# Patient Record
Sex: Female | Born: 1995 | Hispanic: Yes | Marital: Single | Smoking: Never smoker
Health system: Southern US, Community
[De-identification: ages and names within clinical notes are randomized; demographics above are authoritative.]

## PROBLEM LIST (undated history)

## (undated) DIAGNOSIS — E559 Vitamin D deficiency, unspecified: Secondary | ICD-10-CM

## (undated) DIAGNOSIS — R519 Headache, unspecified: Secondary | ICD-10-CM

## (undated) DIAGNOSIS — R7303 Prediabetes: Secondary | ICD-10-CM

## (undated) DIAGNOSIS — I10 Essential (primary) hypertension: Secondary | ICD-10-CM

## (undated) DIAGNOSIS — E7800 Pure hypercholesterolemia, unspecified: Secondary | ICD-10-CM

## (undated) DIAGNOSIS — Z789 Other specified health status: Secondary | ICD-10-CM

## (undated) DIAGNOSIS — K760 Fatty (change of) liver, not elsewhere classified: Secondary | ICD-10-CM

## (undated) DIAGNOSIS — D649 Anemia, unspecified: Secondary | ICD-10-CM

## (undated) HISTORY — PX: NO PAST SURGERIES: SHX2092

## (undated) HISTORY — DX: Anemia, unspecified: D64.9

## (undated) HISTORY — DX: Fatty (change of) liver, not elsewhere classified: K76.0

## (undated) HISTORY — DX: Prediabetes: R73.03

## (undated) HISTORY — DX: Pure hypercholesterolemia, unspecified: E78.00

## (undated) HISTORY — DX: Vitamin D deficiency, unspecified: E55.9

## (undated) HISTORY — DX: Other specified health status: Z78.9

## (undated) HISTORY — PX: TYMPANOSTOMY TUBE PLACEMENT: SHX32

---

## 2018-03-22 ENCOUNTER — Telehealth: Payer: Self-pay | Admitting: General Practice

## 2018-03-22 DIAGNOSIS — O3680X0 Pregnancy with inconclusive fetal viability, not applicable or unspecified: Secondary | ICD-10-CM

## 2018-03-22 NOTE — Telephone Encounter (Signed)
Claris Gower from [facility] called and left message on nurse voicemail line stating the patient came in today for a UPT, which was positive. LMP 01/14/18. She was unable to visualize anything on ultrasound. The patient has not had bleeding/pain. She is calling to schedule an ultrasound appt for the patient.   Called patient, no answer- left message to call us back.   Patient returned call & ultrasound was scheduled for 2/5 @ 2pm. Patient informed & had no questions. Patient denies pain or bleeding.

## 2018-03-22 NOTE — Telephone Encounter (Signed)
Opened in error

## 2018-03-24 ENCOUNTER — Ambulatory Visit (INDEPENDENT_AMBULATORY_CARE_PROVIDER_SITE_OTHER): Payer: PRIVATE HEALTH INSURANCE | Admitting: *Deleted

## 2018-03-24 ENCOUNTER — Ambulatory Visit (HOSPITAL_COMMUNITY)
Admission: RE | Admit: 2018-03-24 | Discharge: 2018-03-24 | Disposition: A | Discharge status: Home / Self Care | Payer: 59 | Source: Ambulatory Visit | Attending: Medical | Admitting: Medical

## 2018-03-24 DIAGNOSIS — O3680X0 Pregnancy with inconclusive fetal viability, not applicable or unspecified: Secondary | ICD-10-CM

## 2018-03-24 NOTE — Progress Notes (Signed)
Here for [place] results. States when she went to [facility] she forgot she also had had a period around 12/ 29/19 9 she had told them a November period). She denies pain or bleeding. Reviewed results with Dr. Jolayne Panther and notified patient US shows what appears to be very early pregnancy but we can not confirm it is a live pregnancy because we cannot see baby yet. Informed her we recommend non stat bhcg today to get a baseline and an [place] in 2 weeks. She will come to office afterwards for results. Ectopic precautions given.  Linda,RN

## 2018-03-24 NOTE — Progress Notes (Signed)
I have reviewed the chart and agree with nursing staff's documentation of this patient's encounter.  Catalina Antigua, MD 03/24/2018 4:29 PM

## 2018-03-25 ENCOUNTER — Telehealth: Payer: Self-pay | Admitting: *Deleted

## 2018-03-25 LAB — BETA HCG QUANT (REF LAB): hCG Quant: 822 m[IU]/mL

## 2018-03-25 NOTE — Telephone Encounter (Signed)
Baby left a message she is calling about blood test results.

## 2018-03-25 NOTE — Telephone Encounter (Signed)
I called Krystal Hudson back and informed her that her bhcg was 20 yesterday which may indicate very early pregnancy. We recommend she have [place] as scheduled in 2 weeks to see if we can see a live baby at that time. She voices understanding.

## 2018-04-07 ENCOUNTER — Ambulatory Visit (HOSPITAL_COMMUNITY): Admission: RE | Admit: 2018-04-07 | Payer: PRIVATE HEALTH INSURANCE | Source: Ambulatory Visit

## 2018-04-07 ENCOUNTER — Ambulatory Visit: Payer: PRIVATE HEALTH INSURANCE

## 2018-04-07 ENCOUNTER — Other Ambulatory Visit (HOSPITAL_COMMUNITY): Payer: Self-pay | Admitting: Obstetrics & Gynecology

## 2018-04-07 DIAGNOSIS — Z3200 Encounter for pregnancy test, result unknown: Secondary | ICD-10-CM | POA: Diagnosis not present

## 2018-04-07 DIAGNOSIS — Z3009 Encounter for other general counseling and advice on contraception: Secondary | ICD-10-CM | POA: Diagnosis not present

## 2018-04-29 ENCOUNTER — Encounter: Payer: Self-pay | Admitting: General Practice

## 2018-05-03 ENCOUNTER — Telehealth: Payer: Self-pay | Admitting: *Deleted

## 2018-05-03 ENCOUNTER — Other Ambulatory Visit (HOSPITAL_COMMUNITY)
Admission: RE | Admit: 2018-05-03 | Discharge: 2018-05-03 | Disposition: A | Discharge status: Home / Self Care | Payer: Medicaid Other | Source: Ambulatory Visit | Attending: Obstetrics and Gynecology | Admitting: Obstetrics and Gynecology

## 2018-05-03 ENCOUNTER — Other Ambulatory Visit: Payer: Self-pay

## 2018-05-03 ENCOUNTER — Ambulatory Visit (INDEPENDENT_AMBULATORY_CARE_PROVIDER_SITE_OTHER): Payer: PRIVATE HEALTH INSURANCE | Admitting: *Deleted

## 2018-05-03 DIAGNOSIS — Z3400 Encounter for supervision of normal first pregnancy, unspecified trimester: Secondary | ICD-10-CM | POA: Insufficient documentation

## 2018-05-03 NOTE — Progress Notes (Signed)
   PRENATAL INTAKE SUMMARY  [name] presents today New OB Nurse Interview.  OB History    Gravida  1   Para      Term      Preterm      AB      Living        SAB      TAB      Ectopic      Multiple      Live Births             I have reviewed the patient's medical, obstetrical, social, and family histories, medications, and available lab results.  SUBJECTIVE She has no unusual complaints.  OBJECTIVE Initial nurse interview for history and lab work (New OB).  EDD: 11/21/2018 by LMP GA:[redacted]w[redacted]d G1P0 FHT: 173  GENERAL APPEARANCE: alert, well appearing, in no apparent distress, oriented to person, place and time.   ASSESSMENT Normal pregnancy  PLAN Prenatal care-[facility]/HIV  OB Urine Culture GC/CT (urine) HgbEval/SMA/CF ([facility]) at next visit Panorama at next visit  A1C Glucose  Continue PNV Ultrasound <14 weeks to confirm dating  Clovis Pu, RN

## 2018-05-03 NOTE — Telephone Encounter (Signed)
Left voice message for patient informing her as part of the COVID-19 precautions, our office are only allowing patients to bring 1 person to their visit.  Clovis Pu, RN

## 2018-05-03 NOTE — Patient Instructions (Addendum)
First Trimester of Pregnancy  The first trimester of pregnancy is from week 1 until the end of week 13 (months 1 through 3). During this time, your baby will begin to develop inside you. At 6-8 weeks, the eyes and face are formed, and the heartbeat can be seen on ultrasound. At the end of 12 weeks, all the baby's organs are formed. Prenatal care is all the medical care you receive before the birth of your baby. Make sure you get good prenatal care and follow all of your doctor's instructions. Follow these instructions at home: Medicines  Take over-the-counter and prescription medicines only as told by your doctor. Some medicines are safe and some medicines are not safe during pregnancy.  Take a prenatal vitamin that contains at least 600 micrograms (mcg) of folic acid.  If you have trouble pooping (constipation), take medicine that will make your stool soft (stool softener) if your doctor approves. Eating and drinking   Eat regular, healthy meals.  Your doctor will tell you the amount of weight gain that is right for you.  Avoid raw meat and uncooked cheese.  If you feel sick to your stomach (nauseous) or throw up (vomit): ? Eat 4 or 5 small meals a day instead of 3 large meals. ? Try eating a few soda crackers. ? Drink liquids between meals instead of during meals.  To prevent constipation: ? Eat foods that are high in fiber, like fresh fruits and vegetables, whole grains, and beans. ? Drink enough fluids to keep your pee (urine) clear or pale yellow. Activity  Exercise only as told by your doctor. Stop exercising if you have cramps or pain in your lower belly (abdomen) or low back.  Do not exercise if it is too hot, too humid, or if you are in a place of great height (high altitude).  Try to avoid standing for long periods of time. Move your legs often if you must stand in one place for a long time.  Avoid heavy lifting.  Wear low-heeled shoes. Sit and stand up straight.   You can have sex unless your doctor tells you not to. Relieving pain and discomfort  Wear a good support bra if your breasts are sore.  Take warm water baths (sitz baths) to soothe pain or discomfort caused by hemorrhoids. Use hemorrhoid cream if your doctor says it is okay.  Rest with your legs raised if you have leg cramps or low back pain.  If you have puffy, bulging veins (varicose veins) in your legs: ? Wear support hose or compression stockings as told by your doctor. ? Raise (elevate) your feet for 15 minutes, 3-4 times a day. ? Limit salt in your food. Prenatal care  Schedule your prenatal visits by the twelfth week of pregnancy.  Write down your questions. Take them to your prenatal visits.  Keep all your prenatal visits as told by your doctor. This is important. Safety  Wear your seat belt at all times when driving.  Make a list of emergency phone numbers. The list should include numbers for family, friends, the hospital, and police and fire departments. General instructions  Ask your doctor for a referral to a local prenatal class. Begin classes no later than at the start of month 6 of your pregnancy.  Ask for help if you need counseling or if you need help with nutrition. Your doctor can give you advice or tell you where to go for help.  Do not use hot  tubs, steam rooms, or saunas.  Do not douche or use tampons or scented sanitary pads.  Do not cross your legs for long periods of time.  Avoid all herbs and alcohol. Avoid drugs that are not approved by your doctor.  Do not use any tobacco products, including cigarettes, chewing tobacco, and electronic cigarettes. If you need help quitting, ask your doctor. You may get counseling or other support to help you quit.  Avoid cat litter boxes and soil used by cats. These carry germs that can cause birth defects in the baby and can cause a loss of your baby (miscarriage) or stillbirth.  Visit your dentist. At home,  brush your teeth with a soft toothbrush. Be gentle when you floss. Contact a doctor if:  You are dizzy.  You have mild cramps or pressure in your lower belly.  You have a nagging pain in your belly area.  You continue to feel sick to your stomach, you throw up, or you have watery poop (diarrhea).  You have a bad smelling fluid coming from your vagina.  You have pain when you pee (urinate).  You have increased puffiness (swelling) in your face, hands, legs, or ankles. Get help right away if:  You have a fever.  You are leaking fluid from your vagina.  You have spotting or bleeding from your vagina.  You have very bad belly cramping or pain.  You gain or lose weight rapidly.  You throw up blood. It may look like coffee grounds.  You are around people who have [place] measles, fifth disease, or chickenpox.  You have a very bad headache.  You have shortness of breath.  You have any kind of trauma, such as from a fall or a car accident. Summary  The first trimester of pregnancy is from week 1 until the end of week 13 (months 1 through 3).  To take care of yourself and your unborn baby, you will need to eat healthy meals, take medicines only if your doctor tells you to do so, and do activities that are safe for you and your baby.  Keep all follow-up visits as told by your doctor. This is important as your doctor will have to ensure that your baby is healthy and growing well. This information is not intended to replace advice given to you by your health care provider. Make sure you discuss any questions you have with your health care provider. Document Released: 07/23/2007 Document Revised: 02/12/2016 Document Reviewed: 02/12/2016 Elsevier Interactive Patient Education  2019 [employer].  Warning Signs During Pregnancy A pregnancy lasts about 40 weeks, starting from the first day of your last period until the baby is born. Pregnancy is divided into three phases called  trimesters.  The first trimester refers to week 1 through week 13 of pregnancy.  The second trimester is the start of week 14 through the end of week 27.  The third trimester is the start of week 28 until you deliver your baby. During each trimester of pregnancy, certain signs and symptoms may indicate a problem. Talk with your health care provider about your current health and any medical conditions you have. Make sure you know the symptoms that you should watch for and report. How does this affect me?  Warning signs in the first trimester While some changes during the first trimester may be uncomfortable, most do not represent a serious problem. Let your health care provider know if you have any of the following warning signs in  the first trimester:  You cannot eat or drink without vomiting, and this lasts for longer than a day.  You have vaginal bleeding or spotting along with menstrual-like cramping.  You have diarrhea for longer than a day.  You have a fever or other signs of infection, such as: ? Pain or burning when you urinate. ? Foul smelling or thick or yellowish vaginal discharge. Warning signs in the second trimester As your baby grows and changes during the second trimester, there are additional signs and symptoms that may indicate a problem. These include:  Signs and symptoms of infection, including a fever.  Signs or symptoms of a miscarriage or preterm labor, such as regular contractions, menstrual-like cramping, or lower abdominal pain.  Bloody or watery vaginal discharge or obvious vaginal bleeding.  Feeling like your heart is pounding.  Having trouble breathing.  Nausea, vomiting, or diarrhea that lasts for longer than a day.  Craving non-food items, such as clay, chalk, or dirt. This may be a sign of a very treatable medical condition called pica. Later in your second trimester, watch for signs and symptoms of a serious medical condition called  preeclampsia.These include:  Changes in your vision.  A severe headache that does not go away.  Nausea and vomiting. It is also important to notice if your baby stops moving or moves less than usual during this time. Warning signs in the third trimester As you approach the third trimester, your baby is growing and your body is preparing for the birth of your baby. In your third trimester, be sure to let your health care provider know if:  You have signs and symptoms of infection, including a fever.  You have vaginal bleeding.  You notice that your baby is moving less than usual or is not moving.  You have nausea, vomiting, or diarrhea that lasts for longer than a day.  You have a severe headache that does not go away.  You have vision changes, including seeing spots or having blurry or double vision.  You have increased swelling in your hands or face. How does this affect my baby? Throughout your pregnancy, always report any of the warning signs of a problem to your health care provider. This can help prevent complications that may affect your baby, including:  Increased risk for premature birth.  Infection that may be transmitted to your baby.  Increased risk for stillbirth. Contact a health care provider if:  You have any of the warning signs of a problem for the current trimester of your pregnancy.  Any of the following apply to you during any trimester of pregnancy: ? You have strong emotions, such as sadness or anxiety, that interfere with work or personal relationships. ? You feel unsafe in your home and need help finding a safe place to live. ? You are using tobacco products, alcohol, or drugs and you need help to stop. Get help right away if: You have signs or symptoms of labor before 37 weeks of pregnancy. These include:  Contractions that are 5 minutes or less apart, or that increase in frequency, intensity, or length.  Sudden, sharp abdominal pain or low back  pain.  Uncontrolled gush or trickle of fluid from your vagina. Summary  A pregnancy lasts about 40 weeks, starting from the first day of your last period until the baby is born. Pregnancy is divided into three phases called trimesters. Each trimester has warning signs to watch for.  Always report any warning signs to your  health care provider in order to prevent complications that may affect both you and your baby.  Talk with your health care provider about your current health and any medical conditions you have. Make sure you know the symptoms that you should watch for and report. This information is not intended to replace advice given to you by your health care provider. Make sure you discuss any questions you have with your health care provider. Document Released: 11/20/2016 Document Revised: 11/20/2016 Document Reviewed: 11/20/2016 Elsevier Interactive Patient Education  2019 [employer]  

## 2018-05-04 LAB — OBSTETRIC PANEL, INCLUDING HIV
Antibody Screen: NEGATIVE
Basophils Absolute: 0 10*3/uL (ref 0.0–0.2)
Basos: 1 %
EOS (ABSOLUTE): 0.1 10*3/uL (ref 0.0–0.4)
Eos: 1 %
HIV Screen 4th Generation wRfx: NONREACTIVE
Hematocrit: 33.6 % — ABNORMAL LOW (ref 34.0–46.6)
Hemoglobin: 11.3 g/dL (ref 11.1–15.9)
Hepatitis B Surface Ag: NEGATIVE
Immature Grans (Abs): 0 10*3/uL (ref 0.0–0.1)
Immature Granulocytes: 1 %
Lymphocytes Absolute: 2 10*3/uL (ref 0.7–3.1)
Lymphs: 35 %
MCH: 28.1 pg (ref 26.6–33.0)
MCHC: 33.6 g/dL (ref 31.5–35.7)
MCV: 84 fL (ref 79–97)
Monocytes Absolute: 0.5 10*3/uL (ref 0.1–0.9)
Monocytes: 8 %
Neutrophils Absolute: 3 10*3/uL (ref 1.4–7.0)
Neutrophils: 54 %
PLATELETS: 288 10*3/uL (ref 150–450)
RBC: 4.02 x10E6/uL (ref 3.77–5.28)
RDW: 13.5 % (ref 11.7–15.4)
RPR: NONREACTIVE
Rh Factor: POSITIVE
Rubella Antibodies, IGG: 2.22 index (ref >0.99)
WBC: 5.5 10*3/uL (ref 3.4–10.8)

## 2018-05-04 LAB — HEMOGLOBIN A1C
Est. average glucose Bld gHb Est-mCnc: 111 mg/dL
Hgb A1c MFr Bld: 5.5 % (ref 4.8–5.6)

## 2018-05-04 LAB — URINE CYTOLOGY ANCILLARY ONLY
Chlamydia: NEGATIVE
NEISSERIA GONORRHEA: NEGATIVE

## 2018-05-04 LAB — GLUCOSE, RANDOM: Glucose: 75 mg/dL (ref 65–99)

## 2018-05-05 LAB — CULTURE, OB URINE

## 2018-05-05 LAB — URINE CULTURE, OB REFLEX

## 2018-05-10 ENCOUNTER — Other Ambulatory Visit (HOSPITAL_COMMUNITY): Payer: Self-pay | Admitting: Obstetrics and Gynecology

## 2018-05-10 ENCOUNTER — Other Ambulatory Visit: Payer: Self-pay

## 2018-05-10 ENCOUNTER — Ambulatory Visit (HOSPITAL_COMMUNITY)
Admission: RE | Admit: 2018-05-10 | Discharge: 2018-05-10 | Disposition: A | Discharge status: Home / Self Care | Payer: 59 | Source: Ambulatory Visit | Attending: Obstetrics and Gynecology | Admitting: Obstetrics and Gynecology

## 2018-05-10 DIAGNOSIS — Z3401 Encounter for supervision of normal first pregnancy, first trimester: Secondary | ICD-10-CM

## 2018-05-10 DIAGNOSIS — Z3400 Encounter for supervision of normal first pregnancy, unspecified trimester: Secondary | ICD-10-CM | POA: Diagnosis not present

## 2018-05-10 DIAGNOSIS — Z3A12 12 weeks gestation of pregnancy: Secondary | ICD-10-CM

## 2018-05-17 ENCOUNTER — Ambulatory Visit (INDEPENDENT_AMBULATORY_CARE_PROVIDER_SITE_OTHER): Payer: PRIVATE HEALTH INSURANCE | Admitting: Primary Care

## 2018-05-19 ENCOUNTER — Encounter: Payer: Self-pay | Admitting: Obstetrics and Gynecology

## 2018-05-19 ENCOUNTER — Other Ambulatory Visit (HOSPITAL_COMMUNITY)
Admission: RE | Admit: 2018-05-19 | Discharge: 2018-05-19 | Disposition: A | Discharge status: Home / Self Care | Payer: Medicaid Other | Source: Ambulatory Visit | Attending: Obstetrics and Gynecology | Admitting: Obstetrics and Gynecology

## 2018-05-19 ENCOUNTER — Ambulatory Visit (INDEPENDENT_AMBULATORY_CARE_PROVIDER_SITE_OTHER): Payer: 59 | Admitting: Obstetrics and Gynecology

## 2018-05-19 ENCOUNTER — Other Ambulatory Visit: Payer: Self-pay

## 2018-05-19 VITALS — BP 115/72 | HR 85 | Temp 98.0°F | Wt 253.0 lb

## 2018-05-19 DIAGNOSIS — Z3400 Encounter for supervision of normal first pregnancy, unspecified trimester: Secondary | ICD-10-CM

## 2018-05-19 DIAGNOSIS — Z3481 Encounter for supervision of other normal pregnancy, first trimester: Secondary | ICD-10-CM | POA: Diagnosis not present

## 2018-05-19 DIAGNOSIS — Z124 Encounter for screening for malignant neoplasm of cervix: Secondary | ICD-10-CM

## 2018-05-19 DIAGNOSIS — Z3A13 13 weeks gestation of pregnancy: Secondary | ICD-10-CM

## 2018-05-19 DIAGNOSIS — Z13228 Encounter for screening for other metabolic disorders: Secondary | ICD-10-CM | POA: Diagnosis not present

## 2018-05-19 DIAGNOSIS — Z3401 Encounter for supervision of normal first pregnancy, first trimester: Secondary | ICD-10-CM

## 2018-05-19 NOTE — Patient Instructions (Signed)
Healthy Weight Gain During Pregnancy, Adult A certain amount of weight gain during pregnancy is normal and healthy. How much weight you should gain depends on your overall health and a measurement called BMI (body mass index). BMI is an estimate of your body fat based on your height and weight. You can use an online calculator to figure out your BMI, or you can ask your health care provider to calculate it for you at your next visit. Your recommended pregnancy weight gain is based on your pre-pregnancy BMI. General guidelines for a healthy total weight gain during pregnancy are listed below. If your BMI at or before the start of your pregnancy is:  Less than 18.5 (underweight), you should gain 28-40 lb (13-18 kg).  18.5-24.9 (normal weight), you should gain 25-35 lb (11-16 kg).  25-29.9 (overweight), you should gain 15-25 lb (7-11 kg).  30 or higher (obese), you should gain 11-20 lb (5-9 kg). These ranges vary depending on your individual health. If you are carrying more than one baby (multiples), it may be safe to gain more weight than these recommendations. If you gain less weight than recommended, that may be safe as long as your baby is growing and developing normally. How can unhealthy weight gain affect me and my baby? Gaining too much weight during pregnancy can lead to pregnancy complications, such as:  A temporary form of diabetes that develops during pregnancy (gestational diabetes).  High blood pressure during pregnancy and protein in your urine (preeclampsia).  High blood pressure during pregnancy without protein in your urine (gestational hypertension).  Your baby having a high weight at birth, which may: ? Raise your risk of having a more difficult delivery or a surgical delivery (cesarean delivery, or C-section). ? Raise your child's risk of developing obesity during childhood. Not gaining enough weight can be life-threatening for your baby, and it may raise your baby's chances  of:  Being born early (preterm).  Growing more slowly than normal during pregnancy (growth restriction).  Having a low weight at birth. What actions can I take to gain a healthy amount of weight during pregnancy? General instructions  Keep track of your weight gain during pregnancy.  Take over-the-counter and prescription medicines only as told by your health care provider. Take all prenatal supplements as directed.  Keep all health care visits during pregnancy (prenatal visits). These visits are a good time to discuss your weight gain. Your health care provider will weigh you at each visit to make sure you are gaining a healthy amount of weight. Nutrition   Eat a balanced, nutrient-rich diet. Eat plenty of: ? Fruits and vegetables, such as berries and broccoli. ? Whole grains, such as millet, barley, whole-wheat breads and cereals, and oatmeal. ? Low-fat dairy products or non-dairy products such as almond milk or rice milk. ? Protein foods, such as lean meat, chicken, eggs, and legumes (such as peas, beans, soybeans, and lentils).  Avoid foods that are fried or have a lot of fat, salt (sodium), or sugar.  Drink enough fluid to keep your urine pale yellow.  Choose healthy snack and drink options when you are at work or on the go: ? Drink water. Avoid soda, sports drinks, and juices that have added sugar. ? Avoid drinks with caffeine, such as coffee and energy drinks. ? Eat snacks that are high in protein, such as nuts, protein bars, and low-fat yogurt. ? Carry convenient snacks in your purse that do not need refrigeration, such as a pack of   trail mix, an apple, or a granola bar.  If you need help improving your diet, work with a health care provider or a diet and nutrition specialist (dietitian). Activity   Exercise regularly, as told by your health care provider. ? If you were active before becoming pregnant, you may be able to continue your regular fitness activities. ? If  you were not active before pregnancy, you may gradually build up to exercising for 30 or more minutes on most days of the week. This may include walking, swimming, or yoga.  Ask your health care provider what activities are safe for you. Talk with your health care provider about whether you may need to be excused from certain school or work activities. Where to find more information Learn more about managing your weight gain during pregnancy from:  American Pregnancy Association: www.americanpregnancy.org  U.[place]. Department of Agriculture pregnancy weight gain calculator: https://ball-collins.biz/ Summary  Too much weight gain during pregnancy can lead to complications for you and your baby.  Find out your pre-pregnancy BMI to determine how much weight gain is healthy for you.  Eat nutritious foods and stay active.  Keep all of your prenatal visits as told by your health care provider. This information is not intended to replace advice given to you by your health care provider. Make sure you discuss any questions you have with your health care provider. Document Released: 10/24/2016 Document Revised: 10/24/2016 Document Reviewed: 10/24/2016 Elsevier Interactive Patient Education  2019 [employer]. Second Trimester of Pregnancy  The second trimester is from week 14 through week 27 (month 4 through 6). This is often the time in pregnancy that you feel your best. Often times, morning sickness has lessened or quit. You may have more energy, and you may get hungry more often. Your unborn baby is growing rapidly. At the end of the sixth month, he or she is about 9 inches long and weighs about 1 pounds. You will likely feel the baby move between 18 and 20 weeks of pregnancy. Follow these instructions at home: Medicines  Take over-the-counter and prescription medicines only as told by your doctor. Some medicines are safe and some medicines are not safe during pregnancy.  Take a prenatal vitamin  that contains at least 600 micrograms (mcg) of folic acid.  If you have trouble pooping (constipation), take medicine that will make your stool soft (stool softener) if your doctor approves. Eating and drinking   Eat regular, healthy meals.  Avoid raw meat and uncooked cheese.  If you get low calcium from the food you eat, talk to your doctor about taking a daily calcium supplement.  Avoid foods that are high in fat and sugars, such as fried and sweet foods.  If you feel sick to your stomach (nauseous) or throw up (vomit): ? Eat 4 or 5 small meals a day instead of 3 large meals. ? Try eating a few soda crackers. ? Drink liquids between meals instead of during meals.  To prevent constipation: ? Eat foods that are high in fiber, like fresh fruits and vegetables, whole grains, and beans. ? Drink enough fluids to keep your pee (urine) clear or pale yellow. Activity  Exercise only as told by your doctor. Stop exercising if you start to have cramps.  Do not exercise if it is too hot, too humid, or if you are in a place of great height (high altitude).  Avoid heavy lifting.  Wear low-heeled shoes. Sit and stand up straight.  You can continue  to have sex unless your doctor tells you not to. Relieving pain and discomfort  Wear a good support bra if your breasts are tender.  Take warm water baths (sitz baths) to soothe pain or discomfort caused by hemorrhoids. Use hemorrhoid cream if your doctor approves.  Rest with your legs raised if you have leg cramps or low back pain.  If you develop puffy, bulging veins (varicose veins) in your legs: ? Wear support hose or compression stockings as told by your doctor. ? Raise (elevate) your feet for 15 minutes, 3-4 times a day. ? Limit salt in your food. Prenatal care  Write down your questions. Take them to your prenatal visits.  Keep all your prenatal visits as told by your doctor. This is important. Safety  Wear your seat belt when  driving.  Make a list of emergency phone numbers, including numbers for family, friends, the hospital, and police and fire departments. General instructions  Ask your doctor about the right foods to eat or for help finding a counselor, if you need these services.  Ask your doctor about local prenatal classes. Begin classes before month 6 of your pregnancy.  Do not use hot tubs, steam rooms, or saunas.  Do not douche or use tampons or scented sanitary pads.  Do not cross your legs for long periods of time.  Visit your dentist if you have not done so. Use a soft toothbrush to brush your teeth. Floss gently.  Avoid all smoking, herbs, and alcohol. Avoid drugs that are not approved by your doctor.  Do not use any products that contain nicotine or tobacco, such as cigarettes and e-cigarettes. If you need help quitting, ask your doctor.  Avoid cat litter boxes and soil used by cats. These carry germs that can cause birth defects in the baby and can cause a loss of your baby (miscarriage) or stillbirth. Contact a doctor if:  You have mild cramps or pressure in your lower belly.  You have pain when you pee (urinate).  You have bad smelling fluid coming from your vagina.  You continue to feel sick to your stomach (nauseous), throw up (vomit), or have watery poop (diarrhea).  You have a nagging pain in your belly area.  You feel dizzy. Get help right away if:  You have a fever.  You are leaking fluid from your vagina.  You have spotting or bleeding from your vagina.  You have severe belly cramping or pain.  You lose or gain weight rapidly.  You have trouble catching your breath and have chest pain.  You notice sudden or extreme puffiness (swelling) of your face, hands, ankles, feet, or legs.  You have not felt the baby move in over an hour.  You have severe headaches that do not go away when you take medicine.  You have trouble seeing. Summary  The second trimester is  from week 14 through week 27 (months 4 through 6). This is often the time in pregnancy that you feel your best.  To take care of yourself and your unborn baby, you will need to eat healthy meals, take medicines only if your doctor tells you to do so, and do activities that are safe for you and your baby.  Call your doctor if you get sick or if you notice anything unusual about your pregnancy. Also, call your doctor if you need help with the right food to eat, or if you want to know what activities are safe for you. This  information is not intended to replace advice given to you by your health care provider. Make sure you discuss any questions you have with your health care provider. Document Released: 04/30/2009 Document Revised: 03/11/2016 Document Reviewed: 03/11/2016 Elsevier Interactive Patient Education  2019 [employer]. Eating Plan for Pregnant Women While you are pregnant, your body requires additional nutrition to help support your growing baby. You also have a higher need for some vitamins and minerals, such as folic acid, calcium, iron, and vitamin D. Eating a healthy, well-balanced diet is very important for your health and your baby's health. Your need for extra calories varies for the three 61-month segments of your pregnancy (trimesters). For most women, it is recommended to consume:  150 extra calories a day during the first trimester.  300 extra calories a day during the second trimester.  300 extra calories a day during the third trimester. What are tips for following this plan?   Do not try to lose weight or go on a diet during pregnancy.  Limit your overall intake of foods that have "empty calories." These are foods that have little nutritional value, such as sweets, desserts, candies, and sugar-sweetened beverages.  Eat a variety of foods (especially fruits and vegetables) to get a full range of vitamins and minerals.  Take a prenatal vitamin to help meet your  additional vitamin and mineral needs during pregnancy, specifically for folic acid, iron, calcium, and vitamin D.  Remember to stay active. Ask your health care provider what types of exercise and activities are safe for you.  Practice good food safety and cleanliness. Wash your hands before you eat and after you prepare raw meat. Wash all fruits and vegetables well before peeling or eating. Taking these actions can help to prevent food-borne illnesses that can be very dangerous to your baby, such as listeriosis. Ask your health care provider for more information about listeriosis. What does 150 extra calories look like? Healthy options that provide 150 extra calories each day could be any of the following:  6-8 oz (170-230 g) of plain low-fat yogurt with  cup of berries.  1 apple with 2 teaspoons (11 g) of peanut butter.  Cut-up vegetables with  cup (60 g) of hummus.  8 oz (230 mL) or 1 cup of low-fat chocolate milk.  1 stick of string cheese with 1 medium orange.  1 peanut butter and jelly sandwich that is made with one slice of whole-wheat bread and 1 tsp (5 g) of peanut butter. For 300 extra calories, you could eat two of those healthy options each day. What is a healthy amount of weight to gain? The right amount of weight gain for you is based on your BMI before you became pregnant. If your BMI:  Was less than 18 (underweight), you should gain 28-40 lb (13-18 kg).  Was 18-24.9 (normal), you should gain 25-35 lb (11-16 kg).  Was 25-29.9 (overweight), you should gain 15-25 lb (7-11 kg).  Was 30 or greater (obese), you should gain 11-20 lb (5-9 kg). What if I am having twins or multiples? Generally, if you are carrying twins or multiples:  You may need to eat 300-600 extra calories a day.  The recommended range for total weight gain is 25-54 lb (11-25 kg), depending on your BMI before pregnancy.  Talk with your health care provider to find out about nutritional needs, weight  gain, and exercise that is right for you. What foods can I eat?  Grains All grains. Choose whole  grains, such as whole-wheat bread, oatmeal, or brown rice. Vegetables All vegetables. Eat a variety of colors and types of vegetables. Remember to wash your vegetables well before peeling or eating. Fruits All fruits. Eat a variety of colors and types of fruit. Remember to wash your fruits well before peeling or eating. Meats and other protein foods Lean meats, including chicken, [place], fish, and lean cuts of beef, veal, or pork. If you eat fish or seafood, choose options that are higher in omega-3 fatty acids and lower in mercury, such as salmon, herring, mussels, trout, sardines, pollock, shrimp, crab, and lobster. Tofu. Tempeh. Beans. Eggs. Peanut butter and other nut butters. Make sure that all meats, poultry, and eggs are cooked to food-safe temperatures or "well-done." Two or more servings of fish are recommended each week in order to get the most benefits from omega-3 fatty acids that are found in seafood. Choose fish that are lower in mercury. You can find more information online:  PumpkinSearch.com.eewww.fda.gov Dairy Pasteurized milk and milk alternatives (such as almond milk). Pasteurized yogurt and pasteurized cheese. Cottage cheese. Sour cream. Beverages Water. Juices that contain 100% fruit juice or vegetable juice. Caffeine-free teas and decaffeinated coffee. Drinks that contain caffeine are okay to drink, but it is better to avoid caffeine. Keep your total caffeine intake to less than 200 mg each day (which is 12 oz or 355 mL of coffee, tea, or soda) or the limit as told by your health care provider. Fats and oils Fats and oils are okay to include in moderation. Sweets and desserts Sweets and desserts are okay to include in moderation. Seasoning and other foods All pasteurized condiments. The items listed above may not be a complete list of recommended foods and beverages. Contact your dietitian for  more options. What foods are not recommended? Vegetables Raw (unpasteurized) vegetable juices. Fruits Unpasteurized fruit juices. Meats and other protein foods Lunch meats, bologna, hot dogs, or other deli meats. (If you must eat those meats, reheat them until they are steaming hot.) Refrigerated pat, meat spreads from a meat counter, smoked seafood that is found in the refrigerated section of a store. Raw or undercooked meats, poultry, and eggs. Raw fish, such as sushi or sashimi. Fish that have high mercury content, such as tilefish, shark, swordfish, and king mackerel. To learn more about mercury in fish, talk with your health care provider or look for online resources, such as:  PumpkinSearch.com.eewww.fda.gov Dairy Raw (unpasteurized) milk and any foods that have raw milk in them. Soft cheeses, such as feta, queso blanco, queso fresco, Brie, Camembert cheeses, blue-veined cheeses, and Panela cheese (unless it is made with pasteurized milk, which must be stated on the label). Beverages Alcohol. Sugar-sweetened beverages, such as sodas, teas, or energy drinks. Seasoning and other foods Homemade fermented foods and drinks, such as pickles, sauerkraut, or kombucha drinks. (Store-bought pasteurized versions of these are okay.) Salads that are made in a store or deli, such as ham salad, chicken salad, egg salad, tuna salad, and seafood salad. The items listed above may not be a complete list of foods and beverages to avoid. Contact your dietitian for more information. Where to find more information To calculate the number of calories you need based on your height, weight, and activity level, you can use an online calculator such as:  PackageNews.iswww.choosemyplate.gov/MyPlatePlan To calculate how much weight you should gain during pregnancy, you can use an online pregnancy weight gain calculator such as:  http://jones-berg.com/www.choosemyplate.gov/pregnancy-weight-gain-calculator Summary  While you are pregnant, your  body requires additional  nutrition to help support your growing baby.  Eat a variety of foods, especially fruits and vegetables to get a full range of vitamins and minerals.  Practice good food safety and cleanliness. Wash your hands before you eat and after you prepare raw meat. Wash all fruits and vegetables well before peeling or eating. Taking these actions can help to prevent food-borne illnesses, such as listeriosis, that can be very dangerous to your baby.  Do not eat raw meat or fish. Do not eat fish that have high mercury content, such as tilefish, shark, swordfish, and king mackerel. Do not eat unpasteurized (raw) dairy.  Take a prenatal vitamin to help meet your additional vitamin and mineral needs during pregnancy, specifically for folic acid, iron, calcium, and vitamin D. This information is not intended to replace advice given to you by your health care provider. Make sure you discuss any questions you have with your health care provider. Document Released: 11/18/2013 Document Revised: 10/31/2016 Document Reviewed: 10/31/2016 Elsevier Interactive Patient Education  2019 [employer].

## 2018-05-19 NOTE — Progress Notes (Signed)
  Subjective:    Krystal Hudson is being seen today for her first obstetrical visit.  This is a planned pregnancy. She is at [redacted]w[redacted]d gestation. Her obstetrical history is significant for obesity. Relationship with FOB: significant other, not living together. Patient does intend to breast feed. Pregnancy history fully reviewed.  Patient reports no complaints.  Review of Systems:   Review of Systems  Constitutional: Negative.   HENT: Negative.   Eyes: Negative.   Respiratory: Negative.   Cardiovascular: Negative.   Gastrointestinal: Negative.   Endocrine: Negative.   Genitourinary: Negative.   Musculoskeletal: Negative.   Skin: Negative.   Allergic/Immunologic: Negative.   Neurological: Negative.   Hematological: Negative.   Psychiatric/Behavioral: Negative.     Objective:     BP 115/72   Pulse 85   Temp 98 F (36.7 C)   Wt 253 lb (114.8 kg)   LMP 02/14/2018 (Exact Date)   BMI 43.43 kg/m    Physical Exam  Nursing note and vitals reviewed. Constitutional: She is oriented to person, place, and time. She appears well-developed and well-nourished.  HENT:  Head: Normocephalic and atraumatic.  Right Ear: External ear normal.  Left Ear: External ear normal.  Nose: Nose normal.  Mouth/Throat: Oropharynx is clear and moist.  Eyes: Pupils are equal, round, and reactive to light. Conjunctivae and EOM are normal.  Neck: Normal range of motion. Neck supple.  Cardiovascular: Normal rate, regular rhythm, normal heart sounds and intact distal pulses.  Respiratory: Effort normal and breath sounds normal.  GI: Soft. Bowel sounds are normal.  Genitourinary:    Vulva normal.     Vaginal discharge present.     Genitourinary Comments: Uterus: difficult to palpate d/t maternal habitus, SE: cervix is smooth, pink, no lesions, moderate amt of thick, frothy, white vaginal d/c -- Pap, GC/CT done, closed/long/firm, no CMT or friability, no adnexal tenderness    Musculoskeletal: Normal range of  motion.  Neurological: She is alert and oriented to person, place, and time. She has normal reflexes.  Skin: Skin is warm and dry.  Psychiatric: She has a normal mood and affect. Her behavior is normal. Judgment and thought content normal.    Maternal Exam:  Abdomen: Patient reports no abdominal tenderness. Introitus: Normal vulva. Vagina is positive for vaginal discharge.  Ferning test: not done.  Nitrazine test: not done. Amniotic fluid character: not assessed.  Pelvis: adequate for delivery.   Cervix: Cervix evaluated by sterile speculum exam and digital exam.     Fetal Exam Fetal Monitor Review: Mode: hand-held doppler probe.   Baseline rate: 161 bpm.       Assessment:    Pregnancy: G1P0 Patient Active Problem List   Diagnosis Date Noted  . Supervision of normal first pregnancy, antepartum 05/03/2018    Plan:    Initial lab results reviewed. Panorama drawn. Prenatal vitamins. Problem list reviewed and updated. AFP3 discussed: undecided. Role of ultrasound in pregnancy discussed; fetal survey: ordered. Amniocentesis discussed: not indicated. The nature of [facility] with multiple MDs and other Advanced Practice Providers was explained to patient; also emphasized that residents, students are part of our team.  Follow up in 6 weeks. 50% of 30 min visit spent on counseling and coordination of care.  Discussed optimized OB schedule / Webex / telehealth visits -- pt has access to smartphone and/or computer.  Raelyn Mora, MSN, CNM 05/19/2018

## 2018-05-20 LAB — CYTOLOGY - PAP
Adequacy: ABSENT
Diagnosis: NEGATIVE

## 2018-05-24 ENCOUNTER — Telehealth: Payer: Self-pay | Admitting: *Deleted

## 2018-05-24 ENCOUNTER — Encounter: Payer: Self-pay | Admitting: General Practice

## 2018-05-24 DIAGNOSIS — B373 Candidiasis of vulva and vagina: Secondary | ICD-10-CM

## 2018-05-24 DIAGNOSIS — B3731 Acute candidiasis of vulva and vagina: Secondary | ICD-10-CM

## 2018-05-24 DIAGNOSIS — O23599 Infection of other part of genital tract in pregnancy, unspecified trimester: Principal | ICD-10-CM

## 2018-05-24 DIAGNOSIS — B9689 Other specified bacterial agents as the cause of diseases classified elsewhere: Secondary | ICD-10-CM

## 2018-05-24 MED ORDER — TERCONAZOLE 0.4 % VA CREA: 1.0000 | TOPICAL_CREAM | Freq: Every day | VAGINAL | 0 refills | Status: DC

## 2018-05-24 MED ORDER — METRONIDAZOLE 500 MG PO TABS: 500.0000 mg | ORAL_TABLET | Freq: Two times a day (BID) | ORAL | 0 refills | Status: DC

## 2018-05-24 NOTE — Telephone Encounter (Signed)
Patient verified DOB. Patient of PAP result and need for treatment for bacterial vaginosis and yeast infection.  Clovis Pu, RN

## 2018-05-24 NOTE — Telephone Encounter (Signed)
-----   Message from [place], [place] sent at 05/20/2018  4:49 PM EDT ----- Treat for BV first then yeast following BV tx

## 2018-05-31 ENCOUNTER — Encounter: Payer: Self-pay | Admitting: General Practice

## 2018-06-29 ENCOUNTER — Other Ambulatory Visit: Payer: Self-pay | Admitting: Obstetrics and Gynecology

## 2018-06-29 ENCOUNTER — Ambulatory Visit (HOSPITAL_COMMUNITY)
Admission: RE | Admit: 2018-06-29 | Discharge: 2018-06-29 | Disposition: A | Discharge status: Home / Self Care | Payer: 59 | Source: Ambulatory Visit | Attending: Obstetrics and Gynecology | Admitting: Obstetrics and Gynecology

## 2018-06-29 ENCOUNTER — Other Ambulatory Visit: Payer: Self-pay

## 2018-06-29 ENCOUNTER — Other Ambulatory Visit (HOSPITAL_COMMUNITY): Payer: Self-pay | Admitting: *Deleted

## 2018-06-29 DIAGNOSIS — Z3A19 19 weeks gestation of pregnancy: Secondary | ICD-10-CM

## 2018-06-29 DIAGNOSIS — O99212 Obesity complicating pregnancy, second trimester: Secondary | ICD-10-CM

## 2018-06-29 DIAGNOSIS — Z3400 Encounter for supervision of normal first pregnancy, unspecified trimester: Secondary | ICD-10-CM | POA: Diagnosis present

## 2018-06-29 DIAGNOSIS — Z3689 Encounter for other specified antenatal screening: Secondary | ICD-10-CM

## 2018-06-29 DIAGNOSIS — Z363 Encounter for antenatal screening for malformations: Secondary | ICD-10-CM | POA: Diagnosis not present

## 2018-07-01 ENCOUNTER — Encounter: Payer: Self-pay | Admitting: Obstetrics and Gynecology

## 2018-07-01 ENCOUNTER — Other Ambulatory Visit: Payer: Self-pay

## 2018-07-01 ENCOUNTER — Ambulatory Visit (INDEPENDENT_AMBULATORY_CARE_PROVIDER_SITE_OTHER): Payer: 59 | Admitting: Obstetrics and Gynecology

## 2018-07-01 DIAGNOSIS — Z3402 Encounter for supervision of normal first pregnancy, second trimester: Secondary | ICD-10-CM

## 2018-07-01 DIAGNOSIS — Z3400 Encounter for supervision of normal first pregnancy, unspecified trimester: Secondary | ICD-10-CM

## 2018-07-01 DIAGNOSIS — Z3A19 19 weeks gestation of pregnancy: Secondary | ICD-10-CM

## 2018-07-01 NOTE — Progress Notes (Signed)
   WEBEX VIRTUAL OBSTETRICS VISIT ENCOUNTER NOTE  I connected with Krystal Hudson on 07/01/18 at  2:10 PM EDT by Webex at home and verified that I am speaking with the correct person using two identifiers.   I discussed the limitations, risks, security and privacy concerns of performing an evaluation and management service by Webex and the availability of in person appointments. I also discussed with the patient that there may be a patient responsible charge related to this service. The patient expressed understanding and agreed to proceed.  Subjective:  Krystal Hudson is a 23 y.o. G1P0 at [redacted]w[redacted]d being followed for ongoing prenatal care.  She is currently monitored for the following issues for this low-risk pregnancy and has Supervision of normal first pregnancy, antepartum on their problem list.  Patient reports "always feeling cold". Reports fetal movement. Denies any contractions, bleeding or leaking of fluid.   The following portions of the patient's history were reviewed and updated as appropriate: allergies, current medications, past family history, past medical history, past social history, past surgical history and problem list.   Objective:   General:  Alert, oriented and cooperative.   Mental Status: Normal mood and affect perceived. Normal judgment and thought content.  Rest of physical exam deferred due to type of encounter BP 98/66   Pulse 86   Wt 265 lb (120.2 kg)   LMP 02/14/2018 (Exact Date)   BMI 45.49 kg/m  VS taken by patient at home  Assessment and Plan:  Pregnancy: G1P0 at [redacted]w[redacted]d 1. Supervision of normal first pregnancy, antepartum - Reviewed U/S -- WNL with exception of some suboptimal views d/t fetal position  scheduled for F/U U/S in 4-5 wks - Reassurance given that HgB was WNL - Will provide iron rich diet instructions via MyChart  Term labor symptoms and general obstetric precautions including but not limited to vaginal bleeding, contractions, leaking of  fluid and fetal movement were reviewed in detail with the patient.  I discussed the assessment and treatment plan with the patient. The patient was provided an opportunity to ask questions and all were answered. The patient agreed with the plan and demonstrated an understanding of the instructions. The patient was advised to call back or seek an in-person office evaluation/go to MAU at [facility] for any urgent or concerning symptoms. Please refer to After Visit Summary for other counseling recommendations.   I provided 5 minutes of non-face-to-face time during this encounter. There was 5 minutes of chart review time spent prior to this encounter. Total time spent = 10 minutes.   Return in about 23 weeks (around 08/05/2018) for Return OB - webex/telehealth.  Future Appointments  Date Time Provider Department Center  07/30/2018 11:00 AM [facility]-MFC NURSE [facility]-MFC M[facility]-US  07/30/2018 11:00 AM [facility]-MFC [place] 3 WH-MFCUS MFC-US    Raelyn Mora, CNM [facility] [employer], [facility]

## 2018-07-01 NOTE — Patient Instructions (Signed)

## 2018-07-30 ENCOUNTER — Ambulatory Visit (HOSPITAL_COMMUNITY)
Admission: RE | Admit: 2018-07-30 | Discharge: 2018-07-30 | Disposition: A | Discharge status: Home / Self Care | Payer: Medicaid Other | Source: Ambulatory Visit | Attending: Obstetrics and Gynecology | Admitting: Obstetrics and Gynecology

## 2018-07-30 ENCOUNTER — Other Ambulatory Visit: Payer: Self-pay

## 2018-07-30 ENCOUNTER — Ambulatory Visit (HOSPITAL_COMMUNITY): Payer: Medicaid Other | Admitting: *Deleted

## 2018-07-30 ENCOUNTER — Encounter (HOSPITAL_COMMUNITY): Payer: Self-pay

## 2018-07-30 VITALS — BP 108/48 | HR 96 | Temp 98.6°F

## 2018-07-30 DIAGNOSIS — O99212 Obesity complicating pregnancy, second trimester: Secondary | ICD-10-CM

## 2018-07-30 DIAGNOSIS — Z3689 Encounter for other specified antenatal screening: Secondary | ICD-10-CM | POA: Diagnosis not present

## 2018-07-30 DIAGNOSIS — Z3A23 23 weeks gestation of pregnancy: Secondary | ICD-10-CM | POA: Diagnosis not present

## 2018-07-30 DIAGNOSIS — Z362 Encounter for other antenatal screening follow-up: Secondary | ICD-10-CM

## 2018-08-02 ENCOUNTER — Other Ambulatory Visit (HOSPITAL_COMMUNITY): Payer: Self-pay | Admitting: *Deleted

## 2018-08-02 DIAGNOSIS — O99212 Obesity complicating pregnancy, second trimester: Secondary | ICD-10-CM

## 2018-08-04 ENCOUNTER — Telehealth: Payer: 59 | Admitting: Obstetrics and Gynecology

## 2018-08-25 ENCOUNTER — Ambulatory Visit (INDEPENDENT_AMBULATORY_CARE_PROVIDER_SITE_OTHER): Payer: Medicaid Other | Admitting: Obstetrics and Gynecology

## 2018-08-25 ENCOUNTER — Encounter: Payer: Self-pay | Admitting: General Practice

## 2018-08-25 ENCOUNTER — Encounter: Payer: Self-pay | Admitting: Obstetrics and Gynecology

## 2018-08-25 ENCOUNTER — Other Ambulatory Visit: Payer: Self-pay

## 2018-08-25 VITALS — BP 116/78 | HR 92 | Temp 98.2°F | Wt 299.6 lb

## 2018-08-25 DIAGNOSIS — Z3A27 27 weeks gestation of pregnancy: Secondary | ICD-10-CM

## 2018-08-25 DIAGNOSIS — G43009 Migraine without aura, not intractable, without status migrainosus: Secondary | ICD-10-CM

## 2018-08-25 DIAGNOSIS — Z23 Encounter for immunization: Secondary | ICD-10-CM | POA: Diagnosis not present

## 2018-08-25 DIAGNOSIS — O26843 Uterine size-date discrepancy, third trimester: Secondary | ICD-10-CM | POA: Diagnosis not present

## 2018-08-25 DIAGNOSIS — O26892 Other specified pregnancy related conditions, second trimester: Secondary | ICD-10-CM

## 2018-08-25 DIAGNOSIS — Z3400 Encounter for supervision of normal first pregnancy, unspecified trimester: Secondary | ICD-10-CM

## 2018-08-25 MED ORDER — CYCLOBENZAPRINE HCL 10 MG PO TABS: 10.0000 mg | ORAL_TABLET | Freq: Two times a day (BID) | ORAL | 0 refills | Status: DC | PRN

## 2018-08-25 NOTE — Progress Notes (Signed)
   PRENATAL VISIT NOTE  Subjective:  Krystal Hudson is a 23 y.o. G1P0 at [redacted]w[redacted]d being seen today for ongoing prenatal care.  She is currently monitored for the following issues for this low-risk pregnancy and has Supervision of normal first pregnancy, antepartum on their problem list.  Patient reports lower abdominal pain with movement and "really bad migraine headaches." She reports a h/o migraines before pregnancy.  Contractions: Not present. Vag. Bleeding: None.  Movement: Absent. Denies leaking of fluid.   The following portions of the patient's history were reviewed and updated as appropriate: allergies, current medications, past family history, past medical history, past social history, past surgical history and problem list.   Objective:   Vitals:   08/25/18 0849  BP: 116/78  Pulse: 92  Temp: 98.2 F (36.8 C)  Weight: 299 lb 9.6 oz (135.9 kg)    Fetal Status: Fetal Heart Rate (bpm): 137 Fundal Height: 31 cm Movement: Absent     General:  Alert, oriented and cooperative. Patient is in no acute distress.  Skin: Skin is warm and dry. No rash noted.   Cardiovascular: Normal heart rate noted  Respiratory: Normal respiratory effort, no problems with respiration noted  Abdomen: Soft, gravid, appropriate for gestational age.  Pain/Pressure: Absent     Pelvic: Cervical exam deferred        Extremities: Normal range of motion.  Edema: None  Mental Status: Normal mood and affect. Normal behavior. Normal judgment and thought content.   Assessment and Plan:  Pregnancy: G1P0 at [redacted]w[redacted]d 1. Supervision of normal first pregnancy, antepartum - Glucose Tolerance, 2 Hours w/1 Hour - HIV Antibody (routine testing w rflx) - RPR - Tdap vaccine greater than or equal to 7yo IM - CBC  2. Uterine size date discrepancy pregnancy, third trimester  - Scheduled for growth U/S on 7/10  3. Migraine without aura and without status migrainosus, not intractable  - Rx for Flexeril 10 mg BID prn H/A -  Advised that can Rx Fioricet, if Flexeril does not relieve migraine h/a    Preterm labor symptoms and general obstetric precautions including but not limited to vaginal bleeding, contractions, leaking of fluid and fetal movement were reviewed in detail with the patient. Please refer to After Visit Summary for other counseling recommendations.   Return in about 5 weeks (around 09/29/2018) for Return OB - My Chart video.  Future Appointments  Date Time Provider [place]  08/27/2018 11:00 AM [place] [facility] [place] [facility]  08/27/2018 11:00 AM [place] 3 WH-MFCUS MFC-US  09/30/2018  2:20 PM Laury Deep, CNM [facility] None  10/28/2018  1:10 PM Laury Deep, CNM [facility] None  11/11/2018 10:10 AM Laury Deep, CNM [facility] None    Laury Deep, CNM

## 2018-08-26 ENCOUNTER — Encounter: Payer: Self-pay | Admitting: General Practice

## 2018-08-26 LAB — CBC
Hematocrit: 31.1 % — ABNORMAL LOW (ref 34.0–46.6)
Hemoglobin: 10.1 g/dL — ABNORMAL LOW (ref 11.1–15.9)
MCH: 26.8 pg (ref 26.6–33.0)
MCHC: 32.5 g/dL (ref 31.5–35.7)
MCV: 83 fL (ref 79–97)
Platelets: 291 10*3/uL (ref 150–450)
RBC: 3.77 x10E6/uL (ref 3.77–5.28)
RDW: 13.1 % (ref 11.7–15.4)
WBC: 7.6 10*3/uL (ref 3.4–10.8)

## 2018-08-26 LAB — HIV ANTIBODY (ROUTINE TESTING W REFLEX): HIV Screen 4th Generation wRfx: NONREACTIVE

## 2018-08-26 LAB — RPR: RPR Ser Ql: NONREACTIVE

## 2018-08-26 LAB — GLUCOSE TOLERANCE, 2 HOURS W/ 1HR
Glucose, 1 hour: 128 mg/dL (ref 65–179)
Glucose, 2 hour: 88 mg/dL (ref 65–152)
Glucose, Fasting: 74 mg/dL (ref 65–91)

## 2018-08-27 ENCOUNTER — Ambulatory Visit (HOSPITAL_COMMUNITY)
Admission: RE | Admit: 2018-08-27 | Discharge: 2018-08-27 | Disposition: A | Discharge status: Home / Self Care | Payer: Medicaid Other | Source: Ambulatory Visit | Attending: Obstetrics and Gynecology | Admitting: Obstetrics and Gynecology

## 2018-08-27 ENCOUNTER — Encounter (HOSPITAL_COMMUNITY): Payer: Self-pay

## 2018-08-27 ENCOUNTER — Other Ambulatory Visit: Payer: Self-pay

## 2018-08-27 ENCOUNTER — Ambulatory Visit (HOSPITAL_COMMUNITY): Payer: Medicaid Other | Admitting: *Deleted

## 2018-08-27 ENCOUNTER — Other Ambulatory Visit (HOSPITAL_COMMUNITY): Payer: Self-pay | Admitting: *Deleted

## 2018-08-27 VITALS — BP 116/65 | HR 94 | Temp 98.4°F

## 2018-08-27 DIAGNOSIS — O99212 Obesity complicating pregnancy, second trimester: Secondary | ICD-10-CM

## 2018-08-27 DIAGNOSIS — Z362 Encounter for other antenatal screening follow-up: Secondary | ICD-10-CM

## 2018-08-27 DIAGNOSIS — Z3A27 27 weeks gestation of pregnancy: Secondary | ICD-10-CM | POA: Diagnosis not present

## 2018-08-27 DIAGNOSIS — O0990 Supervision of high risk pregnancy, unspecified, unspecified trimester: Secondary | ICD-10-CM | POA: Diagnosis present

## 2018-09-24 ENCOUNTER — Other Ambulatory Visit (HOSPITAL_COMMUNITY): Payer: Self-pay | Admitting: *Deleted

## 2018-09-24 ENCOUNTER — Ambulatory Visit (HOSPITAL_COMMUNITY)
Admission: RE | Admit: 2018-09-24 | Discharge: 2018-09-24 | Disposition: A | Discharge status: Home / Self Care | Payer: Medicaid Other | Source: Ambulatory Visit | Attending: Obstetrics and Gynecology | Admitting: Obstetrics and Gynecology

## 2018-09-24 ENCOUNTER — Other Ambulatory Visit: Payer: Self-pay

## 2018-09-24 DIAGNOSIS — Z3A31 31 weeks gestation of pregnancy: Secondary | ICD-10-CM | POA: Diagnosis not present

## 2018-09-24 DIAGNOSIS — Z148 Genetic carrier of other disease: Secondary | ICD-10-CM | POA: Diagnosis not present

## 2018-09-24 DIAGNOSIS — O99210 Obesity complicating pregnancy, unspecified trimester: Secondary | ICD-10-CM

## 2018-09-24 DIAGNOSIS — Z362 Encounter for other antenatal screening follow-up: Secondary | ICD-10-CM | POA: Diagnosis present

## 2018-09-24 DIAGNOSIS — O99212 Obesity complicating pregnancy, second trimester: Secondary | ICD-10-CM

## 2018-09-25 ENCOUNTER — Other Ambulatory Visit: Payer: Self-pay

## 2018-09-25 ENCOUNTER — Encounter (HOSPITAL_COMMUNITY): Payer: Self-pay | Admitting: *Deleted

## 2018-09-25 ENCOUNTER — Inpatient Hospital Stay (HOSPITAL_COMMUNITY)
Admission: AD | Admit: 2018-09-25 | Discharge: 2018-09-25 | Disposition: A | Discharge status: Home / Self Care | Payer: Medicaid Other | Attending: Obstetrics & Gynecology | Admitting: Obstetrics & Gynecology

## 2018-09-25 DIAGNOSIS — Z3A31 31 weeks gestation of pregnancy: Secondary | ICD-10-CM | POA: Insufficient documentation

## 2018-09-25 DIAGNOSIS — R109 Unspecified abdominal pain: Secondary | ICD-10-CM

## 2018-09-25 DIAGNOSIS — O26893 Other specified pregnancy related conditions, third trimester: Secondary | ICD-10-CM

## 2018-09-25 DIAGNOSIS — Z3400 Encounter for supervision of normal first pregnancy, unspecified trimester: Secondary | ICD-10-CM

## 2018-09-25 DIAGNOSIS — B9689 Other specified bacterial agents as the cause of diseases classified elsewhere: Secondary | ICD-10-CM | POA: Diagnosis not present

## 2018-09-25 DIAGNOSIS — O26853 Spotting complicating pregnancy, third trimester: Secondary | ICD-10-CM | POA: Insufficient documentation

## 2018-09-25 DIAGNOSIS — O23593 Infection of other part of genital tract in pregnancy, third trimester: Secondary | ICD-10-CM | POA: Diagnosis not present

## 2018-09-25 DIAGNOSIS — M549 Dorsalgia, unspecified: Secondary | ICD-10-CM | POA: Diagnosis present

## 2018-09-25 LAB — URINALYSIS, ROUTINE W REFLEX MICROSCOPIC
Bilirubin Urine: NEGATIVE
Glucose, UA: NEGATIVE mg/dL
Hgb urine dipstick: NEGATIVE
Ketones, ur: NEGATIVE mg/dL
Leukocytes,Ua: NEGATIVE
Nitrite: NEGATIVE
Protein, ur: NEGATIVE mg/dL
Specific Gravity, Urine: 1.014 (ref 1.005–1.030)
pH: 6 (ref 5.0–8.0)

## 2018-09-25 LAB — WET PREP, GENITAL
Sperm: NONE SEEN
Trich, Wet Prep: NONE SEEN
Yeast Wet Prep HPF POC: NONE SEEN

## 2018-09-25 MED ORDER — METRONIDAZOLE 500 MG PO TABS: 500.0000 mg | ORAL_TABLET | Freq: Two times a day (BID) | ORAL | 0 refills | Status: AC

## 2018-09-25 NOTE — Progress Notes (Signed)
EFM d/ced for d/c home

## 2018-09-25 NOTE — MAU Provider Note (Signed)
Chief Complaint:  Back Pain and Vaginal Bleeding   First Provider Initiated Contact with Patient 09/25/18 0219      HPI: Krystal Hudson is a [age] y.o. G1P0 at 365w6d who presents to maternity admissions reporting back pain, abdominal tightening, and vaginal spotting x 2 weeks. She reports the symptoms have been intermittent, occurring 3-4 days/week but not daily.  The pain is in her back, with tightening but no pain in her abdomen. She reports pain and bleeding after her ultrasound today, but these are not worse that what she has been having x 2 week. She feels normal fetal movement. There are no other symptoms. She has not tried any treatments.   HPI  Past Medical History: Past Medical History:  Diagnosis Date  . Medical history non-contributory     Past obstetric history: OB History  Gravida Para Term Preterm AB Living  1            SAB TAB Ectopic Multiple Live Births               # Outcome Date GA Lbr Len/2nd Weight Sex Delivery Anes PTL Lv  1 Current             Past Surgical History: Past Surgical History:  Procedure Laterality Date  . NO PAST SURGERIES      Family History: History reviewed. No pertinent family history.  Social History: Social History   Tobacco Use  . Smoking status: Never Smoker  . Smokeless tobacco: Never Used  Substance Use Topics  . Alcohol use: Never    Frequency: Never  . Drug use: Never    Allergies: No Known Allergies  Meds:  No medications prior to admission.    ROS:  Review of Systems  Constitutional: Negative for chills, fatigue and fever.  Eyes: Negative for visual disturbance.  Respiratory: Negative for shortness of breath.   Cardiovascular: Negative for chest pain.  Gastrointestinal: Negative for abdominal pain, nausea and vomiting.  Genitourinary: Positive for vaginal bleeding. Negative for difficulty urinating, dysuria, flank pain, pelvic pain, vaginal discharge and vaginal pain.  Musculoskeletal: Positive for back  pain.  Neurological: Negative for dizziness and headaches.  Psychiatric/Behavioral: Negative.      I have reviewed patient's Past Medical Hx, Surgical Hx, Family Hx, Social Hx, medications and allergies.   Physical Exam   Patient Vitals for the past 24 hrs:  BP Temp Pulse Resp Height Weight  09/25/18 0503 105/64 - (!) 103 16 - -  09/25/18 0048 116/64 - (!) 110 - - -  09/25/18 0047 - 98.6 F (37 C) - 18 5\' 4"  (1.626 m) (!) 137.9 kg   Constitutional: Well-developed, well-nourished female in no acute distress.  Cardiovascular: normal rate Respiratory: normal effort GI: Abd soft, non-tender, gravid appropriate for gestational age.  MS: Extremities nontender, no edema, normal ROM Neurologic: Alert and oriented x 4.  GU: Neg CVAT.  PELVIC EXAM: Cervix pink, visually closed, without lesion, moderate amount thin white discharge, vaginal walls and external genitalia normal   Dilation: Closed Effacement (%): Thick Cervical Position: Posterior Exam by:: Arbie CookeyLisa Leftwhich Kirby CNM   FHT:  Baseline 135 , moderate variability, accelerations present, no decelerations Contractions: None on toco or to palpation   Labs: Results for orders placed or performed during the hospital encounter of 09/25/18 (from the past 24 hour(s))  Urinalysis, Routine w reflex microscopic     Status: Abnormal   Collection Time: 09/25/18  1:19 AM  Result Value Ref Range  Color, Urine YELLOW YELLOW   APPearance HAZY (A) CLEAR   Specific Gravity, Urine 1.014 1.005 - 1.030   pH 6.0 5.0 - 8.0   Glucose, UA NEGATIVE NEGATIVE mg/dL   Hgb urine dipstick NEGATIVE NEGATIVE   Bilirubin Urine NEGATIVE NEGATIVE   Ketones, ur NEGATIVE NEGATIVE mg/dL   Protein, ur NEGATIVE NEGATIVE mg/dL   Nitrite NEGATIVE NEGATIVE   Leukocytes,Ua NEGATIVE NEGATIVE  Wet prep, genital     Status: Abnormal   Collection Time: 09/25/18  2:26 AM   Specimen: Genital  Result Value Ref Range   Yeast Wet Prep HPF POC NONE SEEN NONE SEEN    Trich, Wet Prep NONE SEEN NONE SEEN   Clue Cells Wet Prep HPF POC PRESENT (A) NONE SEEN   WBC, Wet Prep HPF POC MODERATE (A) NONE SEEN   Sperm NONE SEEN    O/Positive/-- (03/16 1458)  Imaging:    MAU Course/MDM: Orders Placed This Encounter  Procedures  . Wet prep, genital  . Urinalysis, Routine w reflex microscopic  . Discharge patient    Meds ordered this encounter  Medications  . metroNIDAZOLE (FLAGYL) 500 MG tablet    Sig: Take 1 tablet (500 mg total) by mouth 2 (two) times daily for 7 days.    Dispense:  14 tablet    Refill:  0    Order Specific Question:   Supervising Provider    Answer:   Clovia Cuff C [[id number]]     NST reviewed and reactive No bleeding noted on today's exam Cervix closed/thick/high, so no evidence of preterm labor Wet prep with clue cells, will treat for BV with pt symptoms Flagyl 500 mg BID x 7 days F/U in office Return to MAU with signs of PTL  Pt discharge with strict return precautions.    Assessment: 1. Abdominal pain during pregnancy in third trimester   2. Supervision of normal first pregnancy, antepartum   3. Spotting affecting pregnancy in third trimester   4. Bacterial vaginosis     Plan: Discharge home Labor precautions and fetal kick counts Follow-up Information    [facility] Follow up.   Specialty: Obstetrics and Gynecology Why: As scheduled, return to MAU as needed for emergencies. Contact information: [place] [address] [phone]         Allergies as of 09/25/2018   No Known Allergies     Medication List    TAKE these medications   cyclobenzaprine 10 MG tablet Commonly known as: FLEXERIL Take 1 tablet (10 mg total) by mouth 2 (two) times daily as needed for muscle spasms (Migraine Headaches).   metroNIDAZOLE 500 MG tablet Commonly known as: FLAGYL Take 1 tablet (500 mg total) by mouth 2 (two) times daily for 7 days.   multivitamin-prenatal  27-0.8 MG Tabs tablet Take 1 tablet by mouth daily at 12 noon.       [name] Certified Nurse-Midwife 09/25/2018 5:50 AM

## 2018-09-25 NOTE — Progress Notes (Signed)
Written and verbal d/c instructions given and understanding voiced. 

## 2018-09-25 NOTE — MAU Note (Signed)
Have been spotting for few wks. Had u/s today and 3-4 hrs later started having more bright red spotting and some back pain. Stomach gets tight. Denies LOF. Good FM.

## 2018-09-28 LAB — GC/CHLAMYDIA PROBE AMP (~~LOC~~) NOT AT ARMC
Chlamydia: NEGATIVE
Neisseria Gonorrhea: NEGATIVE

## 2018-09-30 ENCOUNTER — Telehealth: Payer: 59 | Admitting: Obstetrics and Gynecology

## 2018-10-05 ENCOUNTER — Other Ambulatory Visit: Payer: Self-pay

## 2018-10-05 ENCOUNTER — Encounter (HOSPITAL_COMMUNITY): Payer: Self-pay | Admitting: *Deleted

## 2018-10-05 ENCOUNTER — Inpatient Hospital Stay (HOSPITAL_COMMUNITY)
Admission: AD | Admit: 2018-10-05 | Discharge: 2018-10-05 | Disposition: A | Discharge status: Home / Self Care | Payer: Medicaid Other | Attending: Obstetrics and Gynecology | Admitting: Obstetrics and Gynecology

## 2018-10-05 DIAGNOSIS — N898 Other specified noninflammatory disorders of vagina: Secondary | ICD-10-CM | POA: Diagnosis not present

## 2018-10-05 DIAGNOSIS — M549 Dorsalgia, unspecified: Secondary | ICD-10-CM | POA: Diagnosis not present

## 2018-10-05 DIAGNOSIS — O26893 Other specified pregnancy related conditions, third trimester: Secondary | ICD-10-CM | POA: Insufficient documentation

## 2018-10-05 DIAGNOSIS — Z3A33 33 weeks gestation of pregnancy: Secondary | ICD-10-CM | POA: Diagnosis not present

## 2018-10-05 DIAGNOSIS — O368130 Decreased fetal movements, third trimester, not applicable or unspecified: Secondary | ICD-10-CM | POA: Diagnosis not present

## 2018-10-05 DIAGNOSIS — O368131 Decreased fetal movements, third trimester, fetus 1: Secondary | ICD-10-CM

## 2018-10-05 LAB — URINALYSIS, ROUTINE W REFLEX MICROSCOPIC
Bilirubin Urine: NEGATIVE
Glucose, UA: NEGATIVE mg/dL
Hgb urine dipstick: NEGATIVE
Ketones, ur: NEGATIVE mg/dL
Leukocytes,Ua: NEGATIVE
Nitrite: NEGATIVE
Protein, ur: NEGATIVE mg/dL
Specific Gravity, Urine: 1.025 (ref 1.005–1.030)
pH: 6 (ref 5.0–8.0)

## 2018-10-05 LAB — WET PREP, GENITAL
Clue Cells Wet Prep HPF POC: NONE SEEN
Sperm: NONE SEEN
Trich, Wet Prep: NONE SEEN
Yeast Wet Prep HPF POC: NONE SEEN

## 2018-10-05 NOTE — MAU Note (Signed)
.   Krystal Hudson is a 23 y.o. at [redacted]w[redacted]d here in MAU reporting: a decrease in fetal movement with a thin green vaginal discharge since yesterday. Lower back pain 5/10  Onset of complaint: yesterday Pain score: 5 Vitals:   10/05/18 1357  BP: (!) 146/67  Pulse: 97  Resp: 16  Temp: 98.3 F (36.8 C)  SpO2: 100%     FHT:145 Lab orders placed from triage:

## 2018-10-05 NOTE — MAU Provider Note (Signed)
Chief Complaint:  Vaginal Discharge and Decreased Fetal Movement   First Provider Initiated Contact with Patient 10/05/18 1403      HPI: Krystal NeriDenise Pettis is a 23 y.o. G1P0 at 9060w2d by LMP who presents to maternity admissions reporting vaginal discharge and decreased fetal movement. Patient reports she was here on 8/8 for vaginal discharge and pelvic pain and diagnosed with BV and given Flagyl. Both symptoms resolved. Yesterday she noted some green discharge and then some white/yellow discharge today. Also reports decreased FM estimating that baby is moving about 5x in 2 hours. She continues to feel fetal movement, but less than normal. Reports continued back pain she has had throughout pregnancy but denies any other pain. She denies LOF, vaginal bleeding, vaginal itching/burning, urinary symptoms, h/a, dizziness, vision changes, n/v, diarrhea, constipation or fever/chills.    Past Medical History: Past Medical History:  Diagnosis Date  . Medical history non-contributory     Past obstetric history: OB History  Gravida Para Term Preterm AB Living  1            SAB TAB Ectopic Multiple Live Births               # Outcome Date GA Lbr Len/2nd Weight Sex Delivery Anes PTL Lv  1 Current             Past Surgical History: Past Surgical History:  Procedure Laterality Date  . NO PAST SURGERIES      Family History: No family history on file.  Social History: Social History   Tobacco Use  . Smoking status: Never Smoker  . Smokeless tobacco: Never Used  Substance Use Topics  . Alcohol use: Never    Frequency: Never  . Drug use: Never    Allergies: No Known Allergies  Meds:  Medications Prior to Admission  Medication Sig Dispense Refill Last Dose  . Prenatal Vit-Fe Fumarate-FA (MULTIVITAMIN-PRENATAL) 27-0.8 MG TABS tablet Take 1 tablet by mouth daily at 12 noon.   10/05/2018 at Unknown time  . cyclobenzaprine (FLEXERIL) 10 MG tablet Take 1 tablet (10 mg total) by mouth 2 (two)  times daily as needed for muscle spasms (Migraine Headaches). (Patient not taking: Reported on 08/27/2018) 60 tablet 0     ROS:  Review of Systems All other systems negative unless noted above in HPI.   I have reviewed patient's Past Medical Hx, Surgical Hx, Family Hx, Social Hx, medications and allergies.   Physical Exam   Patient Vitals for the past 24 hrs:  BP Temp Temp src Pulse Resp SpO2  10/05/18 1515 (!) 117/59 98.1 F (36.7 C) Oral (!) 106 19 99 %  10/05/18 1423 134/72 - - 98 20 98 %  10/05/18 1357 (!) 146/67 98.3 F (36.8 C) - 97 16 100 %   Constitutional: Well-developed, well-nourished female in no acute distress.  Cardiovascular: normal rate Respiratory: normal effort GI: Abd soft, non-tender, gravid appropriate for gestational age.  MS: Extremities nontender, no edema, normal ROM Neurologic: Alert and oriented x 4.  GU: Neg CVAT. External vaginal normal without discharge noted.    FHT:  Baseline 140 , moderate variability, accelerations present, no decelerations Contractions: None   Labs: Results for orders placed or performed during the hospital encounter of 10/05/18 (from the past 24 hour(s))  Wet prep, genital     Status: Abnormal   Collection Time: 10/05/18  2:30 PM   Specimen: Genital  Result Value Ref Range   Yeast Wet Prep HPF POC NONE SEEN NONE  SEEN   Trich, Wet Prep NONE SEEN NONE SEEN   Clue Cells Wet Prep HPF POC NONE SEEN NONE SEEN   WBC, Wet Prep HPF POC FEW (A) NONE SEEN   Sperm NONE SEEN    O/Positive/-- (03/16 1458)  Imaging:  [place] Mfm Ob Follow Up  Result Date: 09/25/2018 ----------------------------------------------------------------------  OBSTETRICS REPORT                        (Signed Final 09/25/2018 07:43 am) ---------------------------------------------------------------------- Patient Info  ID #:       [id number]                          D.O.B.:  1995/06/07 (23 yrs)  Name:       Krystal Hudson                  Visit Date: 09/24/2018  10:57 am ---------------------------------------------------------------------- Performed By  Performed By:     Eden Lathe BS      Ref. Address:      [address]                    [facility]T                                                              [address]                                                              [place], [place]                                                              [address]  Attending:        Lin Landsman      [facility]  Referred By:      Raelyn Mora CNM ---------------------------------------------------------------------- Orders   #  Description                          Code         Ordered By   1  [place] MFM OB FOLLOW UP                  01027.25     Bettey Costa  BOOKER  ----------------------------------------------------------------------   #  Order #                    Accession #                 Episode #   1  [id number]                  [id number]                  [id number]  ---------------------------------------------------------------------- Indications   Encounter for other antenatal screening        Z36.2   follow-up (no testing in chart)   Obesity complicating pregnancy, second         O99.212   trimester (BMI 43)   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 Cord Insertion:       Visualized  Amniotic Fluid  AFI FV:      Within normal  limits  AFI Sum(cm)     %Tile       Largest Pocket(cm)  21.9            85          7.29  RUQ(cm)       RLQ(cm)       LUQ(cm)        LLQ(cm)  7.29          4.64          4.62           5.35 ---------------------------------------------------------------------- Biometry  BPD:      83.8  mm     G. Age:  33w 5d         91  %    CI:        72.88   %    70 - 86                                                          FL/HC:       19.4  %    19.1 - 21.3  HC:      312.1  mm     G. Age:  35w 0d         91  %    HC/AC:       1.04  0.96 - 1.17  AC:      299.8  mm     G. Age:  34w 0d         95  %    FL/BPD:      72.4  %    71 - 87  FL:       60.7  mm     G. Age:  31w 4d         32  %    FL/AC:       20.2  %    20 - 24  HUM:      55.6  mm     G. Age:  32w 3d         63  %  Est. FW:    2175   gm   4 lb 13 oz      88  % ---------------------------------------------------------------------- OB History  Gravidity:    1 ---------------------------------------------------------------------- Gestational Age  LMP:           31w 5d        Date:  02/14/18                 EDD:   11/21/18  U/S Today:     33w 4d                                        EDD:   11/08/18  Best:          31w 5d     Det. By:  LMP  (02/14/18)          EDD:   11/21/18 ---------------------------------------------------------------------- Anatomy  Cranium:               Appears normal         Aortic Arch:            Appears normal  Cavum:                 Appears normal         Ductal Arch:            Not well visualized  Ventricles:            Appears normal         Diaphragm:              Previously seen  Choroid Plexus:        Appears normal         Stomach:                Appears normal, left                                                                        sided  Cerebellum:            Appears normal         Abdomen:                Previously seen  Posterior Fossa:       Previously seen        Abdominal Wall:  Previously seen  Nuchal Fold:            Previously seen        Cord Vessels:           Appears normal (3                                                                        vessel cord)  Face:                  Appears normal         Kidneys:                Appear normal                         (orbits and profile)  Lips:                  Appears normal         Bladder:                Appears normal  Thoracic:              Appears normal         Spine:                  Previously seen  Heart:                 Previously seen        Upper Extremities:      Previously seen  RVOT:                  Previously seen        Lower Extremities:      Previously seen  LVOT:                  Appears normal  Other:  Feet visualized. Technically difficult due to maternal habitus and fetal          position. ---------------------------------------------------------------------- Cervix Uterus Adnexa  Cervix  Not visualized (advanced GA >24wks)  Uterus  No abnormality visualized.  Left Ovary  Not visualized.  Right Ovary  Not visualized.  Cul De Sac  No free fluid seen.  Adnexa  No abnormality visualized. ---------------------------------------------------------------------- Impression  Normal interval growth.  Elevated BMI ---------------------------------------------------------------------- Recommendations  Follow up growth in 4 weeks ----------------------------------------------------------------------               Lin Landsmanorenthian Booker, MD Electronically Signed Final Report   09/25/2018 07:43 am ----------------------------------------------------------------------   MAU Course/MDM: Orders Placed This Encounter  Procedures  . Wet prep, genital  . Urinalysis, Routine w reflex microscopic  . Discharge patient Discharge disposition: 01-Home or Self Care; Discharge patient date: 10/05/2018    No orders of the defined types were placed in this encounter.   BP Readings from Last 4 Encounters:  10/05/18 (!) 117/59  09/25/18 105/64  08/27/18 116/65   08/25/18 116/78   MDM: NST reviewed: Cat I Wet mount and G/C collected. Wet mount WNL. Patient given apple juice and instructed to count fetal movements. She then counted 12 movements in less than one hour. Patient was given instructions on kick  counts at home. One elevated SBP on presentation without other symptoms and repeat normal as well as from previous visits in clinic/MAU. Advised to monitor at home.  Pt discharge with preterm labor precautions. Patient verbalized understanding of discharge and follow-up instructions and was ambulating without assistance upon discharge.   Assessment: 1. Vaginal discharge in pregnancy in third trimester   2. Decreased fetal movements in third trimester, fetus 1 of multiple gestation     Plan: Discharge home Labor precautions and fetal kick counts Follow-up Information    [facility] Follow up.   Specialty: Obstetrics and Gynecology Contact information: [address] [place] [address] [phone]         Allergies as of 10/05/2018   No Known Allergies     Medication List    TAKE these medications   cyclobenzaprine 10 MG tablet Commonly known as: FLEXERIL Take 1 tablet (10 mg total) by mouth 2 (two) times daily as needed for muscle spasms (Migraine Headaches).   multivitamin-prenatal 27-0.8 MG Tabs tablet Take 1 tablet by mouth daily at 12 noon.      Jerilynn Birkenhead, MD [facility] Family Medicine Fellow, [facility], [facility] 10/05/2018 3:18 PM

## 2018-10-05 NOTE — Discharge Instructions (Signed)
Please measure blood pressure once daily and inform your prenatal provider if top number is > 140 or bottom number > 90.

## 2018-10-06 ENCOUNTER — Encounter: Payer: Medicaid Other | Admitting: Certified Nurse Midwife

## 2018-10-06 LAB — GC/CHLAMYDIA PROBE AMP (~~LOC~~) NOT AT ARMC
Chlamydia: NEGATIVE
Neisseria Gonorrhea: NEGATIVE

## 2018-10-21 ENCOUNTER — Ambulatory Visit (HOSPITAL_COMMUNITY)
Admission: RE | Admit: 2018-10-21 | Discharge: 2018-10-21 | Disposition: A | Discharge status: Home / Self Care | Payer: Medicaid Other | Source: Ambulatory Visit | Attending: Obstetrics and Gynecology | Admitting: Obstetrics and Gynecology

## 2018-10-21 ENCOUNTER — Other Ambulatory Visit: Payer: Self-pay

## 2018-10-21 DIAGNOSIS — O99213 Obesity complicating pregnancy, third trimester: Secondary | ICD-10-CM | POA: Diagnosis not present

## 2018-10-21 DIAGNOSIS — Z148 Genetic carrier of other disease: Secondary | ICD-10-CM | POA: Diagnosis not present

## 2018-10-21 DIAGNOSIS — Z3A35 35 weeks gestation of pregnancy: Secondary | ICD-10-CM | POA: Diagnosis not present

## 2018-10-21 DIAGNOSIS — Z362 Encounter for other antenatal screening follow-up: Secondary | ICD-10-CM | POA: Diagnosis not present

## 2018-10-21 DIAGNOSIS — O99210 Obesity complicating pregnancy, unspecified trimester: Secondary | ICD-10-CM | POA: Diagnosis not present

## 2018-10-28 ENCOUNTER — Ambulatory Visit (INDEPENDENT_AMBULATORY_CARE_PROVIDER_SITE_OTHER): Payer: Medicaid Other | Admitting: Obstetrics and Gynecology

## 2018-10-28 ENCOUNTER — Inpatient Hospital Stay (HOSPITAL_COMMUNITY)
Admission: AD | Admit: 2018-10-28 | Discharge: 2018-10-28 | Disposition: A | Discharge status: Home / Self Care | Payer: Medicaid Other | Attending: Obstetrics and Gynecology | Admitting: Obstetrics and Gynecology

## 2018-10-28 ENCOUNTER — Other Ambulatory Visit (HOSPITAL_COMMUNITY)
Admission: RE | Admit: 2018-10-28 | Discharge: 2018-10-28 | Disposition: A | Discharge status: Home / Self Care | Payer: Medicaid Other | Source: Ambulatory Visit | Attending: Obstetrics and Gynecology | Admitting: Obstetrics and Gynecology

## 2018-10-28 ENCOUNTER — Encounter (HOSPITAL_COMMUNITY): Payer: Self-pay

## 2018-10-28 ENCOUNTER — Encounter: Payer: Self-pay | Admitting: Obstetrics and Gynecology

## 2018-10-28 ENCOUNTER — Other Ambulatory Visit: Payer: Self-pay

## 2018-10-28 VITALS — BP 133/85 | HR 98 | Temp 97.7°F | Wt 307.6 lb

## 2018-10-28 DIAGNOSIS — H538 Other visual disturbances: Secondary | ICD-10-CM | POA: Diagnosis not present

## 2018-10-28 DIAGNOSIS — O163 Unspecified maternal hypertension, third trimester: Secondary | ICD-10-CM

## 2018-10-28 DIAGNOSIS — O26893 Other specified pregnancy related conditions, third trimester: Secondary | ICD-10-CM

## 2018-10-28 DIAGNOSIS — Z3689 Encounter for other specified antenatal screening: Secondary | ICD-10-CM

## 2018-10-28 DIAGNOSIS — Z3A36 36 weeks gestation of pregnancy: Secondary | ICD-10-CM | POA: Insufficient documentation

## 2018-10-28 DIAGNOSIS — O99019 Anemia complicating pregnancy, unspecified trimester: Secondary | ICD-10-CM

## 2018-10-28 DIAGNOSIS — R51 Headache: Secondary | ICD-10-CM | POA: Insufficient documentation

## 2018-10-28 DIAGNOSIS — Z3400 Encounter for supervision of normal first pregnancy, unspecified trimester: Secondary | ICD-10-CM | POA: Insufficient documentation

## 2018-10-28 DIAGNOSIS — O99013 Anemia complicating pregnancy, third trimester: Secondary | ICD-10-CM | POA: Diagnosis not present

## 2018-10-28 DIAGNOSIS — D649 Anemia, unspecified: Secondary | ICD-10-CM | POA: Diagnosis not present

## 2018-10-28 DIAGNOSIS — O139 Gestational [pregnancy-induced] hypertension without significant proteinuria, unspecified trimester: Secondary | ICD-10-CM | POA: Insufficient documentation

## 2018-10-28 DIAGNOSIS — H539 Unspecified visual disturbance: Secondary | ICD-10-CM

## 2018-10-28 LAB — PROTEIN / CREATININE RATIO, URINE
Creatinine, Urine: 112.36 mg/dL
Protein Creatinine Ratio: 0.07 mg/mg{Cre} (ref 0.00–0.15)
Total Protein, Urine: 8 mg/dL

## 2018-10-28 LAB — COMPREHENSIVE METABOLIC PANEL
ALT: 13 U/L (ref 0–44)
AST: 18 U/L (ref 15–41)
Albumin: 2.4 g/dL — ABNORMAL LOW (ref 3.5–5.0)
Alkaline Phosphatase: 110 U/L (ref 38–126)
Anion gap: 10 (ref 5–15)
BUN: <5 mg/dL — ABNORMAL LOW (ref 6–20)
CO2: 19 mmol/L — ABNORMAL LOW (ref 22–32)
Calcium: 8.4 mg/dL — ABNORMAL LOW (ref 8.9–10.3)
Chloride: 107 mmol/L (ref 98–111)
Creatinine, Ser: 0.46 mg/dL (ref 0.44–1.00)
GFR calc Af Amer: >60 mL/min (ref >60)
GFR calc non Af Amer: >60 mL/min (ref >60)
Glucose, Bld: 92 mg/dL (ref 70–99)
Potassium: 3.6 mmol/L (ref 3.5–5.1)
Sodium: 136 mmol/L (ref 135–145)
Total Bilirubin: 0.3 mg/dL (ref 0.3–1.2)
Total Protein: 5.8 g/dL — ABNORMAL LOW (ref 6.5–8.1)

## 2018-10-28 LAB — CBC
HCT: 29.9 % — ABNORMAL LOW (ref 36.0–46.0)
Hemoglobin: 9.4 g/dL — ABNORMAL LOW (ref 12.0–15.0)
MCH: 24.2 pg — ABNORMAL LOW (ref 26.0–34.0)
MCHC: 31.4 g/dL (ref 30.0–36.0)
MCV: 77.1 fL — ABNORMAL LOW (ref 80.0–100.0)
Platelets: 269 10*3/uL (ref 150–400)
RBC: 3.88 MIL/uL (ref 3.87–5.11)
RDW: 15.4 % (ref 11.5–15.5)
WBC: 6.8 10*3/uL (ref 4.0–10.5)
nRBC: 0 % (ref 0.0–0.2)

## 2018-10-28 LAB — URINALYSIS, ROUTINE W REFLEX MICROSCOPIC
Bilirubin Urine: NEGATIVE
Glucose, UA: NEGATIVE mg/dL
Hgb urine dipstick: NEGATIVE
Ketones, ur: NEGATIVE mg/dL
Leukocytes,Ua: NEGATIVE
Nitrite: NEGATIVE
Protein, ur: NEGATIVE mg/dL
Specific Gravity, Urine: 1.013 (ref 1.005–1.030)
pH: 6 (ref 5.0–8.0)

## 2018-10-28 MED ORDER — ACETAMINOPHEN 500 MG PO TABS
1000.0000 mg | ORAL_TABLET | Freq: Once | ORAL | Status: AC
  Administered 2018-10-28: 16:00:00 1000 mg via ORAL
  Filled 2018-10-28: qty 2

## 2018-10-28 MED ORDER — FERROUS SULFATE 325 (65 FE) MG PO TABS: 325.0000 mg | ORAL_TABLET | Freq: Every day | ORAL | 1 refills | Status: DC

## 2018-10-28 NOTE — MAU Provider Note (Addendum)
History     CSN: [id number]  Arrival date and time: 10/28/18 1432   First Provider Initiated Contact with Patient 10/28/18 1553      Chief Complaint  Patient presents with  . Hypertension   Krystal Hudson is a 23 yr old G1P0000 at 325w4d who was sent from her regularly scheduled prenatal appointment to the MAU for a BP of 133/85, headache, blurry vision/seeing spots, and upper abdominal pain possibly concerning for preeclampsia. Over the past week, pt has had two seperate episodes of seeing black spots, once when driving and once when sitting at home that lasted only a short time, as well as two instances of blurry vision that occurred at random. She does not wear glasses or contacts and had a normal eye exam last year.  Pt is also reporting a HA today that she describes as mild and rates as 4/10. Prior to her pregnancy, Krystal Hudson would get tension headaches and take Tylenol or Ibuprofen with relief. Starting at about 6 months of pregnancy, Krystal Hudson had a increase in frequency and worsening of her headaches, including photophobia and nausea. These headaches are a 10/10, and they go away after she lays down and rests. She has not tried any medication for these headaches.  In MAU, pt denies abdominal pain but reports her upper abdomen "randomly gets stiff" and feels like it is tightening for the past three days. However, this is not painful. Denies contractions or decrease in fetal movement. Denies vaginal bleeding and abnormal vaginal discharge.   Denies dizziness, chest pain, SOB, N/V, abdominal pain, face swelling, worsening hand swelling, urinary urgency, dysuria, changes in bowel movements, and rapid weight gain.  Allergies? NKDA Current medications? PNVs Current PNC & next appt? [facility], 11/03/2018   OB History    Gravida  1   Para      Term      Preterm      AB      Living        SAB      TAB      Ectopic      Multiple      Live Births              Past  Medical History:  Diagnosis Date  . Medical history non-contributory     Past Surgical History:  Procedure Laterality Date  . NO PAST SURGERIES      History reviewed. No pertinent family history.  Social History   Tobacco Use  . Smoking status: Never Smoker  . Smokeless tobacco: Never Used  Substance Use Topics  . Alcohol use: Never    Frequency: Never  . Drug use: Never    Allergies: No Known Allergies  Medications Prior to Admission  Medication Sig Dispense Refill Last Dose  . Prenatal Vit-Fe Fumarate-FA (MULTIVITAMIN-PRENATAL) 27-0.8 MG TABS tablet Take 1 tablet by mouth daily at 12 noon.   10/28/2018 at Unknown time  . cyclobenzaprine (FLEXERIL) 10 MG tablet Take 1 tablet (10 mg total) by mouth 2 (two) times daily as needed for muscle spasms (Migraine Headaches). (Patient not taking: Reported on 08/27/2018) 60 tablet 0    Review of Systems  Constitutional: Negative for chills, diaphoresis, fatigue, fever and unexpected weight change.  Eyes: Positive for visual disturbance (two episodes of seeing black spots and a few epidsodes of blurry vision in the past week). Negative for photophobia (with migraines).  Respiratory: Negative for shortness of breath.   Cardiovascular: Negative for chest pain.  Gastrointestinal: Negative  for abdominal pain, constipation, diarrhea, nausea and vomiting.  Genitourinary: Negative for decreased urine volume, difficulty urinating, dysuria, pelvic pain, urgency, vaginal bleeding, vaginal discharge and vaginal pain.  Neurological: Positive for headaches. Negative for dizziness, weakness and light-headedness.   Physical Exam   Blood pressure 123/75, pulse 99, temperature 98.8 F (37.1 C), resp. rate 18, height 5\' 4"  (1.626 m), last menstrual period 02/14/2018, SpO2 98 %.  Patient Vitals for the past 24 hrs:  BP Temp Pulse Resp SpO2 Height  10/28/18 1730 123/75 - 99 - 98 % -  10/28/18 1715 115/68 - 96 - 97 % -  10/28/18 1700 123/76 - (!) 103 -  98 % -  10/28/18 1645 122/66 - 99 - 97 % -  10/28/18 1630 125/63 - 93 - 98 % -  10/28/18 1615 112/68 - 91 - 97 % -  10/28/18 1601 119/68 - 94 - - -  10/28/18 1546 119/68 - 94 - - -  10/28/18 1531 112/64 - 88 - - -  10/28/18 1526 (!) 124/52 - 92 - - -  10/28/18 1508 118/64 98.8 F (37.1 C) 97 18 - 5\' 4"  (1.626 m)   Physical Exam  Constitutional: She is oriented to person, place, and time. She appears well-developed and well-nourished. No distress.  HENT:  Head: Normocephalic and atraumatic.  Cardiovascular: Normal rate, regular rhythm and normal heart sounds.  GI: Soft. Bowel sounds are normal. She exhibits no distension and no mass. There is no abdominal tenderness. There is no rebound and no guarding.  Neurological: She is alert and oriented to person, place, and time.  Skin: Skin is warm and dry. She is not diaphoretic.  Psychiatric: She has a normal mood and affect. Her behavior is normal. Judgment and thought content normal.   Results for orders placed or performed during the hospital encounter of 10/28/18 (from the past 24 hour(s))  Protein / creatinine ratio, urine     Status: None   Collection Time: 10/28/18  2:38 PM  Result Value Ref Range   Creatinine, Urine 112.36 mg/dL   Total Protein, Urine 8 mg/dL   Protein Creatinine Ratio 0.07 0.00 - 0.15 mg/mg[Cre]  CBC     Status: Abnormal   Collection Time: 10/28/18  3:04 PM  Result Value Ref Range   WBC 6.8 4.0 - 10.5 K/uL   RBC 3.88 3.87 - 5.11 MIL/uL   Hemoglobin 9.4 (L) 12.0 - 15.0 g/dL   HCT 29.9 (L) 36.0 - 46.0 %   MCV 77.1 (L) 80.0 - 100.0 fL   MCH 24.2 (L) 26.0 - 34.0 pg   MCHC 31.4 30.0 - 36.0 g/dL   RDW 15.4 11.5 - 15.5 %   Platelets 269 150 - 400 K/uL   nRBC 0.0 0.0 - 0.2 %  Comprehensive metabolic panel     Status: Abnormal   Collection Time: 10/28/18  3:04 PM  Result Value Ref Range   Sodium 136 135 - 145 mmol/L   Potassium 3.6 3.5 - 5.1 mmol/L   Chloride 107 98 - 111 mmol/L   CO2 19 (L) 22 - 32 mmol/L    Glucose, Bld 92 70 - 99 mg/dL   BUN <5 (L) 6 - 20 mg/dL   Creatinine, Ser 0.46 0.44 - 1.00 mg/dL   Calcium 8.4 (L) 8.9 - 10.3 mg/dL   Total Protein 5.8 (L) 6.5 - 8.1 g/dL   Albumin 2.4 (L) 3.5 - 5.0 g/dL   AST 18 15 - 41 U/L   ALT 13  0 - 44 U/L   Alkaline Phosphatase 110 38 - 126 U/L   Total Bilirubin 0.3 0.3 - 1.2 mg/dL   GFR calc non Af Amer >60 >60 mL/min   GFR calc Af Amer >60 >60 mL/min   Anion gap 10 5 - 15  Urinalysis, Routine w reflex microscopic     Status: None   Collection Time: 10/28/18  4:35 PM  Result Value Ref Range   Color, Urine YELLOW YELLOW   APPearance CLEAR CLEAR   Specific Gravity, Urine 1.013 1.005 - 1.030   pH 6.0 5.0 - 8.0   Glucose, UA NEGATIVE NEGATIVE mg/dL   Hgb urine dipstick NEGATIVE NEGATIVE   Bilirubin Urine NEGATIVE NEGATIVE   Ketones, ur NEGATIVE NEGATIVE mg/dL   Protein, ur NEGATIVE NEGATIVE mg/dL   Nitrite NEGATIVE NEGATIVE   Leukocytes,Ua NEGATIVE NEGATIVE   [place] [facility] Ob Follow Up  Result Date: 10/22/2018 ----------------------------------------------------------------------  OBSTETRICS REPORT                       (Signed Final 10/22/2018 01:42 pm) ---------------------------------------------------------------------- Patient Info  ID #:       [id number]                          D.O.B.:  10-17-1995 (23 yrs)  Name:       Krystal Hudson                  Visit Date: 10/21/2018 01:16 pm ---------------------------------------------------------------------- Performed By  Performed By:     Emeline Darling BS,      Ref. Address:      [address]                                                              [place], [place]                                                              [address]  Attending:        Lin Landsman      [facility]  Referred By:      Dia Sitter  DAWSON CNM ---------------------------------------------------------------------- Orders   #  Description                          Code         Ordered By   1  [place] [facility] OB FOLLOW UP                  [id number]     Lin Landsman  ----------------------------------------------------------------------   #  Order #                    Accession #                 Episode #   1  [id number]                  [id number]                  [id number]  ---------------------------------------------------------------------- Indications   [redacted] weeks gestation of pregnancy                Z3A.35   Obesity complicating pregnancy, second         O99.212   trimester (BMI 52)   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 Cord Insertion:       Previously Visualized  Amniotic Fluid  AFI FV:      Within normal limits  AFI Sum(cm)     %Tile       Largest Pocket(cm)  15.52           57          5.47  RUQ(cm)       RLQ(cm)       LUQ(cm)        LLQ(cm)  5.47          2.53          2.35           5.17 ---------------------------------------------------------------------- Biometry  BPD:      93.5  mm     G. Age:  38w 0d         98  %    CI:          81.1  %  70 - 86                                                          FL/HC:       21.2  %    20.1 - 22.1  HC:      327.8  mm     G. Age:  37w 2d         59  %    HC/AC:       0.94       0.93 - 1.11  AC:      349.7  mm     G. Age:  38w 6d        > 99  %    FL/BPD:      74.3  %    71 - 87  FL:       69.5  mm     G. Age:  35w 5d         46  %    FL/AC:       19.9  %    20 - 24  Est. FW:    3332   gm     7 lb 6 oz     96  % ---------------------------------------------------------------------- OB History  Gravidity:    1 ---------------------------------------------------------------------- Gestational Age  LMP:           35w 4d        Date:  02/14/18                 EDD:   11/21/18  U/S Today:     37w 3d                                        EDD:   11/08/18  Best:          35w 4d     Det. By:  LMP  (02/14/18)          EDD:   11/21/18 ---------------------------------------------------------------------- Anatomy  Cranium:               Appears normal         Aortic Arch:            Previously seen  Cavum:                 Appears normal         Ductal Arch:            Not well visualized  Ventricles:            Appears normal         Diaphragm:              Previously seen  Choroid Plexus:        Previously seen        Stomach:                Appears normal, left  sided  Cerebellum:            Previously seen        Abdomen:                Previously seen  Posterior Fossa:       Previously seen        Abdominal Wall:         Previously seen  Nuchal Fold:           Previously seen        Cord Vessels:           Previously seen  Face:                  Orbits and profile     Kidneys:                Appear normal                         previously seen  Lips:                  Previously seen        Bladder:                Appears normal  Thoracic:              Appears normal         Spine:                  Previously seen  Heart:                 Previously seen        Upper Extremities:      Previously seen  RVOT:                  Previously seen        Lower Extremities:      Previously seen  LVOT:                  Previously seen  Other:  Feet previously visualized. Technically difficult due  to maternal          habitus and fetal position. ---------------------------------------------------------------------- Cervix Uterus Adnexa  Cervix  Not visualized (advanced GA >24wks) ---------------------------------------------------------------------- Impression  Normal interval growth.  BMI >50  AC 99th  Good fetal movement and amniotic fluid ---------------------------------------------------------------------- Recommendations  Follow up growth as clinically indicated ----------------------------------------------------------------------               Lin Landsmanorenthian Booker, MD Electronically Signed Final Report   10/22/2018 01:42 pm ----------------------------------------------------------------------  MAU Course  Procedures  MDM -preeclampsia work-up -normal pressures in MAU with current HA rated 4/10, visual disturbance x2 in past week -UA: WNL -CBC: H/H 9.4/29.9, platelets 269, pt not on iron, will start today -CMP: no abnormalities requiring treatment, AST/ALT 18/13 -PCr: 0.07 -1000mg  Tylenol given for HA, pt now rates as 1/10 and states it is "going away" -EFM: reactive       -baseline: 130       -variability: moderate       -accels: present, 15x15       -decels: absent       -TOCO: irritability, few, irregular ctx that pt is not feeling -pt discharged to home in stable condition  Orders Placed This Encounter  Procedures  . CBC    Standing Status:   Standing    Number of Occurrences:  1  . Comprehensive metabolic panel    Standing Status:   Standing    Number of Occurrences:   1  . Protein / creatinine ratio, urine    Standing Status:   Standing    Number of Occurrences:   1  . Urinalysis, Routine w reflex microscopic    Standing Status:   Standing    Number of Occurrences:   1  . Discharge patient    Order Specific Question:   Discharge disposition    Answer:   01-Home or Self Care [1]    Order Specific Question:   Discharge patient date    Answer:   10/28/2018    Meds ordered this encounter  Medications  . acetaminophen (TYLENOL) tablet 1,000 mg  . ferrous sulfate 325 (65 FE) MG tablet    Sig: Take 1 tablet (325 mg total) by mouth daily.    Dispense:  30 tablet    Refill:  1    Order Specific Question:   Supervising Provider    Answer:   Conan Bowens [[id number]]   Assessment and Plan   1. Pregnancy headache in third trimester   2. Anemia affecting pregnancy in third trimester   3. Visual disturbance   4. [redacted] weeks gestation of pregnancy   5. NST (non-stress test) reactive    Allergies as of 10/28/2018   No Known Allergies     Medication List    TAKE these medications   cyclobenzaprine 10 MG tablet Commonly known as: FLEXERIL Take 1 tablet (10 mg total) by mouth 2 (two) times daily as needed for muscle spasms (Migraine Headaches).   ferrous sulfate 325 (65 FE) MG tablet Take 1 tablet (325 mg total) by mouth daily.   multivitamin-prenatal 27-0.8 MG Tabs tablet Take 1 tablet by mouth daily at 12 noon.      -Start taking iron supplements -Continue taking prenatal vitamins -keep regular OB appt, as scheduled -strict preeclampsia/VB/pain/labor/LOF/return MAU precautions given -pt discharged to home in stable condition  Joni Reining E Nugent 10/28/2018, 5:46 PM   I confirm that I have verified the information documented in the medical student's note and that I have also personally reperformed the history, physical exam and all medical decision making activities of this service and have verified that all service and findings are accurately documented in this student's note.   Marylen Ponto, NP 10/28/2018 5:46 PM

## 2018-10-28 NOTE — Patient Instructions (Addendum)
REPORT TO MATERNITY ASSESSMENT UNIT AT [facility] BE EVALUATED FOR PRE-ECLAMPSIA OR GESTATIONAL HYPERTENSION   Hypertension During Pregnancy High blood pressure (hypertension) is when the force of blood pumping through the arteries is too strong. Arteries are blood vessels that carry blood from the heart throughout the body. Hypertension during pregnancy can be mild or severe. Severe hypertension during pregnancy (preeclampsia) is a medical emergency that requires prompt evaluation and treatment. Different types of hypertension can happen during pregnancy. These include:  Chronic hypertension. This happens when you had high blood pressure before you became pregnant, and it continues during the pregnancy. Hypertension that develops before you are [redacted] weeks pregnant and continues during the pregnancy is also called chronic hypertension. If you have chronic hypertension, it will not go away after you have your baby. You will need follow-up visits with your health care provider after you have your baby. Your doctor may want you to keep taking medicine for your blood pressure.  Gestational hypertension. This is hypertension that develops after the 20th week of pregnancy. Gestational hypertension usually goes away after you have your baby, but your health care provider will need to monitor your blood pressure to make sure that it is getting better.  Preeclampsia. This is severe hypertension during pregnancy. This can cause serious complications for you and your baby and can also cause complications for you after the delivery of your baby.  Postpartum preeclampsia. You may develop severe hypertension after giving birth. This usually occurs within 48 hours after childbirth but may occur up to 6 weeks after giving birth. This is rare. How does this affect me? Women who have hypertension during pregnancy have a greater chance of developing hypertension later in life or during  future pregnancies. In some cases, hypertension during pregnancy can cause serious complications, such as:  Stroke.  Heart attack.  Injury to other organs, such as kidneys, lungs, or liver.  Preeclampsia.  Convulsions or seizures.  Placental abruption. How does this affect my baby? Hypertension during pregnancy can affect your baby. Your baby may:  Be born early (prematurely).  Not weigh as much as he or she should at birth (low birth weight).  Not tolerate labor well, leading to an unplanned cesarean delivery. What are the risks? There are certain factors that make it more likely for you to develop hypertension during pregnancy. These include:  Having hypertension during a previous pregnancy.  Being overweight.  Being age 23 or older.  Being pregnant for the first time.  Being pregnant with more than one baby.  Becoming pregnant using fertilization methods, such as IVF (in vitro fertilization).  Having other medical problems, such as diabetes, kidney disease, or lupus.  Having a family history of hypertension. What can I do to lower my risk? The exact cause of hypertension during pregnancy is not known. You may be able to lower your risk by:  Maintaining a healthy weight.  Eating a healthy and balanced diet.  Following your health care provider's instructions about treating any long-term conditions that you had before becoming pregnant. It is very important to keep all of your prenatal care appointments. Your health care provider will check your blood pressure and make sure that your pregnancy is progressing as expected. If a problem is found, early treatment can prevent complications. How is this treated? Treatment for hypertension during pregnancy varies depending on the type of hypertension you have and how serious it is.  If you were taking medicine  for high blood pressure before you became pregnant, talk with your health care provider. You may need to change  medicine during pregnancy because some medicines, like ACE inhibitors, may not be considered safe for your baby.  If you have gestational hypertension, your health care provider may order medicine to treat this during pregnancy.  If you are at risk for preeclampsia, your health care provider may recommend that you take a low-dose aspirin during your pregnancy.  If you have severe hypertension, you may need to be hospitalized so you and your baby can be monitored closely. You may also need to be given medicine to lower your blood pressure. This medicine may be given by mouth or through an IV.  In some cases, if your condition gets worse, you may need to deliver your baby early. Follow these instructions at home: Eating and drinking   Drink enough fluid to keep your urine pale yellow.  Avoid caffeine. Lifestyle  Do not use any products that contain nicotine or tobacco, such as cigarettes, e-cigarettes, and chewing tobacco. If you need help quitting, ask your health care provider.  Do not use alcohol or drugs.  Avoid stress as much as possible.  Rest and get plenty of sleep.  Regular exercise can help to reduce your blood pressure. Ask your health care provider what kinds of exercise are best for you. General instructions  Take over-the-counter and prescription medicines only as told by your health care provider.  Keep all prenatal and follow-up visits as told by your health care provider. This is important. Contact a health care provider if:  You have symptoms that your health care provider told you may require more treatment or monitoring, such as: ? Headaches. ? Nausea or vomiting. ? Abdominal pain. ? Dizziness. ? Light-headedness. Get help right away if:  You have: ? Severe abdominal pain that does not get better with treatment. ? A severe headache that does not get better. ? Vomiting that does not get better. ? Sudden, rapid weight gain. ? Sudden swelling in your  hands, ankles, or face. ? Vaginal bleeding. ? Blood in your urine. ? Blurred or double vision. ? Shortness of breath or chest pain. ? Weakness on one side of your body. ? Difficulty speaking.  Your baby is not moving as much as usual. Summary  High blood pressure (hypertension) is when the force of blood pumping through the arteries is too strong.  Hypertension during pregnancy can cause problems for you and your baby.  Treatment for hypertension during pregnancy varies depending on the type of hypertension you have and how serious it is.  Keep all prenatal and follow-up visits as told by your health care provider. This is important. This information is not intended to replace advice given to you by your health care provider. Make sure you discuss any questions you have with your health care provider. Document Released: 10/22/2010 Document Revised: 05/27/2018 Document Reviewed: 03/02/2018 Elsevier Patient Education  2020 [employer]  Preeclampsia and Eclampsia Preeclampsia is a serious condition that may develop during pregnancy. This condition causes high blood pressure and increased protein in your urine along with other symptoms, such as headaches and vision changes. These symptoms may develop as the condition gets worse. Preeclampsia may occur at 20 weeks of pregnancy or later. Diagnosing and treating preeclampsia early is very important. If not treated early, it can cause serious problems for you and your baby. One problem it can lead to is eclampsia. Eclampsia is a condition that  causes muscle jerking or shaking (convulsions or seizures) and other serious problems for the mother. During pregnancy, delivering your baby may be the best treatment for preeclampsia or eclampsia. For most women, preeclampsia and eclampsia symptoms go away after giving birth. In rare cases, a woman may develop preeclampsia after giving birth (postpartum preeclampsia). This usually occurs within 48 hours  after childbirth but may occur up to 6 weeks after giving birth. What are the causes? The cause of preeclampsia is not known. What increases the risk? The following risk factors make you more likely to develop preeclampsia:  Being pregnant for the first time.  Having had preeclampsia during a past pregnancy.  Having a family history of preeclampsia.  Having high blood pressure.  Being pregnant with more than one baby.  Being 8935 or older.  Being African-American.  Having kidney disease or diabetes.  Having medical conditions such as lupus or blood diseases.  Being very overweight (obese). What are the signs or symptoms? The most common symptoms are:  Severe headaches.  Vision problems, such as blurred or double vision.  Abdominal pain, especially upper abdominal pain. Other symptoms that may develop as the condition gets worse include:  Sudden weight gain.  Sudden swelling of the hands, face, legs, and feet.  Severe nausea and vomiting.  Numbness in the face, arms, legs, and feet.  Dizziness.  Urinating less than usual.  Slurred speech.  Convulsions or seizures. How is this diagnosed? There are no screening tests for preeclampsia. Your health care provider will ask you about symptoms and check for signs of preeclampsia during your prenatal visits. You may also have tests that include:  Checking your blood pressure.  Urine tests to check for protein. Your health care provider will check for this at every prenatal visit.  Blood tests.  Monitoring your baby's heart rate.  Ultrasound. How is this treated? You and your health care provider will determine the treatment approach that is best for you. Treatment may include:  Having more frequent prenatal exams to check for signs of preeclampsia, if you have an increased risk for preeclampsia.  Medicine to lower your blood pressure.  Staying in the hospital, if your condition is severe. There, treatment will  focus on controlling your blood pressure and the amount of fluids in your body (fluid retention).  Taking medicine (magnesium sulfate) to prevent seizures. This may be given as an injection or through an IV.  Taking a low-dose aspirin during your pregnancy.  Delivering your baby early. You may have your labor started with medicine (induced), or you may have a cesarean delivery. Follow these instructions at home: Eating and drinking   Drink enough fluid to keep your urine pale yellow.  Avoid caffeine. Lifestyle  Do not use any products that contain nicotine or tobacco, such as cigarettes and e-cigarettes. If you need help quitting, ask your health care provider.  Do not use alcohol or drugs.  Avoid stress as much as possible. Rest and get plenty of sleep. General instructions  Take over-the-counter and prescription medicines only as told by your health care provider.  When lying down, lie on your left side. This keeps pressure off your major blood vessels.  When sitting or lying down, raise (elevate) your feet. Try putting some pillows underneath your lower legs.  Exercise regularly. Ask your health care provider what kinds of exercise are best for you.  Keep all follow-up and prenatal visits as told by your health care provider. This is important. How  is this prevented? There is no known way of preventing preeclampsia or eclampsia from developing. However, to lower your risk of complications and detect problems early:  Get regular prenatal care. Your health care provider may be able to diagnose and treat the condition early.  Maintain a healthy weight. Ask your health care provider for help managing weight gain during pregnancy.  Work with your health care provider to manage any long-term (chronic) health conditions you have, such as diabetes or kidney problems.  You may have tests of your blood pressure and kidney function after giving birth.  Your health care provider may  have you take low-dose aspirin during your next pregnancy. Contact a health care provider if:  You have symptoms that your health care provider told you may require more treatment or monitoring, such as: ? Headaches. ? Nausea or vomiting. ? Abdominal pain. ? Dizziness. ? Light-headedness. Get help right away if:  You have severe: ? Abdominal pain. ? Headaches that do not get better. ? Dizziness. ? Vision problems. ? Confusion. ? Nausea or vomiting.  You have any of the following: ? A seizure. ? Sudden, rapid weight gain. ? Sudden swelling in your hands, ankles, or face. ? Trouble moving any part of your body. ? Numbness in any part of your body. ? Trouble speaking. ? Abnormal bleeding.  You faint. Summary  Preeclampsia is a serious condition that may develop during pregnancy.  This condition causes high blood pressure and increased protein in your urine along with other symptoms, such as headaches and vision changes.  Diagnosing and treating preeclampsia early is very important. If not treated early, it can cause serious problems for you and your baby.  Get help right away if you have symptoms that your health care provider told you to watch for. This information is not intended to replace advice given to you by your health care provider. Make sure you discuss any questions you have with your health care provider. Document Released: 02/01/2000 Document Revised: 10/06/2017 Document Reviewed: 09/10/2015 Elsevier Patient Education  2020 [employer].

## 2018-10-28 NOTE — Discharge Instructions (Signed)
Signs and Symptoms of Labor Labor is your body's natural process of moving your baby, placenta, and umbilical cord out of your uterus. The process of labor usually starts when your baby is full-term, between 37 and 40 weeks of pregnancy. How will I know when I am close to going into labor? As your body prepares for labor and the birth of your baby, you may notice the following symptoms in the weeks and days before true labor starts:  Having a strong desire to get your home ready to receive your new baby. This is called nesting. Nesting may be a sign that labor is approaching, and it may occur several weeks before birth. Nesting may involve cleaning and organizing your home.  Passing a small amount of thick, bloody mucus out of your vagina (normal bloody show or losing your mucus plug). This may happen more than a week before labor begins, or it might occur right before labor begins as the opening of the cervix starts to widen (dilate). For some women, the entire mucus plug passes at once. For others, smaller portions of the mucus plug may gradually pass over several days.  Your baby moving (dropping) lower in your pelvis to get into position for birth (lightening). When this happens, you may feel more pressure on your bladder and pelvic bone and less pressure on your ribs. This may make it easier to breathe. It may also cause you to need to urinate more often and have problems with bowel movements.  Having "practice contractions" (Braxton Hicks contractions) that occur at irregular (unevenly spaced) intervals that are more than 10 minutes apart. This is also called false labor. False labor contractions are common after exercise or sexual activity, and they will stop if you change position, rest, or drink fluids. These contractions are usually mild and do not get stronger over time. They may feel like: ? A backache or back pain. ? Mild cramps, similar to menstrual cramps. ? Tightening or pressure in  your abdomen. Other early symptoms that labor may be starting soon include:  Nausea or loss of appetite.  Diarrhea.  Having a sudden burst of energy, or feeling very tired.  Mood changes.  Having trouble sleeping. How will I know when labor has begun? Signs that true labor has begun may include:  Having contractions that come at regular (evenly spaced) intervals and increase in intensity. This may feel like more intense tightening or pressure in your abdomen that moves to your back. ? Contractions may also feel like rhythmic pain in your upper thighs or back that comes and goes at regular intervals. ? For first-time mothers, this change in intensity of contractions often occurs at a more gradual pace. ? Women who have given birth before may notice a more rapid progression of contraction changes.  Having a feeling of pressure in the vaginal area.  Your water breaking (rupture of membranes). This is when the sac of fluid that surrounds your baby breaks. When this happens, you will notice fluid leaking from your vagina. This may be clear or blood-tinged. Labor usually starts within 24 hours of your water breaking, but it may take longer to begin. ? Some women notice this as a gush of fluid. ? Others notice that their underwear repeatedly becomes damp. Follow these instructions at home:   When labor starts, or if your water breaks, call your health care provider or nurse care line. Based on your situation, they will determine when you should go in for an   exam.  When you are in early labor, you may be able to rest and manage symptoms at home. Some strategies to try at home include: ? Breathing and relaxation techniques. ? Taking a warm bath or shower. ? Listening to music. ? Using a heating pad on the lower back for pain. If you are directed to use heat:  Place a towel between your skin and the heat source.  Leave the heat on for 20-30 minutes.  Remove the heat if your skin turns  bright red. This is especially important if you are unable to feel pain, heat, or cold. You may have a greater risk of getting burned. Get help right away if:  You have painful, regular contractions that are 5 minutes apart or less.  Labor starts before you are [redacted] weeks along in your pregnancy.  You have a fever.  You have a headache that does not go away.  You have bright red blood coming from your vagina.  You do not feel your baby moving.  You have a sudden onset of: ? Severe headache with vision problems. ? Nausea, vomiting, or diarrhea. ? Chest pain or shortness of breath. These symptoms may be an emergency. If your health care provider recommends that you go to the hospital or birth center where you plan to deliver, do not drive yourself. Have someone else drive you, or call emergency services (911 in the [place] Summary  Labor is your body's natural process of moving your baby, placenta, and umbilical cord out of your uterus.  The process of labor usually starts when your baby is full-term, between 26 and 40 weeks of pregnancy.  When labor starts, or if your water breaks, call your health care provider or nurse care line. Based on your situation, they will determine when you should go in for an exam. This information is not intended to replace advice given to you by your health care provider. Make sure you discuss any questions you have with your health care provider. Document Released: 07/11/2016 Document Revised: 11/03/2016 Document Reviewed: 07/11/2016 Elsevier Patient Education  [place]. Preeclampsia and Eclampsia Preeclampsia is a serious condition that may develop during pregnancy. This condition causes high blood pressure and increased protein in your urine along with other symptoms, such as headaches and vision changes. These symptoms may develop as the condition gets worse. Preeclampsia may occur at 20 weeks of pregnancy or later. Diagnosing and treating  preeclampsia early is very important. If not treated early, it can cause serious problems for you and your baby. One problem it can lead to is eclampsia. Eclampsia is a condition that causes muscle jerking or shaking (convulsions or seizures) and other serious problems for the mother. During pregnancy, delivering your baby may be the best treatment for preeclampsia or eclampsia. For most women, preeclampsia and eclampsia symptoms go away after giving birth. In rare cases, a woman may develop preeclampsia after giving birth (postpartum preeclampsia). This usually occurs within 48 hours after childbirth but may occur up to 6 weeks after giving birth. What are the causes? The cause of preeclampsia is not known. What increases the risk? The following risk factors make you more likely to develop preeclampsia:  Being pregnant for the first time.  Having had preeclampsia during a past pregnancy.  Having a family history of preeclampsia.  Having high blood pressure.  Being pregnant with more than one baby.  Being 60 or older.  Being African-American.  Having kidney disease or diabetes.  Having  medical conditions such as lupus or blood diseases.  Being very overweight (obese). What are the signs or symptoms? The most common symptoms are:  Severe headaches.  Vision problems, such as blurred or double vision.  Abdominal pain, especially upper abdominal pain. Other symptoms that may develop as the condition gets worse include:  Sudden weight gain.  Sudden swelling of the hands, face, legs, and feet.  Severe nausea and vomiting.  Numbness in the face, arms, legs, and feet.  Dizziness.  Urinating less than usual.  Slurred speech.  Convulsions or seizures. How is this diagnosed? There are no screening tests for preeclampsia. Your health care provider will ask you about symptoms and check for signs of preeclampsia during your prenatal visits. You may also have tests that  include:  Checking your blood pressure.  Urine tests to check for protein. Your health care provider will check for this at every prenatal visit.  Blood tests.  Monitoring your baby's heart rate.  Ultrasound. How is this treated? You and your health care provider will determine the treatment approach that is best for you. Treatment may include:  Having more frequent prenatal exams to check for signs of preeclampsia, if you have an increased risk for preeclampsia.  Medicine to lower your blood pressure.  Staying in the hospital, if your condition is severe. There, treatment will focus on controlling your blood pressure and the amount of fluids in your body (fluid retention).  Taking medicine (magnesium sulfate) to prevent seizures. This may be given as an injection or through an IV.  Taking a low-dose aspirin during your pregnancy.  Delivering your baby early. You may have your labor started with medicine (induced), or you may have a cesarean delivery. Follow these instructions at home: Eating and drinking   Drink enough fluid to keep your urine pale yellow.  Avoid caffeine. Lifestyle  Do not use any products that contain nicotine or tobacco, such as cigarettes and e-cigarettes. If you need help quitting, ask your health care provider.  Do not use alcohol or drugs.  Avoid stress as much as possible. Rest and get plenty of sleep. General instructions  Take over-the-counter and prescription medicines only as told by your health care provider.  When lying down, lie on your left side. This keeps pressure off your major blood vessels.  When sitting or lying down, raise (elevate) your feet. Try putting some pillows underneath your lower legs.  Exercise regularly. Ask your health care provider what kinds of exercise are best for you.  Keep all follow-up and prenatal visits as told by your health care provider. This is important. How is this prevented? There is no known way  of preventing preeclampsia or eclampsia from developing. However, to lower your risk of complications and detect problems early:  Get regular prenatal care. Your health care provider may be able to diagnose and treat the condition early.  Maintain a healthy weight. Ask your health care provider for help managing weight gain during pregnancy.  Work with your health care provider to manage any long-term (chronic) health conditions you have, such as diabetes or kidney problems.  You may have tests of your blood pressure and kidney function after giving birth.  Your health care provider may have you take low-dose aspirin during your next pregnancy. Contact a health care provider if:  You have symptoms that your health care provider told you may require more treatment or monitoring, such as: ? Headaches. ? Nausea or vomiting. ? Abdominal pain. ?  Dizziness. °? Light-headedness. °Get help right away if: °· You have severe: °? Abdominal pain. °? Headaches that do not get better. °? Dizziness. °? Vision problems. °? Confusion. °? Nausea or vomiting. °· You have any of the following: °? A seizure. °? Sudden, rapid weight gain. °? Sudden swelling in your hands, ankles, or face. °? Trouble moving any part of your body. °? Numbness in any part of your body. °? Trouble speaking. °? Abnormal bleeding. °· You faint. °Summary °· Preeclampsia is a serious condition that may develop during pregnancy. °· This condition causes high blood pressure and increased protein in your urine along with other symptoms, such as headaches and vision changes. °· Diagnosing and treating preeclampsia early is very important. If not treated early, it can cause serious problems for you and your baby. °· Get help right away if you have symptoms that your health care provider told you to watch for. °This information is not intended to replace advice given to you by your health care provider. Make sure you discuss any questions you have  with your health care provider. °Document Released: 02/01/2000 Document Revised: 10/06/2017 Document Reviewed: 09/10/2015 °Elsevier Patient Education © 2020 Elsevier Inc. ° °

## 2018-10-28 NOTE — Progress Notes (Addendum)
   PRENATAL VISIT NOTE  Subjective:  Krystal Hudson is a 23 y.o. G1P0 at [redacted]w[redacted]d being seen today for ongoing prenatal care.  She is currently monitored for the following issues for this low-risk pregnancy and has Supervision of normal first pregnancy, antepartum on their problem list.  Patient reports headache and vision changes ("seeing spots before my eyes") x 1 week and pink watery d/c since  last week. She reports that she does have occasional H/As (none today) that started a week ago. Contractions: Irregular. Vag. Bleeding: None.  Movement: Present. Denies leaking of fluid.   The following portions of the patient's history were reviewed and updated as appropriate: allergies, current medications, past family history, past medical history, past social history, past surgical history and problem list.   Objective:   Vitals:   10/28/18 1321  BP: 133/85  Pulse: 98  Temp: 97.7 F (36.5 C)  Weight: (!) 307 lb 9.6 oz (139.5 kg)    Fetal Status: Fetal Heart Rate (bpm): 145 Fundal Height: 39 cm Movement: Present  Presentation: Undeterminable  General:  Alert, oriented and cooperative. Patient is in no acute distress.  Skin: Skin is warm and dry. No rash noted.   Cardiovascular: Normal heart rate noted  Respiratory: Normal respiratory effort, no problems with respiration noted  Abdomen: Soft, gravid, appropriate for gestational age.  Pain/Pressure: Absent     Pelvic: Cervical exam performed Dilation: Closed Effacement (%): Thick Station: Ballotable -- NO FLUID SEEN ON VISUAL EXAM  Extremities: Normal range of motion.  Edema: 1+ in BLE   Mental Status: Normal mood and affect. Normal behavior. Normal judgment and thought content.   Assessment and Plan:  Pregnancy: G1P0 at [redacted]w[redacted]d 1. Supervision of normal first pregnancy, antepartum - Culture, beta strep (group b only) - Cervicovaginal ancillary only( [place])  2. Elevated blood pressure affecting pregnancy in third trimester, antepartum  - Had 1 elevated systolic BP in MAU on 6/29 (146/67), but did not meet criteria for gHTN - Sending patient to MAU for PEC w/u d/t complaints of visual changes and swelling of ankles and occasional H/As  Noni Saupe, FNP notified of patient coming to MAU for evelauation - Information provided on PEC & gHTN   Preterm labor symptoms and general obstetric precautions including but not limited to vaginal bleeding, contractions, leaking of fluid and fetal movement were reviewed in detail with the patient. Please refer to After Visit Summary for other counseling recommendations.   Return in about 1 week (around 11/04/2018) for Return OB - My Chart video.  Future Appointments  Date Time Provider [place]  11/11/2018 10:10 AM Laury Deep, CNM [facility] None    Laury Deep, [place]

## 2018-10-30 LAB — CERVICOVAGINAL ANCILLARY ONLY
Bacterial vaginitis: NEGATIVE
Candida vaginitis: NEGATIVE
Chlamydia: NEGATIVE
Neisseria Gonorrhea: NEGATIVE
Trichomonas: NEGATIVE

## 2018-10-31 LAB — CULTURE, BETA STREP (GROUP B ONLY): Strep Gp B Culture: POSITIVE — AB

## 2018-11-03 ENCOUNTER — Encounter: Payer: Self-pay | Admitting: Certified Nurse Midwife

## 2018-11-03 ENCOUNTER — Ambulatory Visit (INDEPENDENT_AMBULATORY_CARE_PROVIDER_SITE_OTHER): Payer: Medicaid Other | Admitting: Certified Nurse Midwife

## 2018-11-03 VITALS — BP 117/74 | HR 74 | Wt 306.5 lb

## 2018-11-03 DIAGNOSIS — Z3A37 37 weeks gestation of pregnancy: Secondary | ICD-10-CM

## 2018-11-03 DIAGNOSIS — Z3400 Encounter for supervision of normal first pregnancy, unspecified trimester: Secondary | ICD-10-CM

## 2018-11-03 DIAGNOSIS — O3660X0 Maternal care for excessive fetal growth, unspecified trimester, not applicable or unspecified: Secondary | ICD-10-CM | POA: Insufficient documentation

## 2018-11-03 NOTE — Progress Notes (Signed)
   PRENATAL VISIT NOTE  Subjective:  Krystal Hudson is a 23 y.o. G1P0 at [redacted]w[redacted]d being seen today for ongoing prenatal care.  She is currently monitored for the following issues for this low-risk pregnancy and has Supervision of normal first pregnancy, antepartum; Elevated blood pressure affecting pregnancy in third trimester, antepartum; Anemia in pregnancy; and LGA (large for gestational age) fetus affecting management of mother on their problem list.  Patient reports occasional contractions.  Contractions: Irregular. Vag. Bleeding: None.  Movement: Present. Denies leaking of fluid.   The following portions of the patient's history were reviewed and updated as appropriate: allergies, current medications, past family history, past medical history, past social history, past surgical history and problem list.   Objective:   Vitals:   11/03/18 1555  BP: 117/74  Pulse: 74  Weight: (!) 306 lb 8 oz (139 kg)    Fetal Status: Fetal Heart Rate (bpm): 134 Fundal Height: 41 cm Movement: Present  Presentation: Undeterminable  General:  Alert, oriented and cooperative. Patient is in no acute distress.  Skin: Skin is warm and dry. No rash noted.   Cardiovascular: Normal heart rate noted  Respiratory: Normal respiratory effort, no problems with respiration noted  Abdomen: Soft, gravid, appropriate for gestational age.  Pain/Pressure: Absent     Pelvic: Cervical exam performed Dilation: Fingertip Effacement (%): Thick Station: Ballotable  Extremities: Normal range of motion.  Edema: Trace  Mental Status: Normal mood and affect. Normal behavior. Normal judgment and thought content.   Assessment and Plan:  Pregnancy: G1P0 at [redacted]w[redacted]d 1. Supervision of normal first pregnancy, antepartum - Patient doing well, no complaints - Reports occasional contractions  - Anticipatory guidance on upcoming appointments - Reviewed safety, visitor policy, reassurance about COVID-19 for pregnancy at this time. Discussed  possible changes to visits, including televisits, that may occur due to COVID-19.  The office remains open if pt needs to be seen and MAU is open 24 hours/day for OB emergencies. - Educated and discussed use of EPO and RRT for cervical ripening   2. Excessive fetal growth affecting management of pregnancy, antepartum, single or unspecified fetus - Suspected LGA  - f/u US on 9/3, Est. FW:    3332   gm   7 lb 6 oz   96  % at [redacted]w[redacted]d  - Leopold's est 8-9 lbs currently    Term labor symptoms and general obstetric precautions including but not limited to vaginal bleeding, contractions, leaking of fluid and fetal movement were reviewed in detail with the patient. Please refer to After Visit Summary for other counseling recommendations.    Future Appointments  Date Time Provider [place]  11/11/2018 10:10 AM Laury Deep, [place] None    Lajean Manes, CNM

## 2018-11-11 ENCOUNTER — Encounter: Payer: Self-pay | Admitting: Obstetrics and Gynecology

## 2018-11-11 ENCOUNTER — Inpatient Hospital Stay (HOSPITAL_COMMUNITY)
Admission: AD | Admit: 2018-11-11 | Discharge: 2018-11-11 | Disposition: A | Discharge status: Home / Self Care | Payer: Medicaid Other | Attending: Obstetrics and Gynecology | Admitting: Obstetrics and Gynecology

## 2018-11-11 ENCOUNTER — Telehealth: Payer: 59 | Admitting: Obstetrics and Gynecology

## 2018-11-11 ENCOUNTER — Encounter (HOSPITAL_COMMUNITY): Payer: Self-pay | Admitting: *Deleted

## 2018-11-11 ENCOUNTER — Other Ambulatory Visit: Payer: Self-pay

## 2018-11-11 ENCOUNTER — Ambulatory Visit (INDEPENDENT_AMBULATORY_CARE_PROVIDER_SITE_OTHER): Payer: Medicaid Other | Admitting: Obstetrics and Gynecology

## 2018-11-11 VITALS — BP 130/71 | HR 86 | Temp 98.3°F | Wt 308.4 lb

## 2018-11-11 DIAGNOSIS — O99013 Anemia complicating pregnancy, third trimester: Secondary | ICD-10-CM

## 2018-11-11 DIAGNOSIS — O163 Unspecified maternal hypertension, third trimester: Secondary | ICD-10-CM | POA: Diagnosis not present

## 2018-11-11 DIAGNOSIS — O3660X0 Maternal care for excessive fetal growth, unspecified trimester, not applicable or unspecified: Secondary | ICD-10-CM

## 2018-11-11 DIAGNOSIS — Z0371 Encounter for suspected problem with amniotic cavity and membrane ruled out: Secondary | ICD-10-CM | POA: Diagnosis not present

## 2018-11-11 DIAGNOSIS — O3663X0 Maternal care for excessive fetal growth, third trimester, not applicable or unspecified: Secondary | ICD-10-CM

## 2018-11-11 DIAGNOSIS — R879 Unspecified abnormal finding in specimens from female genital organs: Secondary | ICD-10-CM

## 2018-11-11 DIAGNOSIS — Z3A38 38 weeks gestation of pregnancy: Secondary | ICD-10-CM | POA: Diagnosis not present

## 2018-11-11 DIAGNOSIS — Z3400 Encounter for supervision of normal first pregnancy, unspecified trimester: Secondary | ICD-10-CM

## 2018-11-11 DIAGNOSIS — N898 Other specified noninflammatory disorders of vagina: Secondary | ICD-10-CM

## 2018-11-11 LAB — POCT FERN TEST: POCT Fern Test: NEGATIVE

## 2018-11-11 LAB — WET PREP, GENITAL
Clue Cells Wet Prep HPF POC: NONE SEEN
Sperm: NONE SEEN
Trich, Wet Prep: NONE SEEN
Yeast Wet Prep HPF POC: NONE SEEN

## 2018-11-11 NOTE — MAU Provider Note (Addendum)
      S: [name] is a 23 y.o. G1P0 at [redacted]w[redacted]d  who presents to MAU today complaining of leaking of fluid since yesterday morning. It was occasional throughout day, but then she didn't have any leaking overnight. Today, she had an episode of leaking. The discharge is clear, it has no odor. She did not have to change her underwear. She did not have to wear a pad. No recent intercourse. She denies contractions, NV, HA, blurry vision. She has had an uncomplicated pregnancy so far. She denies vaginal bleeding. She denies contractions. She reports normal fetal movement.    O: BP 122/62   Pulse 82   Temp 98.3 F (36.8 C)   Resp 18   LMP 02/14/2018 (Exact Date)  GENERAL: Well-developed, well-nourished female in no acute distress.  HEAD: Normocephalic, atraumatic.  CHEST: Normal effort of breathing, regular heart rate ABDOMEN: Soft, nontender, gravid PELVIC: Normal external female genitalia. Vagina is pink and rugated. Cervix with normal contour, no lesions. Normal discharge.  Negative pooling.   Cervical exam: closed, thick, posterior     Fetal Monitoring: Baseline: 130 Variability: mod Accelerations: present Decelerations: negative Contractions:none  Results for orders placed or performed during the hospital encounter of 11/11/18 (from the past 24 hour(s))  Wet prep, genital     Status: Abnormal   Collection Time: 11/11/18  1:51 PM   Specimen: Vaginal  Result Value Ref Range   Yeast Wet Prep HPF POC NONE SEEN NONE SEEN   Trich, Wet Prep NONE SEEN NONE SEEN   Clue Cells Wet Prep HPF POC NONE SEEN NONE SEEN   WBC, Wet Prep HPF POC MODERATE (A) NONE SEEN   Sperm NONE SEEN      A: SIUP at [redacted]w[redacted]d  Membranes intact -no ferning on the slide; recheck by RN shows that cervix is unchanged.   P: Discharge home; keep prenatal visit next week.   [place], [place] 11/11/2018 2:19 PM

## 2018-11-11 NOTE — MAU Note (Signed)
Pt sent from office to r/o SROM. Pt stated she has been having wetness/leaking since yesterday. Had to change underwear several times. Reports some abd cramping/ctx as well.

## 2018-11-11 NOTE — Progress Notes (Signed)
   PRENATAL VISIT NOTE  Subjective:  Krystal Hudson is a 23 y.o. G1P0 at [redacted]w[redacted]d being seen today for ongoing prenatal care.  She is currently monitored for the following issues for this low-risk pregnancy and has Supervision of normal first pregnancy, antepartum; Elevated blood pressure affecting pregnancy in third trimester, antepartum; Anemia in pregnancy; and LGA (large for gestational age) fetus affecting management of mother on their problem list.  Patient reports backache and pelvic pain and "leaking clear fluid with a 'weird' odor since yesterday" Denies any gush of fluid .  Contractions: Not present. Vag. Bleeding: None.  Movement: Present. Denies leaking of fluid.   The following portions of the patient's history were reviewed and updated as appropriate: allergies, current medications, past family history, past medical history, past social history, past surgical history and problem list.   Objective:   Vitals:   11/11/18 1025  BP: 130/71  Pulse: 86  Temp: 98.3 F (36.8 C)  Weight: (!) 308 lb 6.4 oz (139.9 kg)    Fetal Status: Fetal Heart Rate (bpm): 136 Fundal Height: 40 cm Movement: Present  Presentation: Undeterminable  General:  Alert, oriented and cooperative. Patient is in no acute distress.  Skin: Skin is warm and dry. No rash noted.   Cardiovascular: Normal heart rate noted  Respiratory: Normal respiratory effort, no problems with respiration noted  Abdomen: Soft, gravid, appropriate for gestational age.  Pain/Pressure: Present     Pelvic: Cervical exam deferred        Extremities: Normal range of motion.  Edema: None  Mental Status: Normal mood and affect. Normal behavior. Normal judgment and thought content.   Assessment and Plan:  Pregnancy: G1P0 at [redacted]w[redacted]d Abnormal vaginal fluids - Will send to MAU for Amnisure test and wet prep (supplies not available in-office) - Notified Vernice Jefferson, FNP of patient coming for evaluation  Excessive fetal growth affecting  management of pregnancy, antepartum, single or unspecified fetus - Suspected LGA  Anemia affecting pregnancy in third trimester   Term labor symptoms and general obstetric precautions including but not limited to vaginal bleeding, contractions, leaking of fluid and fetal movement were reviewed in detail with the patient. Please refer to After Visit Summary for other counseling recommendations.   Return in about 1 week (around 11/18/2018) for Return OB visit.  Future Appointments  Date Time Provider [place]  11/18/2018  1:10 PM Laury Deep, [place] None    Laury Deep, [place]

## 2018-11-11 NOTE — Discharge Instructions (Signed)

## 2018-11-18 ENCOUNTER — Other Ambulatory Visit: Payer: Self-pay

## 2018-11-18 ENCOUNTER — Ambulatory Visit (INDEPENDENT_AMBULATORY_CARE_PROVIDER_SITE_OTHER): Payer: Medicaid Other | Admitting: Obstetrics and Gynecology

## 2018-11-18 ENCOUNTER — Encounter: Payer: Self-pay | Admitting: Obstetrics & Gynecology

## 2018-11-18 ENCOUNTER — Inpatient Hospital Stay (HOSPITAL_COMMUNITY)
Admission: AD | Admit: 2018-11-18 | Discharge: 2018-11-22 | DRG: 788 | Disposition: A | Discharge status: Home / Self Care | Payer: 59 | Attending: Family Medicine | Admitting: Family Medicine

## 2018-11-18 ENCOUNTER — Other Ambulatory Visit: Payer: Self-pay | Admitting: Obstetrics and Gynecology

## 2018-11-18 ENCOUNTER — Encounter (HOSPITAL_COMMUNITY): Payer: Self-pay | Admitting: *Deleted

## 2018-11-18 VITALS — BP 147/86 | HR 92 | Temp 98.3°F | Wt 312.6 lb

## 2018-11-18 DIAGNOSIS — Z3A39 39 weeks gestation of pregnancy: Secondary | ICD-10-CM

## 2018-11-18 DIAGNOSIS — O99210 Obesity complicating pregnancy, unspecified trimester: Secondary | ICD-10-CM | POA: Insufficient documentation

## 2018-11-18 DIAGNOSIS — O3663X0 Maternal care for excessive fetal growth, third trimester, not applicable or unspecified: Secondary | ICD-10-CM

## 2018-11-18 DIAGNOSIS — Z20828 Contact with and (suspected) exposure to other viral communicable diseases: Secondary | ICD-10-CM | POA: Diagnosis present

## 2018-11-18 DIAGNOSIS — O3660X0 Maternal care for excessive fetal growth, unspecified trimester, not applicable or unspecified: Secondary | ICD-10-CM

## 2018-11-18 DIAGNOSIS — Z30430 Encounter for insertion of intrauterine contraceptive device: Secondary | ICD-10-CM

## 2018-11-18 DIAGNOSIS — O134 Gestational [pregnancy-induced] hypertension without significant proteinuria, complicating childbirth: Principal | ICD-10-CM | POA: Diagnosis present

## 2018-11-18 DIAGNOSIS — Z23 Encounter for immunization: Secondary | ICD-10-CM

## 2018-11-18 DIAGNOSIS — D649 Anemia, unspecified: Secondary | ICD-10-CM | POA: Diagnosis present

## 2018-11-18 DIAGNOSIS — O9902 Anemia complicating childbirth: Secondary | ICD-10-CM | POA: Diagnosis present

## 2018-11-18 DIAGNOSIS — O99013 Anemia complicating pregnancy, third trimester: Secondary | ICD-10-CM

## 2018-11-18 DIAGNOSIS — O99214 Obesity complicating childbirth: Secondary | ICD-10-CM | POA: Diagnosis present

## 2018-11-18 DIAGNOSIS — O163 Unspecified maternal hypertension, third trimester: Secondary | ICD-10-CM

## 2018-11-18 DIAGNOSIS — Z3400 Encounter for supervision of normal first pregnancy, unspecified trimester: Secondary | ICD-10-CM

## 2018-11-18 DIAGNOSIS — R030 Elevated blood-pressure reading, without diagnosis of hypertension: Secondary | ICD-10-CM | POA: Diagnosis not present

## 2018-11-18 DIAGNOSIS — O99019 Anemia complicating pregnancy, unspecified trimester: Secondary | ICD-10-CM | POA: Diagnosis present

## 2018-11-18 DIAGNOSIS — O2603 Excessive weight gain in pregnancy, third trimester: Secondary | ICD-10-CM | POA: Diagnosis present

## 2018-11-18 HISTORY — DX: Morbid (severe) obesity due to excess calories: E66.01

## 2018-11-18 LAB — URINALYSIS, ROUTINE W REFLEX MICROSCOPIC
Bilirubin Urine: NEGATIVE
Glucose, UA: NEGATIVE mg/dL
Hgb urine dipstick: NEGATIVE
Ketones, ur: NEGATIVE mg/dL
Leukocytes,Ua: NEGATIVE
Nitrite: NEGATIVE
Protein, ur: NEGATIVE mg/dL
Specific Gravity, Urine: 1.014 (ref 1.005–1.030)
pH: 6 (ref 5.0–8.0)

## 2018-11-18 LAB — COMPREHENSIVE METABOLIC PANEL
ALT: 14 U/L (ref 0–44)
AST: 18 U/L (ref 15–41)
Albumin: 2.6 g/dL — ABNORMAL LOW (ref 3.5–5.0)
Alkaline Phosphatase: 136 U/L — ABNORMAL HIGH (ref 38–126)
Anion gap: 9 (ref 5–15)
BUN: 6 mg/dL (ref 6–20)
CO2: 20 mmol/L — ABNORMAL LOW (ref 22–32)
Calcium: 8.6 mg/dL — ABNORMAL LOW (ref 8.9–10.3)
Chloride: 108 mmol/L (ref 98–111)
Creatinine, Ser: 0.65 mg/dL (ref 0.44–1.00)
GFR calc Af Amer: >60 mL/min (ref >60)
GFR calc non Af Amer: >60 mL/min (ref >60)
Glucose, Bld: 119 mg/dL — ABNORMAL HIGH (ref 70–99)
Potassium: 3.7 mmol/L (ref 3.5–5.1)
Sodium: 137 mmol/L (ref 135–145)
Total Bilirubin: 0.2 mg/dL — ABNORMAL LOW (ref 0.3–1.2)
Total Protein: 5.9 g/dL — ABNORMAL LOW (ref 6.5–8.1)

## 2018-11-18 LAB — CBC
HCT: 31.1 % — ABNORMAL LOW (ref 36.0–46.0)
Hemoglobin: 9.9 g/dL — ABNORMAL LOW (ref 12.0–15.0)
MCH: 24.1 pg — ABNORMAL LOW (ref 26.0–34.0)
MCHC: 31.8 g/dL (ref 30.0–36.0)
MCV: 75.9 fL — ABNORMAL LOW (ref 80.0–100.0)
Platelets: 296 10*3/uL (ref 150–400)
RBC: 4.1 MIL/uL (ref 3.87–5.11)
RDW: 16.8 % — ABNORMAL HIGH (ref 11.5–15.5)
WBC: 6.9 10*3/uL (ref 4.0–10.5)
nRBC: 0 % (ref 0.0–0.2)

## 2018-11-18 LAB — PROTEIN / CREATININE RATIO, URINE
Creatinine, Urine: 99.81 mg/dL
Creatinine, Urine: 100.44 mg/dL
Total Protein, Urine: <6 mg/dL
Total Protein, Urine: <6 mg/dL

## 2018-11-18 LAB — SARS CORONAVIRUS 2 BY RT PCR (HOSPITAL ORDER, PERFORMED IN ~~LOC~~ HOSPITAL LAB): SARS Coronavirus 2: NEGATIVE

## 2018-11-18 LAB — ABO/RH: ABO/RH(D): O POS

## 2018-11-18 LAB — TYPE AND SCREEN
ABO/RH(D): O POS
Antibody Screen: NEGATIVE

## 2018-11-18 MED ORDER — SODIUM CHLORIDE 0.9 % IV SOLN: 5.0000 10*6.[IU] | Freq: Once | INTRAVENOUS | Status: DC

## 2018-11-18 MED ORDER — PENICILLIN G 3 MILLION UNITS IVPB - SIMPLE MED: 3.0000 10*6.[IU] | INTRAVENOUS | Status: DC

## 2018-11-18 NOTE — MAU Note (Signed)
Sent from office for elevated b/p. deneis any headache or visual problems. Good fetal movement reported.

## 2018-11-18 NOTE — MAU Provider Note (Addendum)
Patient Krystal Hudson is a 23 y.o.  G1P0 At [redacted]w[redacted]d here for NST and lab work after having two elevated pressures at her prenatal visit today at [place]. She denies decreased fetal movements, LOF, VB, blurry vision or other ob-gyn complaints.  She has had a pregnancy complicated by suspected LGA; AC in the 99th percentile.   History     CSN: [id number]  Arrival date and time: 11/18/18 1751   None     Chief Complaint  Patient presents with  . Hypertension   Hypertension This is a new problem. The current episode started today. The problem has been resolved since onset. Pertinent negatives include no blurred vision, headaches or shortness of breath.    OB History    Gravida  1   Para      Term      Preterm      AB      Living        SAB      TAB      Ectopic      Multiple      Live Births              Past Medical History:  Diagnosis Date  . Medical history non-contributory     Past Surgical History:  Procedure Laterality Date  . NO PAST SURGERIES      No family history on file.  Social History   Tobacco Use  . Smoking status: Never Smoker  . Smokeless tobacco: Never Used  Substance Use Topics  . Alcohol use: Never    Frequency: Never  . Drug use: Never    Allergies: No Known Allergies  Medications Prior to Admission  Medication Sig Dispense Refill Last Dose  . ferrous sulfate 325 (65 FE) MG tablet Take 1 tablet (325 mg total) by mouth daily. 30 tablet 1 11/18/2018 at Unknown time  . Prenatal Vit-Fe Fumarate-FA (MULTIVITAMIN-PRENATAL) 27-0.8 MG TABS tablet Take 1 tablet by mouth daily at 12 noon.   11/18/2018 at Unknown time  . cyclobenzaprine (FLEXERIL) 10 MG tablet Take 1 tablet (10 mg total) by mouth 2 (two) times daily as needed for muscle spasms (Migraine Headaches). (Patient not taking: Reported on 08/27/2018) 60 tablet 0     Review of Systems  Constitutional: Negative.   HENT: Negative.   Eyes: Negative for blurred vision.   Respiratory: Negative.  Negative for shortness of breath.   Cardiovascular: Negative.   Gastrointestinal: Negative.   Genitourinary: Negative.   Musculoskeletal: Negative.   Neurological: Negative for headaches.   Physical Exam   Blood pressure 132/87, pulse (!) 102, temperature 99.1 F (37.3 C), resp. rate 18, height 5\' 4"  (1.626 m), weight (!) 142.9 kg, last menstrual period 02/14/2018.  Physical Exam  Constitutional: She appears well-developed.  HENT:  Head: Normocephalic.  Neck: Normal range of motion.  Cardiovascular: Normal rate.  Respiratory: Effort normal.  GI: Soft.  Musculoskeletal: Normal range of motion.  Neurological: She is alert.    MAU Course  Procedures  MDM -NST: 130 bpm, mod var, present acel, neg decels, no contractions.  -pre-e labs (CBC, CMP, PCR) pending -Patient is asymptomatic for symptoms of preeclampsia.  Assessment and Plan   -Patient had three BP readings that were elevated; meets criteria for GHTN. Discussed with Dr. Harolyn Rutherford, who agrees with plan for admission.  -Labor team notified; patient will be transported to L and D as soon as bed available.    Mervyn Skeeters Kooistra 11/18/2018, 7:52 PM

## 2018-11-18 NOTE — Progress Notes (Signed)
   LOW-RISK PREGNANCY OFFICE VISIT Patient name: Krystal Hudson MRN [id number]  Date of birth: [birth year] Chief Complaint:   Routine Prenatal Visit  History of Present Illness:   Krystal Hudson is a 23 y.o. G1P0 female at [redacted]w[redacted]d with an Estimated Date of Delivery: 11/21/18 being seen today for ongoing management of a low-risk pregnancy.  Today she reports fatigue and occasional contractions. Contractions: Irregular. Vag. Bleeding: Scant.  Movement: Present. denies leaking of fluid. Review of Systems:   Pertinent items are noted in HPI Denies abnormal vaginal discharge w/ itching/odor/irritation, headaches, visual changes, shortness of breath, chest pain, abdominal pain, severe nausea/vomiting, or problems with urination or bowel movements unless otherwise stated above. Pertinent History Reviewed:  Reviewed past medical,surgical, social, obstetrical and family history.  Reviewed problem list, medications and allergies. Physical Assessment:   Vitals:   11/18/18 1341 11/18/18 1423  BP: 140/88 (!) 147/86  Pulse: 100 92  Temp: 98.3 F (36.8 C)   Weight: (!) 312 lb 9.6 oz (141.8 kg)   Body mass index is 53.66 kg/m.        Physical Examination:   General appearance: Well appearing, and in no distress  Mental status: Alert, oriented to person, place, and time  Skin: Warm & dry  Cardiovascular: Normal heart rate noted  Respiratory: Normal respiratory effort, no distress  Abdomen: Soft, gravid, nontender  Pelvic: Cervical exam performed  Dilation: Fingertip Effacement (%): Thick Station: Ballotable  Extremities: Edema: None  Fetal Status: Fetal Heart Rate (bpm): 151 Fundal Height: 45 cm Movement: Present Presentation: Vertex  No results found for this or any previous visit (from the past 24 hour(s)).  Assessment & Plan:  1) Low-risk pregnancy G1P0 at [redacted]w[redacted]d with an Estimated Date of Delivery: 11/21/18   2) Excessive fetal growth affecting management of pregnancy, antepartum, single or  unspecified fetus - Advised that per ACOG macrosomia is not a valid reason for IOL  3) Elevated blood pressure affecting pregnancy in third trimester, antepartum - Consult with Dr. Harolyn Rutherford @ 1500 - notified of patient's trending BPs-- recommended tx plan NST, PEC labs and serial BPs today  will have to be in MAU d/t patient no longer in office - Patient and Maye Hides, CNM notified of Dr. Arther Abbott plan for her to have NST, labs ans serial BPs in MAU today - Patient agrees to go to MAU after eating something and getting her support person to go with her to MAU  4) Supervision of normal first pregnancy, antepartum - IOL scheduled for 41 wks  orders entered in EPIC   Labs/procedures today: cervical exam  Plan:  Continue routine obstetrical care   Reviewed: Term labor symptoms and general obstetric precautions including but not limited to vaginal bleeding, contractions, leaking of fluid and fetal movement were reviewed in detail with the patient.  All questions were answered. Owns home bp cuff. Check bp weekly, let us know if >140/90.   Follow-up: No follow-ups on file. To MAU today for PEC w/u and NST  Laury Deep MSN, CNM 11/18/2018 3:18 PM

## 2018-11-18 NOTE — Progress Notes (Signed)
IOL orders entered  Derk Doubek, CNM  

## 2018-11-18 NOTE — H&P (Signed)
Krystal Hudson is a 23 y.o. female G1P0 with IUP at [redacted]w[redacted]d presenting for evaluation after having elevated BPs x 2 in clinic today. Pt states she has been having no contractions, associated with none vaginal bleeding for no hours..  Membranes are intact, with active fetal movement.   [facility] at [facility] [facility] since 13 wks  Prenatal History/Complications:  Past Medical History: Past Medical History:  Diagnosis Date  . Medical history non-contributory     Past Surgical History: Past Surgical History:  Procedure Laterality Date  . NO PAST SURGERIES      Obstetrical History: OB History    Gravida  1   Para      Term      Preterm      AB      Living        SAB      TAB      Ectopic      Multiple      Live Births               Social History: Social History   Socioeconomic History  . Marital status: Single    Spouse name: Not on file  . Number of children: Not on file  . Years of education: 12th  . Highest education level: 12th grade  Occupational History  . Not on file  Social Needs  . Financial resource strain: Somewhat hard  . Food insecurity    Worry: Never true    Inability: Never true  . Transportation needs    Medical: Yes    Non-medical: Yes  Tobacco Use  . Smoking status: Never Smoker  . Smokeless tobacco: Never Used  Substance and Sexual Activity  . Alcohol use: Never    Frequency: Never  . Drug use: Never  . Sexual activity: Not Currently    Birth control/protection: None  Lifestyle  . Physical activity    Days per week: 0 days    Minutes per session: 0 min  . Stress: Only a little  Relationships  . Social Herbalist on phone: Once a week    Gets together: Once a week    Attends religious service: Patient refused    Active member of club or organization: No    Attends meetings of clubs or organizations: Never    Relationship status: Living with partner  Other Topics Concern  . Not on file  Social History Narrative   . Not on file    Family History: No family history on file.  Allergies: No Known Allergies  Medications Prior to Admission  Medication Sig Dispense Refill Last Dose  . ferrous sulfate 325 (65 FE) MG tablet Take 1 tablet (325 mg total) by mouth daily. 30 tablet 1 11/18/2018 at Unknown time  . Prenatal Vit-Fe Fumarate-FA (MULTIVITAMIN-PRENATAL) 27-0.8 MG TABS tablet Take 1 tablet by mouth daily at 12 noon.   11/18/2018 at Unknown time  . cyclobenzaprine (FLEXERIL) 10 MG tablet Take 1 tablet (10 mg total) by mouth 2 (two) times daily as needed for muscle spasms (Migraine Headaches). (Patient not taking: Reported on 08/27/2018) 60 tablet 0         Review of Systems   Constitutional: Negative for fever and chills Eyes: Negative for visual disturbances Respiratory: Negative for shortness of breath, dyspnea Cardiovascular: Negative for chest pain or palpitations  Gastrointestinal: Negative for vomiting, diarrhea and constipation.  POSITIVE for abdominal pain (contractions) Genitourinary: Negative for dysuria and urgency Musculoskeletal: Negative for back pain,  joint pain, myalgias  Neurological: Negative for dizziness and headaches      Blood pressure 132/87, pulse (!) 102, temperature 99.1 F (37.3 C), resp. rate 18, height 5\' 4"  (1.626 m), weight (!) 142.9 kg, last menstrual period 02/14/2018. General appearance: alert, cooperative and no distress Lungs: normal respiratory effort Heart: regular rate and rhythm Abdomen: soft, non-tender; bowel sounds normal Extremities: Homans sign is negative, no sign of DVT DTR's 2+ Presentation: cephalic Fetal monitoring  Baseline: 140 bpm, mod var, present acel, no decels Uterine activity  None     Prenatal labs: ABO, Rh: O/Positive/-- (03/16 1458) Antibody: Negative (03/16 1458) Rubella: 2.22 (03/16 1458) RPR: Non Reactive (07/08 0853)  HBsAg: Negative (03/16 1458)  HIV: Non Reactive (07/08 0853)  GBS: Positive/-- (09/10 1338)   1 hr Glucola 74 128 88 Genetic screening  NIPS Anatomy US normal anatomy but AC in 99%  Prenatal Transfer Tool  Maternal Diabetes: No Genetic Screening: Normal Maternal Ultrasounds/Referrals: Normal Fetal Ultrasounds or other Referrals:  Referred to Materal Fetal Medicine  for growth scan Maternal Substance Abuse:  No Significant Maternal Medications:  None Significant Maternal Lab Results: None     Results for orders placed or performed during the hospital encounter of 11/18/18 (from the past 24 hour(s))  Urinalysis, Routine w reflex microscopic   Collection Time: 11/18/18  6:24 PM  Result Value Ref Range   Color, Urine YELLOW YELLOW   APPearance CLEAR CLEAR   Specific Gravity, Urine 1.014 1.005 - 1.030   pH 6.0 5.0 - 8.0   Glucose, UA NEGATIVE NEGATIVE mg/dL   Hgb urine dipstick NEGATIVE NEGATIVE   Bilirubin Urine NEGATIVE NEGATIVE   Ketones, ur NEGATIVE NEGATIVE mg/dL   Protein, ur NEGATIVE NEGATIVE mg/dL   Nitrite NEGATIVE NEGATIVE   Leukocytes,Ua NEGATIVE NEGATIVE  Protein / creatinine ratio, urine   Collection Time: 11/18/18  6:24 PM  Result Value Ref Range   Creatinine, Urine 99.81 mg/dL   Total Protein, Urine <6 mg/dL   Protein Creatinine Ratio        0.00 - 0.15 mg/mg[Cre]  CBC   Collection Time: 11/18/18  7:12 PM  Result Value Ref Range   WBC 6.9 4.0 - 10.5 K/uL   RBC 4.10 3.87 - 5.11 MIL/uL   Hemoglobin 9.9 (L) 12.0 - 15.0 g/dL   HCT 02.731.1 (L) 25.336.0 - 66.446.0 %   MCV 75.9 (L) 80.0 - 100.0 fL   MCH 24.1 (L) 26.0 - 34.0 pg   MCHC 31.8 30.0 - 36.0 g/dL   RDW 40.316.8 (H) 47.411.5 - 25.915.5 %   Platelets 296 150 - 400 K/uL   nRBC 0.0 0.0 - 0.2 %  Comprehensive metabolic panel   Collection Time: 11/18/18  7:12 PM  Result Value Ref Range   Sodium 137 135 - 145 mmol/L   Potassium 3.7 3.5 - 5.1 mmol/L   Chloride 108 98 - 111 mmol/L   CO2 20 (L) 22 - 32 mmol/L   Glucose, Bld 119 (H) 70 - 99 mg/dL   BUN 6 6 - 20 mg/dL   Creatinine, Ser 5.630.65 0.44 - 1.00 mg/dL   Calcium  8.6 (L) 8.9 - 10.3 mg/dL   Total Protein 5.9 (L) 6.5 - 8.1 g/dL   Albumin 2.6 (L) 3.5 - 5.0 g/dL   AST 18 15 - 41 U/L   ALT 14 0 - 44 U/L   Alkaline Phosphatase 136 (H) 38 - 126 U/L   Total Bilirubin 0.2 (L) 0.3 - 1.2 mg/dL  GFR calc non Af Amer >60 >60 mL/min   GFR calc Af Amer >60 >60 mL/min   Anion gap 9 5 - 15    Assessment: Krystal Hudson is a 23 y.o. G1P0 with an IUP at [redacted]w[redacted]d presenting for IOL for GHTN, AC in 99%; LGA 3332 grams.  Plan: #Labor: expectant management #Pain:  Per request #FWB Cat 1 #ID: GBS: positive, will start PCN #MOF:  breast #MOC: undecided #Circ: undecided  -monitor BP closely -Pre-E labs are normal   Charlesetta Garibaldi  11/18/2018, 7:56 PM

## 2018-11-19 ENCOUNTER — Encounter (HOSPITAL_COMMUNITY)
Admission: AD | Disposition: A | Discharge status: Home / Self Care | Payer: Self-pay | Source: Home / Self Care | Attending: Family Medicine

## 2018-11-19 DIAGNOSIS — R030 Elevated blood-pressure reading, without diagnosis of hypertension: Secondary | ICD-10-CM | POA: Diagnosis present

## 2018-11-19 DIAGNOSIS — O9902 Anemia complicating childbirth: Secondary | ICD-10-CM | POA: Diagnosis present

## 2018-11-19 DIAGNOSIS — Z3A39 39 weeks gestation of pregnancy: Secondary | ICD-10-CM | POA: Diagnosis not present

## 2018-11-19 DIAGNOSIS — Z30430 Encounter for insertion of intrauterine contraceptive device: Secondary | ICD-10-CM | POA: Diagnosis not present

## 2018-11-19 DIAGNOSIS — O134 Gestational [pregnancy-induced] hypertension without significant proteinuria, complicating childbirth: Secondary | ICD-10-CM | POA: Diagnosis present

## 2018-11-19 DIAGNOSIS — O3663X0 Maternal care for excessive fetal growth, third trimester, not applicable or unspecified: Secondary | ICD-10-CM | POA: Diagnosis present

## 2018-11-19 DIAGNOSIS — O99824 Streptococcus B carrier state complicating childbirth: Secondary | ICD-10-CM | POA: Diagnosis not present

## 2018-11-19 DIAGNOSIS — O1002 Pre-existing essential hypertension complicating childbirth: Secondary | ICD-10-CM | POA: Diagnosis not present

## 2018-11-19 DIAGNOSIS — D649 Anemia, unspecified: Secondary | ICD-10-CM | POA: Diagnosis present

## 2018-11-19 DIAGNOSIS — O99214 Obesity complicating childbirth: Secondary | ICD-10-CM | POA: Diagnosis not present

## 2018-11-19 DIAGNOSIS — Z20828 Contact with and (suspected) exposure to other viral communicable diseases: Secondary | ICD-10-CM | POA: Diagnosis present

## 2018-11-19 DIAGNOSIS — Z23 Encounter for immunization: Secondary | ICD-10-CM | POA: Diagnosis not present

## 2018-11-19 DIAGNOSIS — O2603 Excessive weight gain in pregnancy, third trimester: Secondary | ICD-10-CM | POA: Diagnosis present

## 2018-11-19 LAB — RPR: RPR Ser Ql: NONREACTIVE

## 2018-11-19 SURGERY — Surgical Case
Anesthesia: Regional

## 2018-11-19 MED ORDER — ACETAMINOPHEN 325 MG PO TABS: 650.0000 mg | ORAL_TABLET | ORAL | Status: DC | PRN

## 2018-11-19 MED ORDER — OXYTOCIN BOLUS FROM INFUSION: 500.0000 mL | Freq: Once | INTRAVENOUS | Status: DC

## 2018-11-19 MED ORDER — ONDANSETRON HCL 4 MG/2ML IJ SOLN: 4.0000 mg | Freq: Four times a day (QID) | INTRAMUSCULAR | Status: DC | PRN

## 2018-11-19 MED ORDER — TERBUTALINE SULFATE 1 MG/ML IJ SOLN: 0.2500 mg | Freq: Once | INTRAMUSCULAR | Status: DC | PRN

## 2018-11-19 MED ORDER — LACTATED RINGERS IV SOLN
INTRAVENOUS | Status: DC
  Administered 2018-11-19 (×3): via INTRAVENOUS

## 2018-11-19 MED ORDER — SODIUM CHLORIDE 0.9 % IV SOLN
5.0000 10*6.[IU] | Freq: Once | INTRAVENOUS | Status: AC
  Administered 2018-11-19: 01:00:00 5 10*6.[IU] via INTRAVENOUS
  Filled 2018-11-19: qty 5

## 2018-11-19 MED ORDER — OXYTOCIN 40 UNITS IN NORMAL SALINE INFUSION - SIMPLE MED
1.0000 m[IU]/min | INTRAVENOUS | Status: DC
  Administered 2018-11-19: 19:00:00 2 m[IU]/min via INTRAVENOUS

## 2018-11-19 MED ORDER — FENTANYL CITRATE (PF) 100 MCG/2ML IJ SOLN
100.0000 ug | INTRAMUSCULAR | Status: DC | PRN
  Administered 2018-11-19 (×2): 100 ug via INTRAVENOUS
  Filled 2018-11-19 (×2): qty 2

## 2018-11-19 MED ORDER — MISOPROSTOL 50MCG HALF TABLET
50.0000 ug | ORAL_TABLET | ORAL | Status: DC | PRN
  Administered 2018-11-19 (×3): 50 ug via BUCCAL
  Filled 2018-11-19 (×3): qty 1

## 2018-11-19 MED ORDER — LIDOCAINE HCL (PF) 1 % IJ SOLN: 30.0000 mL | INTRAMUSCULAR | Status: DC | PRN

## 2018-11-19 MED ORDER — OXYTOCIN 40 UNITS IN NORMAL SALINE INFUSION - SIMPLE MED
2.5000 [IU]/h | INTRAVENOUS | Status: DC
  Filled 2018-11-19: qty 1000

## 2018-11-19 MED ORDER — PENICILLIN G 3 MILLION UNITS IVPB - SIMPLE MED
3.0000 10*6.[IU] | INTRAVENOUS | Status: DC
  Administered 2018-11-19 (×5): 3 10*6.[IU] via INTRAVENOUS
  Filled 2018-11-19 (×5): qty 100

## 2018-11-19 MED ORDER — HYDROXYZINE HCL 50 MG PO TABS: 50.0000 mg | ORAL_TABLET | Freq: Four times a day (QID) | ORAL | Status: DC | PRN

## 2018-11-19 MED ORDER — LACTATED RINGERS IV SOLN: 500.0000 mL | INTRAVENOUS | Status: DC | PRN

## 2018-11-19 NOTE — Progress Notes (Signed)
   Krystal Hudson is a 23 y.o. G1P0 at [redacted]w[redacted]d  admitted for [facility].  Subjective: Doing well; eating bed. She reports feeling some pressure but is very comfortable.   Objective: Vitals:   11/18/18 2358 11/19/18 0004 11/19/18 0445 11/19/18 0826  BP: 124/68  (!) 126/57 119/70  Pulse: 83  79 75  Resp:  16 16 18   Temp:  98.5 F (36.9 C) 97.9 F (36.6 C) 98.3 F (36.8 C)  TempSrc:  Axillary Oral Oral  Weight:  (!) 141.1 kg    Height:  5\' 4"  (1.626 m)     No intake/output data recorded.  FHT:  FHR: 140 bpm, variability: moderate,  accelerations:  Present,  decelerations:  Absent UC:   irregular, every q-2 minutes SVE:   Dilation: Fingertip Effacement (%): Thick Station: -3 Exam by:: L.Mears,RN Pitocin @ NA  Labs: Lab Results  Component Value Date   WBC 6.9 11/18/2018   HGB 9.9 (L) 11/18/2018   HCT 31.1 (L) 11/18/2018   MCV 75.9 (L) 11/18/2018   PLT 296 11/18/2018    Assessment / Plan: early labor, has only had two doses of cytotec. Normal labs last night and normal blood pressure. No s/s of pre-e.  -will give next dose of cytotec at 10 and try FB.  Labor: early labor Fetal Wellbeing:  Category I Pain Control:  Labor support without medications Anticipated MOD:  NSVD  Mervyn Skeeters Kooistra 11/19/2018, 9:22 AM

## 2018-11-19 NOTE — Progress Notes (Signed)
LABOR PROGRESS NOTE  Krystal Hudson is a 23 y.o. G1P0 at [redacted]w[redacted]d  admitted for IOL for gHTN.  Subjective: Feeling contractions but overall doing well with the pain  Objective: BP 128/69   Pulse 90   Temp 98.5 F (36.9 C) (Axillary)   Resp 18   Ht 5\' 4"  (1.626 m)   Wt (!) 141.1 kg   LMP 02/14/2018 (Exact Date)   BMI 53.38 kg/m  or  Vitals:   11/19/18 2101 11/19/18 2132 11/19/18 2202 11/19/18 2231  BP: 109/77 130/70 138/76 128/69  Pulse: (!) 111 73 74 90  Resp: 18 18    Temp:      TempSrc:      Weight:      Height:         Dilation: 5.5 Effacement (%): 90 Cervical Position: Middle Station: -1 Presentation: Vertex Exam by:: Lenise Herald RN FHT: baseline rate 140, moderate varibility, +acel, early decel Toco: q1-3 min  Labs: Lab Results  Component Value Date   WBC 6.9 11/18/2018   HGB 9.9 (L) 11/18/2018   HCT 31.1 (L) 11/18/2018   MCV 75.9 (L) 11/18/2018   PLT 296 11/18/2018    Patient Active Problem List   Diagnosis Date Noted  . Indication for care in labor or delivery 11/19/2018  . Maternal morbid obesity, antepartum ([place]) 11/18/2018  . LGA (large for gestational age) fetus affecting management of mother 11/03/2018  . Elevated blood pressure affecting pregnancy in third trimester, antepartum 10/28/2018  . Anemia in pregnancy 10/28/2018  . Supervision of normal first pregnancy, antepartum 05/03/2018    Assessment / Plan: 23 y.o. G1P0 at [redacted]w[redacted]d here for IOL for gHTN.  Labor: progressing well s/p miso x3, FB, AROM, now on pit since 1845 and making progress. Currently at 8 with adequate MVU's.   Fetal Wellbeing:  Cat I Pain Control:  Declines epidural, IV pain medicine PRN Anticipated MOD:  SVD gHTN: one mild range pressure since arrival, otherwise normotensive, PIH labs normal, ctm.  Fetal macrosomia: extrapolated EFW ~4500g, unable to accurately assess w leopolds. Shoulder counseling done.   Augustin Coupe, MD/[facility] OB Fellow  11/19/2018, 10:34 PM

## 2018-11-19 NOTE — Progress Notes (Signed)
   Krystal Hudson is a 23 y.o. G1P0 at [redacted]w[redacted]d  admitted for [facility].   Subjective: Doing well; resting in bed.   Objective: Vitals:   11/19/18 1311 11/19/18 1514 11/19/18 1643 11/19/18 1644  BP: 122/78 (!) 125/56  123/77  Pulse: 87 77  65  Resp: 18 18 18    Temp:   98.6 F (37 C)   TempSrc:   Oral   Weight:      Height:       No intake/output data recorded.  FHT:  FHR: 140 bpm, variability: moderate,  accelerations:  Present,  decelerations:  Absent UC:   irregular, every 2-3 minutes SVE:   Dilation: 4.5 Effacement (%): 60, 70 Station: -2 Exam by:: E. Rothermel RN    Labs: Lab Results  Component Value Date   WBC 6.9 11/18/2018   HGB 9.9 (L) 11/18/2018   HCT 31.1 (L) 11/18/2018   MCV 75.9 (L) 11/18/2018   PLT 296 11/18/2018    Assessment / Plan: early labor; FB came out this afternoon, patient is still 4-5; 60-70 -AROM clear fluid -IUPC placed -will start pitocin  Labor: still in early labor Fetal Wellbeing:  Category I Pain Control:  Labor support without medications Anticipated MOD:  NSVD  Starr Lake 11/19/2018, 6:41 PM

## 2018-11-19 NOTE — Progress Notes (Signed)
Patient Vitals for the past 4 hrs:  BP Temp Temp src Pulse Resp Height Weight  11/19/18 0004 - 98.5 F (36.9 C) Axillary - 16 5\' 4"  (1.626 m) (!) 141.1 kg  11/18/18 2338 (!) 126/59 - - 90 - - -  11/18/18 2146 134/72 - - 95 - - -   Pt just arrived to floor for IOL for GHTN.  LAbs nl. Feels fine. FHR Cat 1.  Cx 1/thick/-3 per previous exam. First dose of buccal cytotec 70mcg given.

## 2018-11-19 NOTE — Progress Notes (Signed)
Patient Vitals for the past 4 hrs:  BP Temp Temp src Pulse Resp  11/19/18 0445 (!) 126/57 97.9 F (36.6 C) Oral 79 16   Cx FT/long/-3 per RN exam after 1st cytotec.  2nd one placed around 0445.  FHR Cat 1.  Occ mild ctx.  Continue present mgt.  IOL GHTN

## 2018-11-19 NOTE — Progress Notes (Signed)
Requested bedside ultrasound d/t external fetal tracing high on mother's abdomin. K Kooistra performed at bedside and confirmed vertex presentation.  [place] Seila Liston RN

## 2018-11-19 NOTE — Progress Notes (Addendum)
   Krystal Hudson is a 23 y.o. G1P0 at [redacted]w[redacted]d  admitted for [facility].   Subjective: Doing well, watching TV.   Objective: Vitals:   11/19/18 0445 11/19/18 0826 11/19/18 1034 11/19/18 1035  BP: (!) 126/57 119/70 121/67 121/67  Pulse: 79 75 79 79  Resp: 16 18  18   Temp: 97.9 F (36.6 C) 98.3 F (36.8 C)    TempSrc: Oral Oral    Weight:      Height:       No intake/output data recorded.  FHT:  FHR: 130 bpm, variability: moderate,  accelerations:  Present,  decelerations:  Absent UC:   irregular, every 3 minutes SVE:   Dilation: Fingertip Effacement (%): Thick Station: -3 Exam by:: L.Mears,RN  Labs: Lab Results  Component Value Date   WBC 6.9 11/18/2018   HGB 9.9 (L) 11/18/2018   HCT 31.1 (L) 11/18/2018   MCV 75.9 (L) 11/18/2018   PLT 296 11/18/2018    Assessment / Plan: IOL for GHTN, FB placed now. will continue oral cytotec.   Labor: early labor; now with FB Fetal Wellbeing:  Category I Pain Control:  Labor support without medications Anticipated MOD:  NSVD  Mervyn Skeeters Kooistra 11/19/2018, 11:06 AM

## 2018-11-20 ENCOUNTER — Inpatient Hospital Stay (HOSPITAL_COMMUNITY): Payer: 59 | Admitting: Anesthesiology

## 2018-11-20 ENCOUNTER — Encounter (HOSPITAL_COMMUNITY)
Admission: AD | Disposition: A | Discharge status: Home / Self Care | Payer: Self-pay | Source: Home / Self Care | Attending: Family Medicine

## 2018-11-20 DIAGNOSIS — O1002 Pre-existing essential hypertension complicating childbirth: Secondary | ICD-10-CM

## 2018-11-20 DIAGNOSIS — Z30430 Encounter for insertion of intrauterine contraceptive device: Secondary | ICD-10-CM

## 2018-11-20 DIAGNOSIS — Z3A39 39 weeks gestation of pregnancy: Secondary | ICD-10-CM

## 2018-11-20 DIAGNOSIS — O99214 Obesity complicating childbirth: Secondary | ICD-10-CM

## 2018-11-20 DIAGNOSIS — O99824 Streptococcus B carrier state complicating childbirth: Secondary | ICD-10-CM

## 2018-11-20 LAB — CBC
HCT: 25.8 % — ABNORMAL LOW (ref 36.0–46.0)
Hemoglobin: 8 g/dL — ABNORMAL LOW (ref 12.0–15.0)
MCH: 23.7 pg — ABNORMAL LOW (ref 26.0–34.0)
MCHC: 31 g/dL (ref 30.0–36.0)
MCV: 76.3 fL — ABNORMAL LOW (ref 80.0–100.0)
Platelets: 255 10*3/uL (ref 150–400)
RBC: 3.38 MIL/uL — ABNORMAL LOW (ref 3.87–5.11)
RDW: 16.9 % — ABNORMAL HIGH (ref 11.5–15.5)
WBC: 9.6 10*3/uL (ref 4.0–10.5)
nRBC: 0 % (ref 0.0–0.2)

## 2018-11-20 SURGERY — Surgical Case
Anesthesia: Spinal

## 2018-11-20 MED ORDER — SOD CITRATE-CITRIC ACID 500-334 MG/5ML PO SOLN
ORAL | Status: AC
  Administered 2018-11-20: 30 mL
  Filled 2018-11-20: qty 15

## 2018-11-20 MED ORDER — NALBUPHINE HCL 10 MG/ML IJ SOLN
5.0000 mg | INTRAMUSCULAR | Status: DC | PRN
  Filled 2018-11-20: qty 0.5

## 2018-11-20 MED ORDER — KETOROLAC TROMETHAMINE 30 MG/ML IJ SOLN: 30.0000 mg | Freq: Four times a day (QID) | INTRAMUSCULAR | Status: AC | PRN

## 2018-11-20 MED ORDER — ZOLPIDEM TARTRATE 5 MG PO TABS: 5.0000 mg | ORAL_TABLET | Freq: Every evening | ORAL | Status: DC | PRN

## 2018-11-20 MED ORDER — SODIUM CHLORIDE 0.9 % IV SOLN
INTRAVENOUS | Status: AC
  Filled 2018-11-20: qty 500

## 2018-11-20 MED ORDER — OXYTOCIN 40 UNITS IN NORMAL SALINE INFUSION - SIMPLE MED
INTRAVENOUS | Status: AC
  Filled 2018-11-20: qty 1000

## 2018-11-20 MED ORDER — SODIUM CHLORIDE 0.9 % IV SOLN
INTRAVENOUS | Status: DC | PRN
  Administered 2018-11-20: 02:00:00 60 ug/min via INTRAVENOUS

## 2018-11-20 MED ORDER — LACTATED RINGERS IV SOLN
INTRAVENOUS | Status: DC
  Administered 2018-11-20: 06:00:00 via INTRAVENOUS

## 2018-11-20 MED ORDER — MORPHINE SULFATE (PF) 0.5 MG/ML IJ SOLN: INTRAMUSCULAR | Status: DC | PRN

## 2018-11-20 MED ORDER — NALOXONE HCL 4 MG/10ML IJ SOLN
1.0000 ug/kg/h | INTRAVENOUS | Status: DC | PRN
  Filled 2018-11-20: qty 5

## 2018-11-20 MED ORDER — NALBUPHINE HCL 10 MG/ML IJ SOLN
5.0000 mg | Freq: Once | INTRAMUSCULAR | Status: DC | PRN
  Filled 2018-11-20: qty 0.5

## 2018-11-20 MED ORDER — IBUPROFEN 800 MG PO TABS
800.0000 mg | ORAL_TABLET | Freq: Three times a day (TID) | ORAL | Status: DC
  Administered 2018-11-20 – 2018-11-22 (×6): 800 mg via ORAL
  Filled 2018-11-20 (×6): qty 1

## 2018-11-20 MED ORDER — SODIUM CHLORIDE 0.9 % IV SOLN
INTRAVENOUS | Status: DC | PRN
  Administered 2018-11-20: 500 mg via INTRAVENOUS

## 2018-11-20 MED ORDER — BUPIVACAINE HCL (PF) 0.5 % IJ SOLN
INTRAMUSCULAR | Status: AC
  Filled 2018-11-20: qty 30

## 2018-11-20 MED ORDER — SODIUM CHLORIDE 0.9 % IV SOLN
INTRAVENOUS | Status: DC | PRN
  Administered 2018-11-20: 02:00:00 via INTRAVENOUS

## 2018-11-20 MED ORDER — LACTATED RINGERS IV SOLN
INTRAVENOUS | Status: DC | PRN
  Administered 2018-11-20: 02:00:00 via INTRAVENOUS

## 2018-11-20 MED ORDER — SCOPOLAMINE 1 MG/3DAYS TD PT72: 1.0000 | MEDICATED_PATCH | Freq: Once | TRANSDERMAL | Status: DC

## 2018-11-20 MED ORDER — EPHEDRINE SULFATE 50 MG/ML IJ SOLN
INTRAMUSCULAR | Status: DC | PRN
  Administered 2018-11-20 (×3): 10 mg via INTRAVENOUS

## 2018-11-20 MED ORDER — PRENATAL MULTIVITAMIN CH
1.0000 | ORAL_TABLET | Freq: Every day | ORAL | Status: DC
  Administered 2018-11-20 – 2018-11-21 (×2): 1 via ORAL
  Filled 2018-11-20 (×2): qty 1

## 2018-11-20 MED ORDER — INFLUENZA VAC SPLIT QUAD 0.5 ML IM SUSY
0.5000 mL | PREFILLED_SYRINGE | INTRAMUSCULAR | Status: AC
  Administered 2018-11-21: 0.5 mL via INTRAMUSCULAR
  Filled 2018-11-20: qty 0.5

## 2018-11-20 MED ORDER — LEVONORGESTREL 19.5 MCG/DAY IU IUD
INTRAUTERINE_SYSTEM | INTRAUTERINE | Status: AC
  Administered 2018-11-20: 1 via INTRAUTERINE
  Filled 2018-11-20: qty 1

## 2018-11-20 MED ORDER — ONDANSETRON HCL 4 MG/2ML IJ SOLN
INTRAMUSCULAR | Status: AC
  Filled 2018-11-20: qty 2

## 2018-11-20 MED ORDER — SIMETHICONE 80 MG PO CHEW
80.0000 mg | CHEWABLE_TABLET | Freq: Three times a day (TID) | ORAL | Status: DC
  Administered 2018-11-20 – 2018-11-21 (×6): 80 mg via ORAL
  Filled 2018-11-20 (×6): qty 1

## 2018-11-20 MED ORDER — LEVONORGESTREL 19.5 MCG/DAY IU IUD
INTRAUTERINE_SYSTEM | Freq: Once | INTRAUTERINE | Status: AC
  Administered 2018-11-20: 03:00:00 1 via INTRAUTERINE

## 2018-11-20 MED ORDER — BUPIVACAINE HCL (PF) 0.5 % IJ SOLN
INTRAMUSCULAR | Status: DC | PRN
  Administered 2018-11-20: 30 mL

## 2018-11-20 MED ORDER — KETOROLAC TROMETHAMINE 30 MG/ML IJ SOLN
30.0000 mg | Freq: Four times a day (QID) | INTRAMUSCULAR | Status: AC | PRN
  Administered 2018-11-20 (×2): 30 mg via INTRAVENOUS
  Filled 2018-11-20 (×2): qty 1

## 2018-11-20 MED ORDER — PHENYLEPHRINE HCL-NACL 20-0.9 MG/250ML-% IV SOLN
INTRAVENOUS | Status: AC
  Filled 2018-11-20: qty 250

## 2018-11-20 MED ORDER — SENNOSIDES-DOCUSATE SODIUM 8.6-50 MG PO TABS
2.0000 | ORAL_TABLET | ORAL | Status: DC
  Administered 2018-11-21 – 2018-11-22 (×2): 2 via ORAL
  Filled 2018-11-20 (×2): qty 2

## 2018-11-20 MED ORDER — DEXTROSE 5 % IV SOLN
3.0000 g | INTRAVENOUS | Status: AC
  Administered 2018-11-20: 02:00:00 3 g via INTRAVENOUS
  Filled 2018-11-20: qty 3000

## 2018-11-20 MED ORDER — DIBUCAINE (PERIANAL) 1 % EX OINT: 1.0000 "application " | TOPICAL_OINTMENT | CUTANEOUS | Status: DC | PRN

## 2018-11-20 MED ORDER — PHENYLEPHRINE HCL (PRESSORS) 10 MG/ML IV SOLN
INTRAVENOUS | Status: DC | PRN
  Administered 2018-11-20 (×4): 80 ug via INTRAVENOUS

## 2018-11-20 MED ORDER — ONDANSETRON HCL 4 MG/2ML IJ SOLN
INTRAMUSCULAR | Status: DC | PRN
  Administered 2018-11-20: 4 mg via INTRAVENOUS

## 2018-11-20 MED ORDER — FENTANYL CITRATE (PF) 100 MCG/2ML IJ SOLN
INTRAMUSCULAR | Status: AC
  Filled 2018-11-20: qty 2

## 2018-11-20 MED ORDER — SODIUM CHLORIDE 0.9% FLUSH: 3.0000 mL | INTRAVENOUS | Status: DC | PRN

## 2018-11-20 MED ORDER — DIPHENHYDRAMINE HCL 25 MG PO CAPS
25.0000 mg | ORAL_CAPSULE | Freq: Four times a day (QID) | ORAL | Status: DC | PRN
  Administered 2018-11-20: 25 mg via ORAL
  Filled 2018-11-20: qty 1

## 2018-11-20 MED ORDER — SOD CITRATE-CITRIC ACID 500-334 MG/5ML PO SOLN
ORAL | Status: AC
  Filled 2018-11-20: qty 15

## 2018-11-20 MED ORDER — OXYTOCIN 40 UNITS IN NORMAL SALINE INFUSION - SIMPLE MED: 2.5000 [IU]/h | INTRAVENOUS | Status: AC

## 2018-11-20 MED ORDER — SOD CITRATE-CITRIC ACID 500-334 MG/5ML PO SOLN: 30.0000 mL | ORAL | Status: DC

## 2018-11-20 MED ORDER — COCONUT OIL OIL: 1.0000 "application " | TOPICAL_OIL | Status: DC | PRN

## 2018-11-20 MED ORDER — TETANUS-DIPHTH-ACELL PERTUSSIS 5-2.5-18.5 LF-MCG/0.5 IM SUSP: 0.5000 mL | Freq: Once | INTRAMUSCULAR | Status: DC

## 2018-11-20 MED ORDER — ONDANSETRON HCL 4 MG/2ML IJ SOLN: 4.0000 mg | Freq: Three times a day (TID) | INTRAMUSCULAR | Status: DC | PRN

## 2018-11-20 MED ORDER — MORPHINE SULFATE (PF) 0.5 MG/ML IJ SOLN
INTRAMUSCULAR | Status: DC | PRN
  Administered 2018-11-20: .15 mg via INTRATHECAL

## 2018-11-20 MED ORDER — SODIUM CHLORIDE 0.9 % IV SOLN
INTRAVENOUS | Status: DC | PRN
  Administered 2018-11-20: 40 [IU] via INTRAVENOUS

## 2018-11-20 MED ORDER — OXYCODONE-ACETAMINOPHEN 5-325 MG PO TABS
1.0000 | ORAL_TABLET | ORAL | Status: DC | PRN
  Administered 2018-11-21: 13:00:00 1 via ORAL
  Administered 2018-11-22: 2 via ORAL
  Filled 2018-11-20: qty 1
  Filled 2018-11-20: qty 2

## 2018-11-20 MED ORDER — DIPHENHYDRAMINE HCL 50 MG/ML IJ SOLN: 12.5000 mg | INTRAMUSCULAR | Status: DC | PRN

## 2018-11-20 MED ORDER — EPHEDRINE 5 MG/ML INJ
INTRAVENOUS | Status: AC
  Filled 2018-11-20: qty 10

## 2018-11-20 MED ORDER — DEXTROSE 5 % IV SOLN
INTRAVENOUS | Status: AC
  Filled 2018-11-20: qty 3000

## 2018-11-20 MED ORDER — SODIUM CHLORIDE 0.9 % IV SOLN: 500.0000 mg | INTRAVENOUS | Status: DC

## 2018-11-20 MED ORDER — WITCH HAZEL-GLYCERIN EX PADS: 1.0000 "application " | MEDICATED_PAD | CUTANEOUS | Status: DC | PRN

## 2018-11-20 MED ORDER — FENTANYL CITRATE (PF) 100 MCG/2ML IJ SOLN
INTRAMUSCULAR | Status: DC | PRN
  Administered 2018-11-20: 85 ug via INTRAVENOUS
  Administered 2018-11-20: 15 ug via INTRATHECAL

## 2018-11-20 MED ORDER — ACETAMINOPHEN 500 MG PO TABS
1000.0000 mg | ORAL_TABLET | Freq: Four times a day (QID) | ORAL | Status: AC
  Administered 2018-11-20 – 2018-11-21 (×4): 1000 mg via ORAL
  Filled 2018-11-20 (×4): qty 2

## 2018-11-20 MED ORDER — DIPHENHYDRAMINE HCL 25 MG PO CAPS: 25.0000 mg | ORAL_CAPSULE | ORAL | Status: DC | PRN

## 2018-11-20 MED ORDER — MORPHINE SULFATE (PF) 0.5 MG/ML IJ SOLN
INTRAMUSCULAR | Status: AC
  Filled 2018-11-20: qty 10

## 2018-11-20 MED ORDER — SIMETHICONE 80 MG PO CHEW
80.0000 mg | CHEWABLE_TABLET | ORAL | Status: DC
  Administered 2018-11-21 – 2018-11-22 (×2): 80 mg via ORAL
  Filled 2018-11-20 (×2): qty 1

## 2018-11-20 MED ORDER — SIMETHICONE 80 MG PO CHEW: 80.0000 mg | CHEWABLE_TABLET | ORAL | Status: DC | PRN

## 2018-11-20 MED ORDER — NALOXONE HCL 0.4 MG/ML IJ SOLN: 0.4000 mg | INTRAMUSCULAR | Status: DC | PRN

## 2018-11-20 MED ORDER — KETOROLAC TROMETHAMINE 30 MG/ML IJ SOLN
INTRAMUSCULAR | Status: AC
  Filled 2018-11-20: qty 1

## 2018-11-20 MED ORDER — BUPIVACAINE IN DEXTROSE 0.75-8.25 % IT SOLN
INTRATHECAL | Status: DC | PRN
  Administered 2018-11-20: 1.4 mL via INTRATHECAL

## 2018-11-20 MED ORDER — MENTHOL 3 MG MT LOZG: 1.0000 | LOZENGE | OROMUCOSAL | Status: DC | PRN

## 2018-11-20 SURGICAL SUPPLY — 43 items
BARRIER ADHS 3X4 INTERCEED (GAUZE/BANDAGES/DRESSINGS) IMPLANT
CHLORAPREP W/TINT 26ML (MISCELLANEOUS) ×3 IMPLANT
CLAMP CORD UMBIL (MISCELLANEOUS) IMPLANT
DRSG OPSITE POSTOP 4X10 (GAUZE/BANDAGES/DRESSINGS) ×3 IMPLANT
DRSG PAD ABDOMINAL 8X10 ST (GAUZE/BANDAGES/DRESSINGS) ×3 IMPLANT
ELECT REM PT RETURN 9FT ADLT (ELECTROSURGICAL) ×3
ELECTRODE REM PT RTRN 9FT ADLT (ELECTROSURGICAL) ×1 IMPLANT
EXTRACTOR VACUUM KIWI (MISCELLANEOUS) IMPLANT
GLOVE BIO SURGEON STRL SZ 6.5 (GLOVE) ×2 IMPLANT
GLOVE BIO SURGEONS STRL SZ 6.5 (GLOVE) ×1
GLOVE BIOGEL PI IND STRL 7.0 (GLOVE) ×1 IMPLANT
GLOVE BIOGEL PI INDICATOR 7.0 (GLOVE) ×2
GOWN STRL REUS W/ TWL LRG LVL3 (GOWN DISPOSABLE) ×2 IMPLANT
GOWN STRL REUS W/TWL LRG LVL3 (GOWN DISPOSABLE) ×4
HOVERMATT SINGLE USE (MISCELLANEOUS) ×3 IMPLANT
KIT ABG SYR 3ML LUER SLIP (SYRINGE) IMPLANT
NEEDLE HYPO 25X5/8 SAFETYGLIDE (NEEDLE) IMPLANT
NEEDLE SPNL 18GX3.5 QUINCKE PK (NEEDLE) ×3 IMPLANT
NS IRRIG 1000ML POUR BTL (IV SOLUTION) ×3 IMPLANT
PACK C SECTION WH (CUSTOM PROCEDURE TRAY) ×3 IMPLANT
PAD OB MATERNITY 4.3X12.25 (PERSONAL CARE ITEMS) ×3 IMPLANT
PENCIL SMOKE EVAC W/HOLSTER (ELECTROSURGICAL) ×3 IMPLANT
RETRACTOR TRAXI PANNICULUS (MISCELLANEOUS) ×1 IMPLANT
SPONGE LAP 18X18 RF (DISPOSABLE) ×9 IMPLANT
SUT PDS AB 0 CTX 60 (SUTURE) IMPLANT
SUT PLAIN 2 0 (SUTURE) ×2
SUT PLAIN ABS 2-0 CT1 27XMFL (SUTURE) ×1 IMPLANT
SUT VIC AB 0 CT1 27 (SUTURE)
SUT VIC AB 0 CT1 27XBRD ANBCTR (SUTURE) IMPLANT
SUT VIC AB 0 CT1 36 (SUTURE) IMPLANT
SUT VIC AB 0 CTX 36 (SUTURE) ×2
SUT VIC AB 0 CTX36XBRD ANBCTRL (SUTURE) ×1 IMPLANT
SUT VIC AB 2-0 CT1 27 (SUTURE) ×2
SUT VIC AB 2-0 CT1 TAPERPNT 27 (SUTURE) ×1 IMPLANT
SUT VIC AB 2-0 CTX 36 (SUTURE) ×6 IMPLANT
SUT VIC AB 3-0 CT1 27 (SUTURE) ×2
SUT VIC AB 3-0 CT1 TAPERPNT 27 (SUTURE) ×1 IMPLANT
SUT VIC AB 3-0 SH 27 (SUTURE)
SUT VIC AB 3-0 SH 27X BRD (SUTURE) IMPLANT
SYR 30ML LL (SYRINGE) ×3 IMPLANT
TOWEL OR 17X24 6PK STRL BLUE (TOWEL DISPOSABLE) ×3 IMPLANT
TRAXI PANNICULUS RETRACTOR (MISCELLANEOUS) ×2
TRAY FOLEY BAG SILVER LF 14FR (SET/KITS/TRAYS/PACK) ×3 IMPLANT

## 2018-11-20 NOTE — Anesthesia Procedure Notes (Signed)
Spinal  Patient location during procedure: OR Start time: 11/20/2018 1:54 AM End time: 11/20/2018 1:56 AM Staffing Anesthesiologist: Lyn Hollingshead, MD Performed: anesthesiologist  Preanesthetic Checklist Completed: patient identified, site marked, surgical consent, pre-op evaluation, timeout performed, IV checked, risks and benefits discussed and monitors and equipment checked Spinal Block Patient position: sitting Prep: site prepped and draped and DuraPrep Patient monitoring: continuous pulse ox and blood pressure Approach: midline Location: L3-4 Injection technique: single-shot Needle Needle type: Pencan  Needle gauge: 24 G Needle length: 10 cm Needle insertion depth: 6 cm Assessment Sensory level: T4

## 2018-11-20 NOTE — Discharge Summary (Addendum)
Postpartum Discharge Summary     Patient Name: Krystal Hudson DOB: 12/28/1995 MRN: [id number]  Date of admission: 11/18/2018 Delivering Provider: DOVE, MYRA C Dr. Eckstat  Date of discharge: 11/22/2018  Admitting diagnosis: HBP  Intrauterine pregnancy: [redacted]w[redacted]d     Secondary diagnosis:  Active Problems:   Supervision of normal first pregnancy, antepartum   Anemia in pregnancy   Maternal morbid obesity, antepartum (HCC)   Indication for care in labor or delivery  Additional problems: new onset hypertension in pregnancy Morbid obesity (BMI 53) Excessive weight gain in pregnancy (more than 75 pounds) Arrest of descent/dilation Anemia in pregnancy + GBS     Discharge diagnosis: Term Pregnancy Delivered, Gestational Hypertension and Anemia                                                                                                Post partum procedures:PP IUD placement  Augmentation: AROM, Pitocin, Cytotec and Foley Balloon  Complications: None  Hospital course:  Onset of Labor With Unplanned C/S  23 y.o. yo G1P0 at [redacted]w[redacted]d was admitted in Latent Labor on 11/18/2018 for IOL for new diagnosis of gHTN. Patient had a labor course significant for: 1cm on arrival, induced with cytotec x3, foley balloon, AROM, and pitocin. Membrane Rupture Time/Date: 6:33 PM ,11/19/2018  . BP's well controlled after admission.  The patient went for cesarean section due to Arrest of Dilation, and delivered a Viable infant,11/20/2018  Details of operation can be found in separate operative note. Patient had an uncomplicated postpartum course.  She is ambulating,tolerating a regular diet, passing flatus, and urinating well.  Patient is discharged home in stable condition 11/22/18. Delivery time: 11/20/18 at 0223   Magnesium Sulfate received: No BMZ received: No Rhophylac:N/A MMR:N/A Transfusion:No  Physical exam  Vitals:   11/21/18 0500 11/21/18 1459 11/21/18 2100 11/22/18 0510  BP: 115/60 119/65 110/66  114/76  Pulse: 92 82 94 93  Resp: 18 19 19 18  Temp: 97.9 F (36.6 C) 98.4 F (36.9 C) 98.3 F (36.8 C) 98.2 F (36.8 C)  TempSrc: Oral Oral Oral Oral  SpO2: 100% 100%  100%  Weight:      Height:       General: alert, cooperative and no distress Lochia: appropriate Uterine Fundus: firm Incision: Dressing is clean, dry, and intact DVT Evaluation: No evidence of DVT seen on physical exam. Labs: Lab Results  Component Value Date   WBC 9.6 11/20/2018   HGB 8.0 (L) 11/20/2018   HCT 25.8 (L) 11/20/2018   MCV 76.3 (L) 11/20/2018   PLT 255 11/20/2018   CMP Latest Ref Rng & Units 11/18/2018  Glucose 70 - 99 mg/dL 119(H)  BUN 6 - 20 mg/dL 6  Creatinine 0.44 - 1.00 mg/dL 0.65  Sodium 135 - 145 mmol/L 137  Potassium 3.5 - 5.1 mmol/L 3.7  Chloride 98 - 111 mmol/L 108  CO2 22 - 32 mmol/L 20(L)  Calcium 8.9 - 10.3 mg/dL 8.6(L)  Total Protein 6.5 - 8.1 g/dL 5.9(L)  Total Bilirubin 0.3 - 1.2 mg/dL 0.2(L)  Alkaline Phos 38 - 126 U/L 136(H)  AST 15 -   41 U/L 18  ALT 0 - 44 U/L 14    Discharge instruction: per After Visit Summary and "Baby and Me Booklet".  After visit meds:  Allergies as of 11/22/2018   No Known Allergies     Medication List    STOP taking these medications   cyclobenzaprine 10 MG tablet Commonly known as: FLEXERIL     TAKE these medications   ferrous sulfate 325 (65 FE) MG tablet Take 1 tablet (325 mg total) by mouth daily.   ibuprofen 800 MG tablet Commonly known as: ADVIL Take 1 tablet (800 mg total) by mouth every 8 (eight) hours.   multivitamin-prenatal 27-0.8 MG Tabs tablet Take 1 tablet by mouth daily at 12 noon.   oxyCODONE-acetaminophen 5-325 MG tablet Commonly known as: PERCOCET/ROXICET Take 1-2 tablets by mouth every 4 (four) hours as needed for up to 5 days for moderate pain.   senna-docusate 8.6-50 MG tablet Commonly known as: Senokot-S Take 2 tablets by mouth daily. Start taking on: November 23, 2018   Tdap 5-2.5-18.5 LF-MCG/0.5  injection Commonly known as: BOOSTRIX Inject 0.5 mLs into the muscle once for 1 dose.            Discharge Care Instructions  (From admission, onward)         Start     Ordered   11/22/18 0000  Discharge wound care:     11/22/18 0622          Diet: routine diet  Activity: Advance as tolerated. Pelvic rest for 6 weeks.   Outpatient follow up:4 weeks Follow up Appt:No future appointments. Follow up Visit: Follow-up Information    Dawson, Rolitta, CNM. Schedule an appointment as soon as possible for a visit in 2 week(s).   Specialty: Obstetrics and Gynecology Why: incision check and 6 weeks for postpartum exam Contact information: [address] [place] [address] [phone]            Please schedule this patient for Postpartum visit in: 4 weeks with the following provider: Any provider For C/S patients schedule nurse incision check in weeks 2 weeks: yes High risk pregnancy complicated by: gHTN Delivery mode:  CS Anticipated Birth Control:  Received postpartum IUD after delivery PP Procedures needed: Incision check, 1 week BP check  Schedule Integrated BH visit: no     Newborn Data: Live born adult  Birth Weight:  3850g APGAR: 9, 9   Newborn Delivery   Birth date/time:  Delivery type:       Baby Feeding: Breast Disposition:home with mother   11/22/2018 [name], DO  I confirm that I have verified the information documented in the resident's note and that I have also personally reperformed the history, physical exam and all medical decision making activities of this service and have verified that all service and findings are accurately documented in this student's note.   Neill, Caroline M, CNM 11/22/2018 8:47 AM     

## 2018-11-20 NOTE — Op Note (Addendum)
11/18/2018 - 11/20/2018  2:50 AM  PATIENT:  Krystal Hudson  23 y.o. female  PRE-OPERATIVE DIAGNOSIS:  Arrest of descent/dilation, desire for postplacental IUD, morbid obesity  POST-OPERATIVE DIAGNOSIS:  same  PROCEDURE:  Procedure(s): CESAREAN SECTION (N/A)  SURGEON:  Surgeon(s) and Role:    * Hulan Fray, Wilhemina Cash, MD - Primary    * Clarnce Flock, MD - Fellow  ANESTHESIA:   local and spinal  EBL:     BLOOD ADMINISTERED:none  DRAINS: Urinary Catheter (Foley)   LOCAL MEDICATIONS USED:  MARCAINE     SPECIMEN:  Source of Specimen:  cord blood  DISPOSITION OF SPECIMEN:  PATHOLOGY  COUNTS:  YES  TOURNIQUET:  * No tourniquets in log *  DICTATION: .Dragon Dictation  PLAN OF CARE: Admit to inpatient   PATIENT DISPOSITION:  PACU - hemodynamically stable.   Delay start of Pharmacological VTE agent (>24hrs) due to surgical blood loss or risk of bleeding: not applicable   The risks, benefits, and alternatives of surgery were explained, understood, accepted. Consents were signed. All questions were answered. In the operating room spinal anesthesia was applied without complication. Her abdomen and vagina were prepped and draped in the usual sterile fashion. A Foley catheter was placed, draining clear urine throughout case. Timeout procedure was done. After adequate anesthesia was assured 30 mL for 0.5% Marcaine was injected into the subcutaneous tissue about 2 cm above the symphysis pubis.  An incision was made there. The incision was carried down through the subcutaneous tissue to the fascia. The fascia was scored the midline and extended bilaterally. The rectus fascia was dissected off of the rectus muscles. Excellent hemostasis was maintained. The muscles were pulled towards each side. The peritoneum was entered with hemostats. Peritoneal incision was extended bilaterally with the Bovie. The bladder blade was placed. A transverse incision was made on the very well-developed lower uterine  segment. The uterine incision was extended with traction on each side. Amniotomy was performed with a hemostat. Clear fluid was noted. The baby was delivered from a vertex presentation.The mouth and nostrils were suctioned prior to delivery of the shoulders.  The baby's cord was clamped and cut after 1 minute. The baby was transferred to the NICU personnel for routine care. The placenta was delivered intact with traction. The uterus was left in situ and the interior was cleaned with a dry lap sponge. Liletta IUD was placed, with the strings being placed into the vagina with a Kelly clamp.  The uterine incision was closed with 2-0 Vicryl running locking suture. A small hematoma developed on the left side of the incision.  Hemostasis was achieved by placing 2 figure of eight sutures.  By tilting the uterus each side was able to visualize the adnexa which were normal.  The rectus fascia rectus muscles were noted be hemostatic as well. The fascia was closed with a 0 vicryl in a running nonlocking fashion. No defects were palpable. The subcutaneous tissue was irrigated, clean, and dried. A subcuticular closure was done with a  2-0 plain gut Vicryl suture. The skin was closed with 3-0 vicryl suture. Excellent cosmetic results were obtained. She was taken to the recovery room in stable condition. She tolerated the procedure well.

## 2018-11-20 NOTE — Transfer of Care (Signed)
Immediate Anesthesia Transfer of Care Note  Patient: Krystal Hudson  Procedure(s) Performed: CESAREAN SECTION (N/A )  Patient Location: PACU  Anesthesia Type:Spinal  Level of Consciousness: awake and alert   Airway & Oxygen Therapy: Patient Spontanous Breathing  Post-op Assessment: Report given to RN and Post -op Vital signs reviewed and stable  Post vital signs: Reviewed and stable  Last Vitals:  Vitals Value Taken Time  BP 145/76 11/20/18 0322  Temp 36.8 C 11/20/18 0322  Pulse 85 11/20/18 0323  Resp 14 11/20/18 0323  SpO2 98 % 11/20/18 0323  Vitals shown include unvalidated device data.  Last Pain:  Vitals:   11/20/18 0322  TempSrc: Oral  PainSc:       Patients Stated Pain Goal: 8 (52/77/82 4235)  Complications: No apparent anesthesia complications

## 2018-11-20 NOTE — Progress Notes (Signed)
Patient ID: Krystal Hudson, female   DOB: Jul 17, 1995, 23 y.o.   MRN: [id number]  At about 0115 I evaluated this patient. Her MVUs had been adequate for about 5 hours and she did not make any cervical change in that time. I checked her cervix and found her to be about 5 cm dilated and baby at the -2 station. I discussed the situation with her and offered her a PLTCS. We discussed the risks as well as continued labor. She opted for a PLTCS. The appropriate personnel were notified.

## 2018-11-20 NOTE — Anesthesia Preprocedure Evaluation (Signed)
Anesthesia Evaluation  Patient identified by MRN, date of birth, ID band Patient awake    Reviewed: Allergy & Precautions, H&P , NPO status , Patient's Chart, lab work & pertinent test results  Airway Mallampati: III  TM Distance: >3 FB Neck ROM: full    Dental no notable dental hx. (+) Teeth Intact   Pulmonary neg pulmonary ROS,    Pulmonary exam normal breath sounds clear to auscultation       Cardiovascular negative cardio ROS Normal cardiovascular exam Rhythm:regular Rate:Normal     Neuro/Psych negative neurological ROS  negative psych ROS   GI/Hepatic negative GI ROS, Neg liver ROS,   Endo/Other  Morbid obesity  Renal/GU negative Renal ROS  negative genitourinary   Musculoskeletal   Abdominal (+) + obese,   Peds  Hematology  (+) Blood dyscrasia, anemia ,   Anesthesia Other Findings   Reproductive/Obstetrics (+) Pregnancy                             Anesthesia Physical Anesthesia Plan  ASA: III  Anesthesia Plan: Spinal   Post-op Pain Management:    Induction:   PONV Risk Score and Plan: 3 and Ondansetron, Dexamethasone and Scopolamine patch - Pre-op  Airway Management Planned: Nasal Cannula and Natural Airway  Additional Equipment: None  Intra-op Plan:   Post-operative Plan:   Informed Consent: I have reviewed the patients History and Physical, chart, labs and discussed the procedure including the risks, benefits and alternatives for the proposed anesthesia with the patient or authorized representative who has indicated his/her understanding and acceptance.       Plan Discussed with: CRNA  Anesthesia Plan Comments:         Anesthesia Quick Evaluation

## 2018-11-20 NOTE — Discharge Instructions (Signed)

## 2018-11-21 ENCOUNTER — Encounter (HOSPITAL_COMMUNITY): Payer: Self-pay | Admitting: Obstetrics & Gynecology

## 2018-11-21 NOTE — Anesthesia Postprocedure Evaluation (Signed)
Anesthesia Post Note  Patient: Krystal Hudson  Procedure(s) Performed: CESAREAN SECTION (N/A )     Patient location during evaluation: PACU Anesthesia Type: Spinal Level of consciousness: awake and sedated Pain management: pain level controlled Vital Signs Assessment: post-procedure vital signs reviewed and stable Respiratory status: spontaneous breathing Cardiovascular status: stable Postop Assessment: no headache, no backache, spinal receding, patient able to bend at knees and no apparent nausea or vomiting Anesthetic complications: no    Last Vitals:  Vitals:   11/21/18 0214 11/21/18 0500  BP: 137/67 115/60  Pulse: 91 92  Resp: 18 18  Temp: 36.7 C 36.6 C  SpO2: 100% 100%    Last Pain:  Vitals:   11/21/18 1018  TempSrc:   PainSc: 6    Pain Goal: Patients Stated Pain Goal: 8 (11/19/18 0445)                 Huston Foley

## 2018-11-21 NOTE — Progress Notes (Addendum)
POSTPARTUM PROGRESS NOTE  POD #1  Subjective:  Krystal Hudson is a 23 y.o. G1P0 s/p pLTCS at [redacted]w[redacted]d for AoD.  She reports she doing well. No acute events overnight.  She denies any problems with ambulating, voiding or po intake. Denies nausea or vomiting. She has passed flatus. Pain is well controlled.  Lochia is mild.  Objective: Blood pressure 115/60, pulse 92, temperature 97.9 F (36.6 C), temperature source Oral, resp. rate 18, height 5\' 4"  (1.626 m), weight (!) 141.1 kg, last menstrual period 02/14/2018, SpO2 100 %.  Physical Exam:  General: alert, cooperative and no distress Chest: no respiratory distress Heart:regular rate, distal pulses intact Abdomen: soft, nontender,  Uterine Fundus: firm, appropriately tender DVT Evaluation: No calf swelling or tenderness Extremities: mild LE edema, symmetric Skin: warm, dry; incision clean/dry/intact w/ honeycomb dressing in place  Recent Labs    11/18/18 1912 11/20/18 0538  HGB 9.9* 8.0*  HCT 31.1* 25.8*    Assessment/Plan: Krystal Hudson is a 23 y.o. G1P0 s/p pLTCS at [redacted]w[redacted]d for AoD.  POD#1 - Doing welll; pain well controlled. H/H appropriate  Routine postpartum care  OOB, ambulated  SCDs for VTE prophylaxis Anemia: asymptomatic  Start po ferrous sulfate BID at discharge H/o gHTN: blood pressures normotensive overnight. Continue to monitor. Contraception: IUD Feeding: Breast  Dispo: Plan for discharge 10/5.   LOS: 2 days   Mina Marble, D.O. [place], PGY2 11/21/2018, 6:01 AM  OB FELLOW ATTESTATION  I have seen and examined this patient and agree with above documentation in the resident's note except as noted below.  Will discuss inpatient vs outpatient circ with patient Otherwise agree with above  Augustin Coupe, MD/[facility] OB Fellow  11/21/2018, 8:04 AM

## 2018-11-22 ENCOUNTER — Encounter (HOSPITAL_COMMUNITY): Payer: Self-pay | Admitting: *Deleted

## 2018-11-22 MED ORDER — SENNOSIDES-DOCUSATE SODIUM 8.6-50 MG PO TABS: 2.0000 | ORAL_TABLET | ORAL | 0 refills | Status: DC

## 2018-11-22 MED ORDER — IBUPROFEN 800 MG PO TABS: 800.0000 mg | ORAL_TABLET | Freq: Three times a day (TID) | ORAL | 0 refills | Status: DC

## 2018-11-22 MED ORDER — OXYCODONE-ACETAMINOPHEN 5-325 MG PO TABS: 1.0000 | ORAL_TABLET | ORAL | 0 refills | Status: AC | PRN

## 2018-11-22 MED ORDER — TETANUS-DIPHTH-ACELL PERTUSSIS 5-2.5-18.5 LF-MCG/0.5 IM SUSP: 0.5000 mL | Freq: Once | INTRAMUSCULAR | 0 refills | Status: AC

## 2018-11-23 ENCOUNTER — Encounter: Payer: Self-pay | Admitting: General Practice

## 2018-11-28 ENCOUNTER — Inpatient Hospital Stay (HOSPITAL_COMMUNITY): Payer: Medicaid Other

## 2018-11-28 ENCOUNTER — Inpatient Hospital Stay (HOSPITAL_COMMUNITY)
Admission: AD | Admit: 2018-11-28 | Payer: Medicaid Other | Source: Home / Self Care | Admitting: Obstetrics & Gynecology

## 2018-12-03 ENCOUNTER — Other Ambulatory Visit: Payer: Self-pay

## 2018-12-03 ENCOUNTER — Encounter: Payer: Self-pay | Admitting: Advanced Practice Midwife

## 2018-12-03 ENCOUNTER — Ambulatory Visit (INDEPENDENT_AMBULATORY_CARE_PROVIDER_SITE_OTHER): Payer: 59 | Admitting: Advanced Practice Midwife

## 2018-12-03 VITALS — BP 144/89 | HR 80 | Temp 98.0°F | Wt 287.7 lb

## 2018-12-03 DIAGNOSIS — Z98891 History of uterine scar from previous surgery: Secondary | ICD-10-CM

## 2018-12-03 DIAGNOSIS — O139 Gestational [pregnancy-induced] hypertension without significant proteinuria, unspecified trimester: Secondary | ICD-10-CM

## 2018-12-03 MED ORDER — AMLODIPINE BESYLATE 10 MG PO TABS: 10.0000 mg | ORAL_TABLET | Freq: Every day | ORAL | 1 refills | Status: DC

## 2018-12-03 NOTE — Progress Notes (Signed)
  Subjective:     Patient ID: Krystal Hudson, female   DOB: 1995-08-25, 23 y.o.   MRN: [id number]  Krystal Hudson is a 23 y.o. G1P1001 who is 13 days SP LTCS. She was induced for GHTN at 39 weeks. She denies any HA, visual disturbance or RUQ pain today. She has not checked her BP at home since leaving the hospital. She reports that her pain is improved and she is feeling well overall post-operatively.   Wound Check She was originally treated 10 to 14 days ago. Prior ED Treatment: c-section  Her temperature was unmeasured prior to arrival. There has been no drainage from the wound. There is no redness present. There is no swelling present. There is no pain present.     Review of Systems  All other systems reviewed and are negative.      Objective:   Physical Exam Vitals signs and nursing note reviewed.  Constitutional:      General: She is not in acute distress. HENT:     Head: Normocephalic.  Cardiovascular:     Rate and Rhythm: Normal rate.  Pulmonary:     Effort: Pulmonary effort is normal.  Skin:    General: Skin is warm and dry.  Neurological:     Mental Status: She is alert.  Psychiatric:        Mood and Affect: Mood normal.    Incision: C/D/I well healed.     Assessment:     1. S/P cesarean section   2. Gestational hypertension, antepartum        Plan:     BP elevated today, denies SX  Norvasc 10mg  daily sent to pharmacy Will have patient have a BP check early next week Warning signs reviewed   Marcille Buffy DNP, CNM  12/03/18  9:54 AM

## 2018-12-07 ENCOUNTER — Telehealth: Payer: 59

## 2018-12-13 ENCOUNTER — Telehealth: Payer: Self-pay | Admitting: *Deleted

## 2018-12-13 NOTE — Telephone Encounter (Signed)
Patient called requesting a note to return to work. Patient postpartum visit is scheduled on 12/30/2018. If note is provided, please write if there are any work restrictions. 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  Derl Barrow, RN

## 2018-12-14 ENCOUNTER — Telehealth: Payer: Self-pay | Admitting: General Practice

## 2018-12-14 ENCOUNTER — Encounter: Payer: Self-pay | Admitting: General Practice

## 2018-12-14 NOTE — Telephone Encounter (Signed)
-----   Message from [place], [place] sent at 12/13/2018  6:23 PM EDT ----- Regarding: Note to Return to Work She can have a note to return to work with no restrictions. I do not know the date she wants to return, so you will need to ask her.  Thanks, Ro

## 2018-12-14 NOTE — Telephone Encounter (Signed)
Pt is scheduled for PP visit on 12/30/2018.  Pt requesting return back to work note.  Consulted with provider, Rolitta Dawson,CNM.  Pt may return back to work without any restrictions.

## 2018-12-20 ENCOUNTER — Encounter: Payer: Self-pay | Admitting: Advanced Practice Midwife

## 2018-12-20 DIAGNOSIS — Z975 Presence of (intrauterine) contraceptive device: Secondary | ICD-10-CM | POA: Insufficient documentation

## 2018-12-30 ENCOUNTER — Ambulatory Visit (INDEPENDENT_AMBULATORY_CARE_PROVIDER_SITE_OTHER): Payer: Medicaid Other | Admitting: Obstetrics and Gynecology

## 2018-12-30 ENCOUNTER — Encounter: Payer: Self-pay | Admitting: Obstetrics and Gynecology

## 2018-12-30 ENCOUNTER — Other Ambulatory Visit: Payer: Self-pay

## 2018-12-30 DIAGNOSIS — Z1389 Encounter for screening for other disorder: Secondary | ICD-10-CM

## 2018-12-30 DIAGNOSIS — F53 Postpartum depression: Secondary | ICD-10-CM

## 2018-12-30 NOTE — Progress Notes (Addendum)
   Post Partum Exam  Krystal Hudson is a 23 y.o. G3P1001 female who presents for a postpartum visit. She is 7 weeks postpartum following a low cervical vertical Cesarean section. I have fully reviewed the prenatal and intrapartum course. The delivery was at 35 gestational weeks.  Anesthesia: epidural. Postpartum course has been uncomplicated. Baby's course has been uncompliated. Baby is feeding by bottle Dory Horn. Bleeding changing a maxi pad 3 times a day and small amount of dark blood at times.. Bowel function is normal. Bladder function is normal. Patient is not sexually active. Contraception method is IUD. Postpartum depression screening:positive score 26.  The following portions of the patient's history were reviewed and updated as appropriate: allergies, current medications, past family history, past medical history, past social history, past surgical history and problem list. Last pap smear done 05/19/2018 and was Normal  Review of Systems Constitutional: negative Eyes: negative Ears, nose, mouth, throat, and face: negative Respiratory: negative Cardiovascular: negative Gastrointestinal: negative Genitourinary:negative Integument/breast: negative Hematologic/lymphatic: negative Musculoskeletal:negative Neurological: negative Behavioral/Psych: positive for depression Endocrine: negative Allergic/Immunologic: negative    Objective:  Blood pressure 135/87, pulse 96, temperature 98 F (36.7 C), temperature source Oral, height 5\' 4"  (1.626 m), weight 275 lb 3.2 oz (124.8 kg), last menstrual period 02/14/2018, not currently breastfeeding.  General:  alert, cooperative and no distress   Breasts:  inspection negative, no nipple discharge or bleeding, no masses or nodularity palpable  Lungs: clear to auscultation bilaterally  Heart:  regular rate and rhythm, S1, S2 normal, no murmur, click, rub or gallop  Abdomen: soft, non-tender; bowel sounds normal; no masses,  no organomegaly incision  C/D/I well-healed with minimal scarring   Vulva:  normal  Vagina: normal vagina, no discharge, exudate, lesion, or erythema  Cervix:  multiparous appearance - IUD string visualized, no excess to trim  Corpus: normal size, contour, position, consistency, mobility, non-tender  Adnexa:  normal adnexa  Rectal Exam: Not performed.        Assessment:    Normal postpartum exam with moderate PPD. Pap smear not done at today's visit.   Plan:   1. Contraception: IUD 2. Referral to [facility] for PPD evaluation 3. Follow up in: 1 year or as needed.   Laury Deep, CNM

## 2018-12-30 NOTE — Patient Instructions (Signed)
Perinatal Depression When a woman feels excessive sadness, anger, or anxiety during pregnancy or during the first 12 months after she gives birth, she has a condition called perinatal depression. Depression can interfere with work, school, relationships, and other everyday activities. If it is not managed properly, it can also cause problems in the mother and her baby. Sometimes, perinatal depression is left untreated because symptoms are thought to be normal mood swings during and right after pregnancy. If you have symptoms of depression, it is important to talk with your health care provider. What are the causes? The exact cause of this condition is not known. Hormonal changes during and after pregnancy may play a role in causing perinatal depression. What increases the risk? You are more likely to develop this condition if:  You have a personal or family history of depression, anxiety, or mood disorders.  You experience a stressful life event during pregnancy, such as the death of a loved one.  You have a lot of regular life stress.  You do not have support from family members or loved ones, or you are in an abusive relationship. What are the signs or symptoms? Symptoms of this condition include:  Feeling sad or hopeless.  Feelings of guilt.  Feeling irritable or overwhelmed.  Changes in your appetite.  Lack of energy or motivation.  Sleep problems.  Difficulty concentrating or completing tasks.  Loss of interest in hobbies or relationships.  Headaches or stomach problems that do not go away. How is this diagnosed? This condition is diagnosed based on a physical exam and mental evaluation. In some cases, your health care provider may use a depression screening tool. These tools include a list of questions that can help a health care provider diagnose depression. Your health care provider may refer you to a mental health expert who specializes in depression. How is this  treated? This condition may be treated with:  Medicines. Your health care provider will only give you medicines that have been proven safe for pregnancy and breastfeeding.  Talk therapy with a mental health professional to help change your patterns of thinking (cognitive behavioral therapy).  Support groups.  Brain stimulation or light therapies.  Stress reduction therapies, such as mindfulness. Follow these instructions at home: Lifestyle  Do not use any products that contain nicotine or tobacco, such as cigarettes and e-cigarettes. If you need help quitting, ask your health care provider.  Do not use alcohol when you are pregnant. After your baby is born, limit alcohol intake to no more than 1 drink a day. One drink equals 12 oz of beer, 5 oz of wine, or 1 oz of hard liquor.  Consider joining a support group for new mothers. Ask your health care provider for recommendations.  Take good care of yourself. Make sure you: ? Get plenty of sleep. If you are having trouble sleeping, talk with your health care provider. ? Eat a healthy diet. This includes plenty of fruits and vegetables, whole grains, and lean proteins. ? Exercise regularly, as told by your health care provider. Ask your health care provider what exercises are safe for you. General instructions  Take over-the-counter and prescription medicines only as told by your health care provider.  Talk with your partner or family members about your feelings during pregnancy. Share any concerns or anxieties that you may have.  Ask for help with tasks or chores when you need it. Ask friends and family members to provide meals, watch your children, or help with   cleaning.  Keep all follow-up visits as told by your health care provider. This is important. Contact a health care provider if:  You (or people close to you) notice that you have any symptoms of depression.  You have depression and your symptoms get worse.  You  experience side effects from medicines, such as nausea or sleep problems. Get help right away if:  You feel like hurting yourself, your baby, or someone else. If you ever feel like you may hurt yourself or others, or have thoughts about taking your own life, get help right away. You can go to your nearest emergency department or call:  Your local emergency services (911 in the [place]  A suicide crisis helpline, such as the National Suicide Prevention Lifeline at (817) 520-6513. This is open 24 hours a day. Summary  Perinatal depression is when a woman feels excessive sadness, anger, or anxiety during pregnancy or during the first 12 months after she gives birth.  If perinatal depression is not treated, it can lead to health problems for the mother and her baby.  This condition is treated with medicines, talk therapy, stress reduction therapies, or a combination of two or more treatments.  Talk with your partner or family members about your feelings. Do not be afraid to ask for help. This information is not intended to replace advice given to you by your health care provider. Make sure you discuss any questions you have with your health care provider. Document Released: 04/02/2016 Document Revised: 02/06/2017 Document Reviewed: 04/02/2016 Elsevier Patient Education  2020 [employer]. Postpartum Baby Blues The postpartum period begins right after the birth of a baby. During this time, there is often a lot of joy and excitement. It is also a time of many changes in the life of the parents. No matter how many times a mother gives birth, each child brings new challenges to the family, including different ways of relating to one another. It is common to have feelings of excitement along with confusing changes in moods, emotions, and thoughts. You may feel happy one minute and sad or stressed the next. These feelings of sadness usually happen in the period right after you have your baby, and they  go away within a week or two. This is called the "baby blues." What are the causes? There is no known cause of baby blues. It is likely caused by a combination of factors. However, changes in hormone levels after childbirth are believed to trigger some of the symptoms. Other factors that can play a role in these mood changes include:  Lack of sleep.  Stressful life events, such as poverty, caring for a loved one, or death of a loved one.  Genetics. What are the signs or symptoms? Symptoms of this condition include:  Brief changes in mood, such as going from extreme happiness to sadness.  Decreased concentration.  Difficulty sleeping.  Crying spells and tearfulness.  Loss of appetite.  Irritability.  Anxiety. If the symptoms of baby blues last for more than 2 weeks or become more severe, you may have postpartum depression. How is this diagnosed? This condition is diagnosed based on an evaluation of your symptoms. There are no medical or lab tests that lead to a diagnosis, but there are various questionnaires that a health care provider may use to identify women with the baby blues or postpartum depression. How is this treated? Treatment is not needed for this condition. The baby blues usually go away on their own in 1-2  weeks. Social support is often all that is needed. You will be encouraged to get adequate sleep and rest. Follow these instructions at home: Lifestyle      Get as much rest as you can. Take a nap when the baby sleeps.  Exercise regularly as told by your health care provider. Some women find yoga and walking to be helpful.  Eat a balanced and nourishing diet. This includes plenty of fruits and vegetables, whole grains, and lean proteins.  Do little things that you enjoy. Have a cup of tea, take a bubble bath, read your favorite magazine, or listen to your favorite music.  Avoid alcohol.  Ask for help with household chores, cooking, grocery shopping, or  running errands. Do not try to do everything yourself. Consider hiring a postpartum doula to help. This is a professional who specializes in providing support to new mothers.  Try not to make any major life changes during pregnancy or right after giving birth. This can add stress. General instructions  Talk to people close to you about how you are feeling. Get support from your partner, family members, friends, or other new moms. You may want to join a support group.  Find ways to cope with stress. This may include: ? Writing your thoughts and feelings in a journal. ? Spending time outside. ? Spending time with people who make you laugh.  Try to stay positive in how you think. Think about the things you are grateful for.  Take over-the-counter and prescription medicines only as told by your health care provider.  Let your health care provider know if you have any concerns.  Keep all postpartum visits as told by your health care provider. This is important. Contact a health care provider if:  Your baby blues do not go away after 2 weeks. Get help right away if:  You have thoughts of taking your own life (suicidal thoughts).  You think you may harm the baby or other people.  You see or hear things that are not there (hallucinations). Summary  After giving birth, you may feel happy one minute and sad or stressed the next. Feelings of sadness that happen right after the baby is born and go away after a week or two are called the "baby blues."  You can manage the baby blues by getting enough rest, eating a healthy diet, exercising, spending time with supportive people, and finding ways to cope with stress.  If feelings of sadness and stress last longer than 2 weeks or get in the way of caring for your baby, talk to your health care provider. This may mean you have postpartum depression. This information is not intended to replace advice given to you by your health care provider. Make  sure you discuss any questions you have with your health care provider. Document Released: 11/08/2003 Document Revised: 05/28/2018 Document Reviewed: 04/01/2016 Elsevier Patient Education  2020 [employer].

## 2019-01-06 ENCOUNTER — Other Ambulatory Visit: Payer: Self-pay

## 2019-01-06 ENCOUNTER — Ambulatory Visit (INDEPENDENT_AMBULATORY_CARE_PROVIDER_SITE_OTHER): Payer: Medicaid Other | Admitting: Clinical

## 2019-01-06 DIAGNOSIS — F321 Major depressive disorder, single episode, moderate: Secondary | ICD-10-CM | POA: Diagnosis not present

## 2019-01-06 NOTE — BH Specialist Note (Signed)
Integrated Behavioral Health via Telemedicine Video Visit  01/06/2019 Billijo Dilling [id number]  Number of Integrated Behavioral Health visits: 1 Session Start time: 3:50  Session End time: 4:36 Total time: 46 minutes  Referring Provider: Raelyn Mora, CNM Type of Visit: Video Patient/Family location: Home [facility] Provider location: [facility] All persons participating in visit: Patient Krystal Hudson and [facility] Kathalene Sporer    Confirmed patient's address: Yes  Confirmed patient's phone number: Yes  Any changes to demographics: No   Confirmed patient's insurance: Yes  Any changes to patient's insurance: No   Discussed confidentiality: Yes   I connected with [name]  by a video enabled telemedicine application and verified that I am speaking with the correct person using two identifiers.     I discussed the limitations of evaluation and management by telemedicine and the availability of in person appointments.  I discussed that the purpose of this visit is to provide behavioral health care while limiting exposure to the novel coronavirus.   Discussed there is a possibility of technology failure and discussed alternative modes of communication if that failure occurs.  I discussed that engaging in this video visit, they consent to the provision of behavioral healthcare and the services will be billed under their insurance.  Patient and/or legal guardian expressed understanding and consented to video visit: Yes   PRESENTING CONCERNS: Patient and/or family reports the following symptoms/concerns: Pt states her primary concern is feeling depressed, exhausted, anxious, with poor appetite and poor sleep quality. Pt began feeling depressed about 3 months ago, was sleeping well in pregnancy, but difficulty adjusting to baby's sleep schedule.  Duration of problem: Began 3 months ago; Severity of problem: moderate  STRENGTHS (Protective Factors/Coping Skills): Some coping skills  GOALS  ADDRESSED: Patient will: 1.  Reduce symptoms of: anxiety, depression and insomnia  2.  Increase knowledge and/or ability of: coping skills and healthy habits  3.  Demonstrate ability to: Increase healthy adjustment to current life circumstances and Increase motivation to adhere to plan of care  INTERVENTIONS: Interventions utilized:  Mindfulness or Management consultant, Sleep Hygiene and Psychoeducation and/or Health Education Standardized Assessments completed: GAD-7 and PHQ 9  ASSESSMENT: Patient currently experiencing Current major depressive disorder, moderate  Patient may benefit from psychoeducation and brief therapeutic interventions regarding coping with symptoms of depression and anxiety .  PLAN: 1. Follow up with behavioral health clinician on : Two weeks 2. Behavioral recommendations:  -CALM relaxation breathing exercise twice daily(morning; evening) -After morning breathing exercise, listen to chosen "happy" songs daily  3. Referral(s): Integrated [employer] (In Clinic)  I discussed the assessment and treatment plan with the patient and/or parent/guardian. They were provided an opportunity to ask questions and all were answered. They agreed with the plan and demonstrated an understanding of the instructions.   They were advised to call back or seek an in-person evaluation if the symptoms worsen or if the condition fails to improve as anticipated.  Valetta Close Latora Quarry   Depression screen [facility] 2/9 01/06/2019 08/25/2018  Decreased Interest 3 0  Down, Depressed, Hopeless 3 0  PHQ - 2 Score 6 0  Altered sleeping 3 3  Tired, decreased energy 3 3  Change in appetite 3 2  Feeling bad or failure about yourself  3 0  Trouble concentrating 1 0  Moving slowly or fidgety/restless 3 0  Suicidal thoughts 0 0  PHQ-9 Score 22 8  Difficult doing work/chores - Not difficult at all   GAD 7 : Generalized Anxiety Score  01/06/2019 08/25/2018  Nervous, Anxious, on Edge 3 1   Control/stop worrying 1 0  Worry too much - different things 3 0  Trouble relaxing 1 0  Restless 3 1  Easily annoyed or irritable 1 1  Afraid - awful might happen 1 0  Total GAD 7 Score 13 3  Anxiety Difficulty - Not difficult at all

## 2019-01-08 ENCOUNTER — Other Ambulatory Visit: Payer: Self-pay | Admitting: Obstetrics and Gynecology

## 2019-01-08 DIAGNOSIS — D509 Iron deficiency anemia, unspecified: Secondary | ICD-10-CM

## 2019-01-08 DIAGNOSIS — G4709 Other insomnia: Secondary | ICD-10-CM

## 2019-01-08 MED ORDER — FERROUS SULFATE 325 (65 FE) MG PO TABS: 325.0000 mg | ORAL_TABLET | Freq: Two times a day (BID) | ORAL | 3 refills | Status: DC

## 2019-01-08 MED ORDER — MAGNESIUM 200 MG PO TABS: 400.0000 mg | ORAL_TABLET | Freq: Every day | ORAL | 1 refills | Status: DC

## 2019-01-08 NOTE — Progress Notes (Signed)
Rx for FeSO4 increased to BID d/t patient's complaint of fatigue.  Laury Deep, CNM

## 2019-01-20 ENCOUNTER — Ambulatory Visit (INDEPENDENT_AMBULATORY_CARE_PROVIDER_SITE_OTHER): Payer: Medicaid Other | Admitting: Clinical

## 2019-01-20 ENCOUNTER — Other Ambulatory Visit: Payer: Self-pay

## 2019-01-20 DIAGNOSIS — F321 Major depressive disorder, single episode, moderate: Secondary | ICD-10-CM

## 2019-01-20 NOTE — BH Specialist Note (Signed)
Integrated Behavioral Health via Telemedicine Video Visit  01/20/2019 Krystal Hudson [id number]  Number of Integrated Behavioral Health visits: 2 Session Start time: 3:19  Session End time: 3:50 Total time: 31  Referring Provider: Raelyn Mora, CNM Type of Visit: Video Patient/Family location: Home [facility] Provider location: [facility] All persons participating in visit: Patient Krystal Hudson and [facility] Sudiksha Victor    Confirmed patient's address: Yes  Confirmed patient's phone number: Yes  Any changes to demographics: No   Confirmed patient's insurance: Yes  Any changes to patient's insurance: No   Discussed confidentiality: At previous visit  I connected with Krystal Hudson by a video enabled telemedicine application and verified that I am speaking with the correct person using two identifiers.     I discussed the limitations of evaluation and management by telemedicine and the availability of in person appointments.  I discussed that the purpose of this visit is to provide behavioral health care while limiting exposure to the novel coronavirus.   Discussed there is a possibility of technology failure and discussed alternative modes of communication if that failure occurs.  I discussed that engaging in this video visit, they consent to the provision of behavioral healthcare and the services will be billed under their insurance.  Patient and/or legal guardian expressed understanding and consented to video visit: Yes   PRESENTING CONCERNS: Patient and/or family reports the following symptoms/concerns: Pt states that she is feeling better as her sleep is improving, but she notices that she is still worrying at night, preventing her from getting a better quality of sleep; open to learning an additional coping strategy. Duration of problem: Over 3 months; Severity of problem: moderate  STRENGTHS (Protective Factors/Coping Skills): Using breathing exercises daily; good social  support  GOALS ADDRESSED: Patient will: 1.  Reduce symptoms of: anxiety, depression and insomnia  2.  Increase knowledge and/or ability of: coping skills  3.  Demonstrate ability to: Increase healthy adjustment to current life circumstances and Increase adequate support systems for patient/family  INTERVENTIONS: Interventions utilized:  Brief CBT and Link to [employer] Standardized Assessments completed: GAD-7 and PHQ 9  ASSESSMENT: Patient currently experiencing Current major depressive disorder, moderate.   Patient may benefit from continued brief therapeutic interventions.  PLAN: 1. Follow up with behavioral health clinician on : Two weeks 2. Behavioral recommendations:  -Begin Worry Time strategy tomorrow -Consider registering for and attending at least one Mom Talk support group online -Continue self-coping stategies daily (breathing exercises, music) -Continue to take medications as prescribed (including iron and magnesium) 3. Referral(s): Integrated [employer] (In Clinic) and [employer]:  Mom Talk  I discussed the assessment and treatment plan with the patient and/or parent/guardian. They were provided an opportunity to ask questions and all were answered. They agreed with the plan and demonstrated an understanding of the instructions.   They were advised to call back or seek an in-person evaluation if the symptoms worsen or if the condition fails to improve as anticipated.  Krystal Hudson  Depression screen [facility] 2/9 01/20/2019 01/06/2019 08/25/2018  Decreased Interest 0 3 0  Down, Depressed, Hopeless 1 3 0  PHQ - 2 Score 1 6 0  Altered sleeping 3 3 3   Tired, decreased energy 3 3 3   Change in appetite 3 3 2   Feeling bad or failure about yourself  1 3 0  Trouble concentrating 0 1 0  Moving slowly or fidgety/restless 1 3 0  Suicidal thoughts 0 0 0  PHQ-9 Score 12 22  8  Difficult doing work/chores - - Not difficult at all   GAD 7 :  Generalized Anxiety Score 01/20/2019 01/06/2019 08/25/2018  Nervous, Anxious, on Edge 0 3 1  Control/stop worrying 1 1 0  Worry too much - different things 1 3 0  Trouble relaxing 0 1 0  Restless 0 3 1  Easily annoyed or irritable 1 1 1   Afraid - awful might happen 0 1 0  Total GAD 7 Score 3 13 3   Anxiety Difficulty - - Not difficult at all

## 2019-01-27 NOTE — BH Specialist Note (Signed)
Pt did not arrive to video visit and did not answer the phone ; Left HIPPA-compliant message to call back [name] from [facility] at [phone].  ; left MyChart message for patient.    [name] via Telemedicine Video Visit  01/27/2019 Krystal Hudson [id number]  [name]

## 2019-02-02 ENCOUNTER — Ambulatory Visit: Payer: Medicaid Other | Admitting: Clinical

## 2019-02-02 ENCOUNTER — Other Ambulatory Visit: Payer: Self-pay

## 2019-02-02 DIAGNOSIS — Z91199 Patient's noncompliance with other medical treatment and regimen due to unspecified reason: Secondary | ICD-10-CM

## 2019-02-02 DIAGNOSIS — Z5329 Procedure and treatment not carried out because of patient's decision for other reasons: Secondary | ICD-10-CM

## 2019-03-07 ENCOUNTER — Encounter (HOSPITAL_COMMUNITY): Payer: Self-pay | Admitting: Obstetrics & Gynecology

## 2019-03-10 ENCOUNTER — Ambulatory Visit: Payer: Medicaid Other | Admitting: Obstetrics and Gynecology

## 2019-03-17 ENCOUNTER — Other Ambulatory Visit: Payer: Self-pay

## 2019-03-17 ENCOUNTER — Encounter: Payer: Self-pay | Admitting: General Practice

## 2019-03-17 ENCOUNTER — Ambulatory Visit: Payer: Medicaid Other | Admitting: Obstetrics and Gynecology

## 2019-03-17 VITALS — BP 127/78 | HR 84 | Temp 98.0°F | Wt 277.0 lb

## 2019-03-17 DIAGNOSIS — N939 Abnormal uterine and vaginal bleeding, unspecified: Secondary | ICD-10-CM

## 2019-03-17 DIAGNOSIS — Z30432 Encounter for removal of intrauterine contraceptive device: Secondary | ICD-10-CM

## 2019-03-17 NOTE — Progress Notes (Signed)
   IUD Removal Procedure Note   Patient is 24 y.o. G1P1001 who is here for IUD removal. She would like IUD removed secondary to heavy vaginal bleeding and "random" hip pain. She has had these issues with IUD since it was inserted. She understands that she could get pregnant after removal of IUD if she does not use another form of contraception. She has no other complaints today. Reviewed risks of removal including pain, bleeding, difficult removal and inability to remove IUD which may require surgical removal in OR. She affirms that she would like IUD removed.  Picture #1 immediately following removal:   Picture #2 string measurement:   BP 127/78 (BP Location: Right Arm, Patient Position: Sitting, Cuff Size: Large)   Pulse 84   Temp 98 F (36.7 C) (Oral)   Wt 277 lb (125.6 kg)   BMI 47.55 kg/m   Patient with normal appearing external female genitalia. Graves speculum placed in vagina and a loop of Liletta IUD strings easily visualized, but does not appear to be the end of the strings. Strings grasped with ring forceps and removed easily. IUD with what appears to be an untrimmed string coiled around the body of the IUD. The string measured 35 cm. See inserted photos. Minimal bleeding noted. All instruments removed from vagina. Patient tolerated procedure very well.    She was given post removal instructions. She is planning on using condoms for contraception.   Return 1 year for annual or prn.  Raelyn Mora, MSN, CNM 03/17/2019 3:21 PM

## 2020-05-31 IMAGING — US US MFM OB FOLLOW-UP
1 series · 14 of 28 positions shown · non-contrast
Comparison: none

[Series 1: us mfm ob follow-up · 42 acquisitions, 14 frames shown]
[im 2/42]
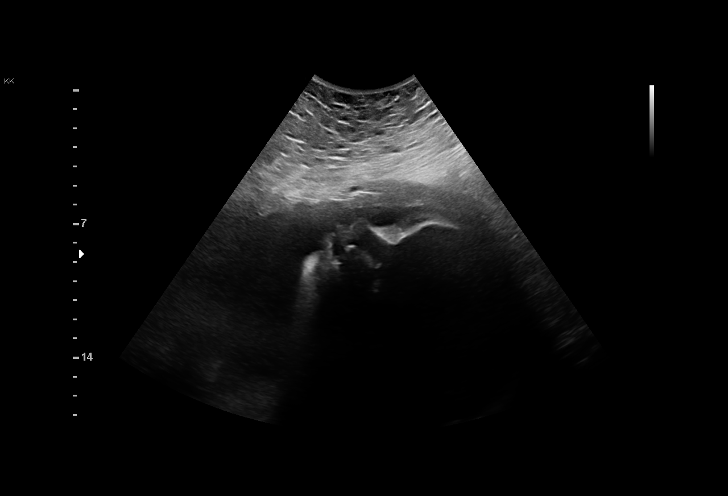
[im 5/42]
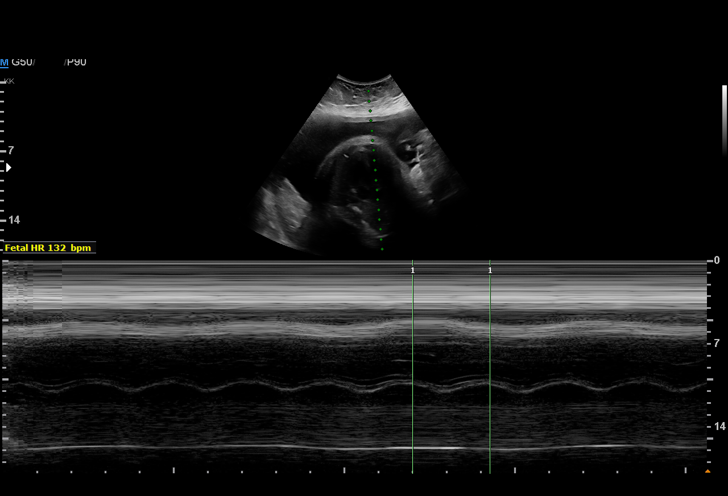
[im 8/42]
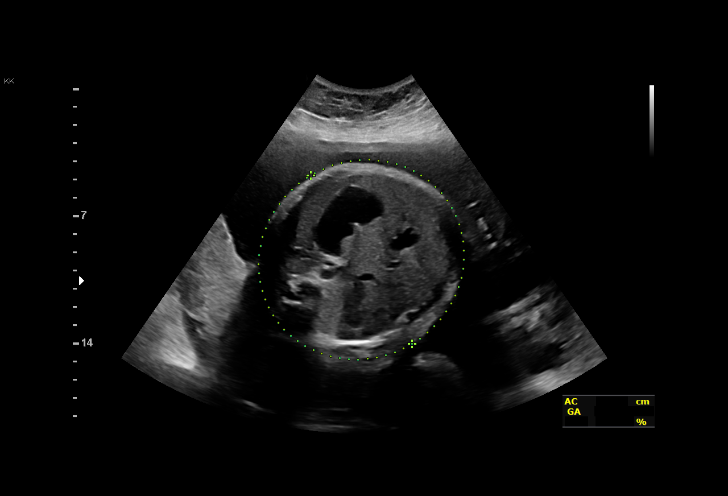
[im 11/42]
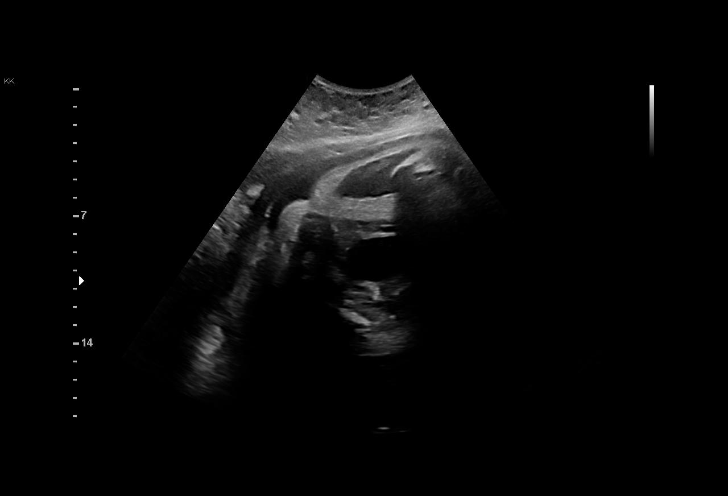
[im 14/42]
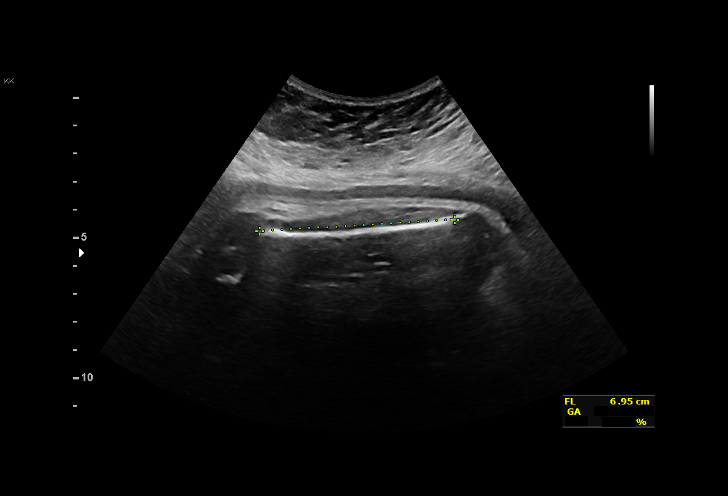
[im 17/42]
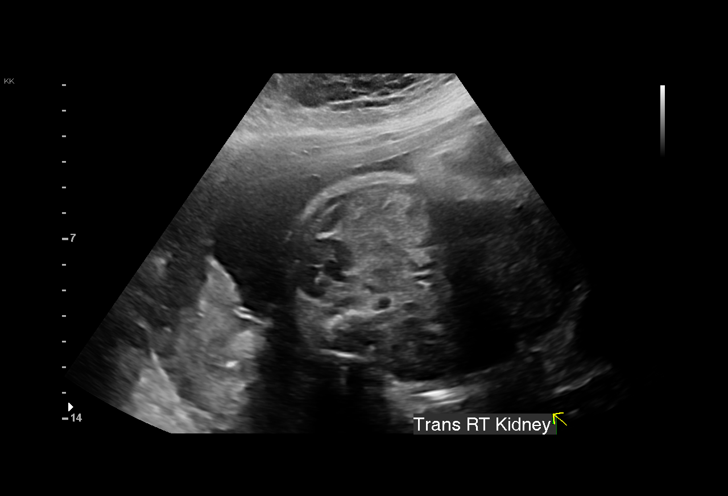
[im 20/42]
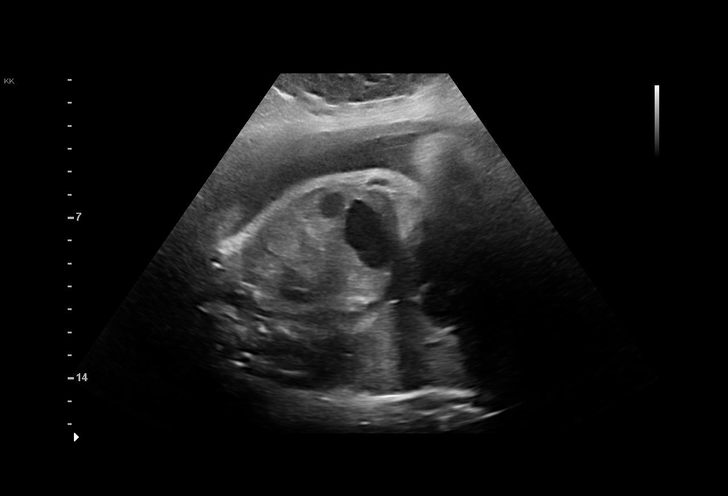
[im 23/42]
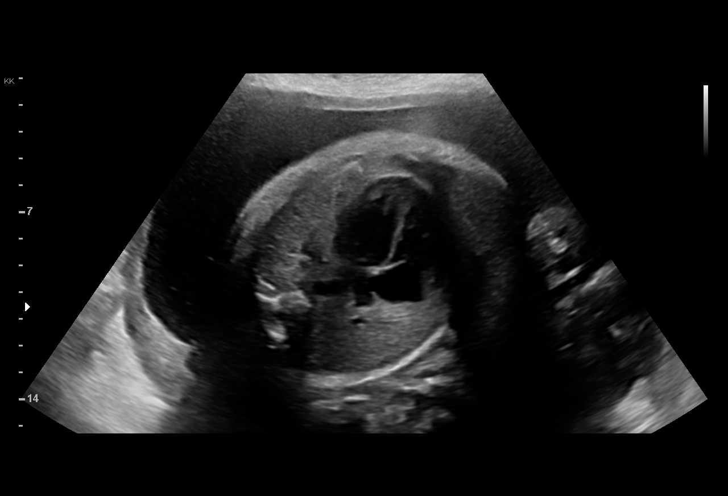
[im 26/42]
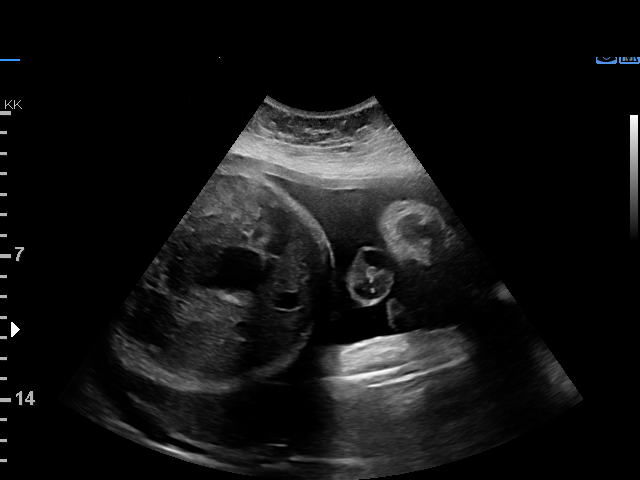
[im 29/42]
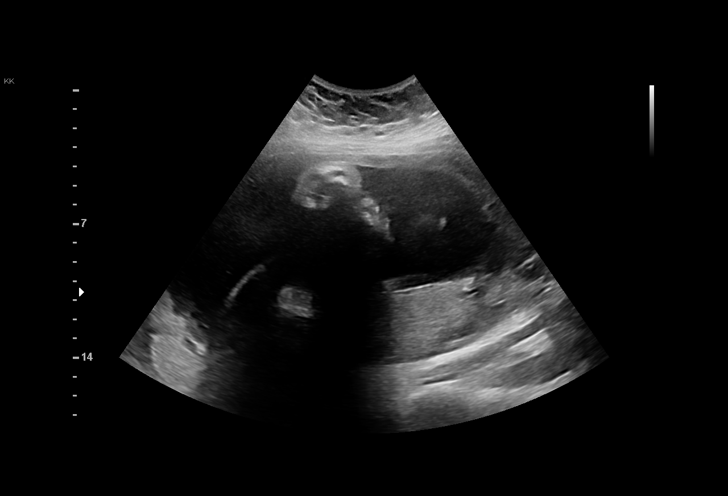
[im 32/42]
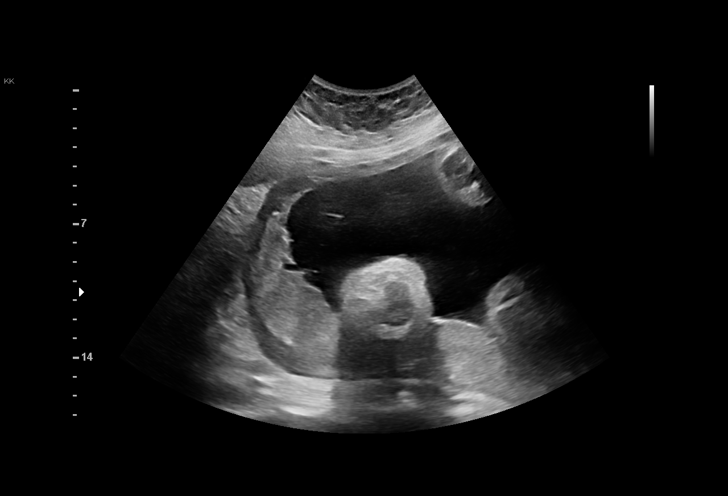
[im 35/42]
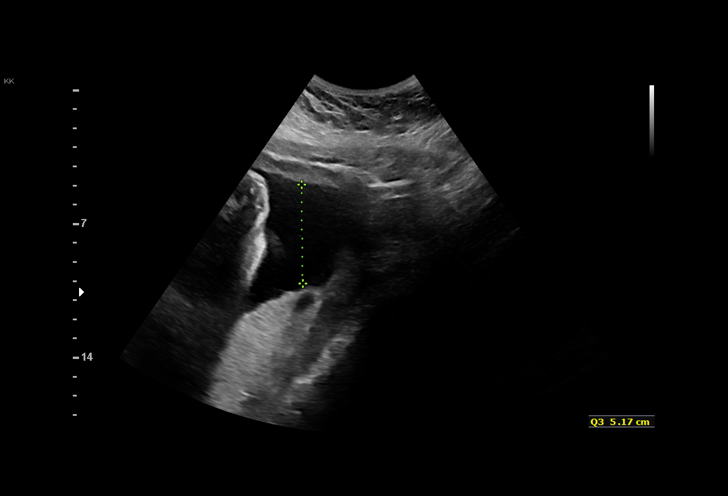
[im 38/42]
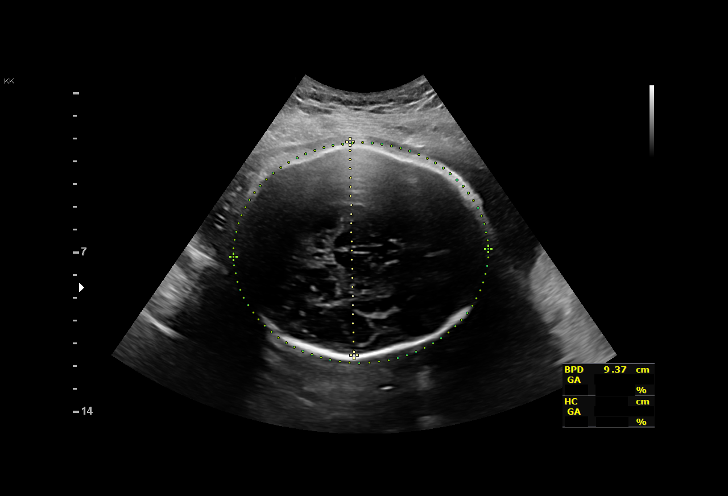
[im 42/42]
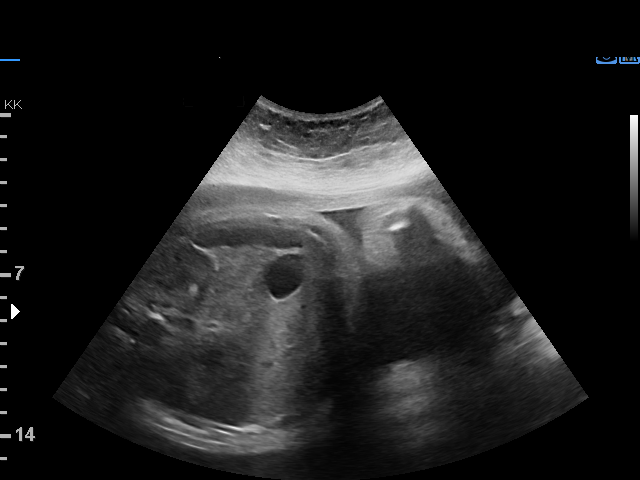

[14 of 28 positions shown; findings below may reference images not displayed]

RUDI
 ----------------------------------------------------------------------

 ----------------------------------------------------------------------
Indications

  35 weeks gestation of pregnancy
  Obesity complicating pregnancy, second
  trimester (BMI 52)
  Genetic carrier (silent Bencinska Bricl)
  Encounter for other antenatal screening
  follow-up
 ----------------------------------------------------------------------
Vital Signs

 BMI:
Fetal Evaluation

 Num Of Fetuses:          1
 Fetal Heart Rate(bpm):   132
 Cardiac Activity:        Observed
 Presentation:            Cephalic
 Placenta:                Posterior
 P. Cord Insertion:       Previously Visualized

 Amniotic Fluid
 AFI FV:      Within normal limits

 AFI Sum(cm)     %Tile       Largest Pocket(cm)
 15.52           57

 RUQ(cm)       RLQ(cm)       LUQ(cm)        LLQ(cm)

Biometry

 BPD:      93.5  mm     G. Age:  38w 0d         98  %    CI:          81.1  %    70 - 86
                                                         FL/HC:       21.2  %    20.1 -
 HC:      327.8  mm     G. Age:  37w 2d         59  %    HC/AC:       0.94       0.93 -
 AC:      349.7  mm     G. Age:  38w 6d       > 99  %    FL/BPD:      74.3  %    71 - 87
 FL:       69.5  mm     G. Age:  35w 5d         46  %    FL/AC:       19.9  %    20 - 24

 Est. FW:    2224   gm     7 lb 6 oz     96  %
OB History

 Gravidity:    1
Gestational Age

 LMP:           35w 4d        Date:  02/14/18                 EDD:   11/21/18
 U/S Today:     37w 3d                                        EDD:   11/08/18
 Best:          35w 4d     Det. By:  LMP  (02/14/18)          EDD:   11/21/18
Anatomy

 Cranium:               Appears normal         Aortic Arch:            Previously seen
 Cavum:                 Appears normal         Ductal Arch:            Not well visualized
 Ventricles:            Appears normal         Diaphragm:              Previously seen
 Choroid Plexus:        Previously seen        Stomach:                Appears normal, left
                                                                       sided
 Cerebellum:            Previously seen        Abdomen:                Previously seen
 Posterior Fossa:       Previously seen        Abdominal Wall:         Previously seen
 Nuchal Fold:           Previously seen        Cord Vessels:           Previously seen
 Face:                  Orbits and profile     Kidneys:                Appear normal
                        previously seen
 Lips:                  Previously seen        Bladder:                Appears normal
 Thoracic:              Appears normal         Spine:                  Previously seen
 Heart:                 Previously seen        Upper Extremities:      Previously seen
 RVOT:                  Previously seen        Lower Extremities:      Previously seen
 LVOT:                  Previously seen

 Other:  Feet previously visualized. Technically difficult due to maternal
         habitus and fetal position.
Cervix Uterus Adnexa

 Cervix
 Not visualized (advanced GA >57wks)
Impression

 Normal interval growth.
 BMI >50
 AC 99th
 Good fetal movement and amniotic fluid
Recommendations

 Follow up growth as clinically indicated

## 2021-01-23 ENCOUNTER — Encounter (HOSPITAL_COMMUNITY): Payer: Self-pay

## 2021-01-23 ENCOUNTER — Emergency Department (HOSPITAL_COMMUNITY): Payer: 59

## 2021-01-23 ENCOUNTER — Emergency Department (HOSPITAL_COMMUNITY)
Admission: EM | Admit: 2021-01-23 | Discharge: 2021-01-23 | Disposition: A | Discharge status: Home / Self Care | Payer: 59 | Attending: Emergency Medicine | Admitting: Emergency Medicine

## 2021-01-23 DIAGNOSIS — R102 Pelvic and perineal pain: Secondary | ICD-10-CM | POA: Diagnosis present

## 2021-01-23 DIAGNOSIS — N898 Other specified noninflammatory disorders of vagina: Secondary | ICD-10-CM | POA: Diagnosis not present

## 2021-01-23 DIAGNOSIS — R0789 Other chest pain: Secondary | ICD-10-CM | POA: Insufficient documentation

## 2021-01-23 DIAGNOSIS — R110 Nausea: Secondary | ICD-10-CM | POA: Insufficient documentation

## 2021-01-23 DIAGNOSIS — Z5321 Procedure and treatment not carried out due to patient leaving prior to being seen by health care provider: Secondary | ICD-10-CM | POA: Diagnosis not present

## 2021-01-23 LAB — COMPREHENSIVE METABOLIC PANEL
ALT: 35 U/L (ref 0–44)
AST: 28 U/L (ref 15–41)
Albumin: 3.4 g/dL — ABNORMAL LOW (ref 3.5–5.0)
Alkaline Phosphatase: 61 U/L (ref 38–126)
Anion gap: 8 (ref 5–15)
BUN: 6 mg/dL (ref 6–20)
CO2: 22 mmol/L (ref 22–32)
Calcium: 8.6 mg/dL — ABNORMAL LOW (ref 8.9–10.3)
Chloride: 106 mmol/L (ref 98–111)
Creatinine, Ser: 0.73 mg/dL (ref 0.44–1.00)
GFR, Estimated: >60 mL/min (ref >60)
Glucose, Bld: 110 mg/dL — ABNORMAL HIGH (ref 70–99)
Potassium: 3.5 mmol/L (ref 3.5–5.1)
Sodium: 136 mmol/L (ref 135–145)
Total Bilirubin: 0.2 mg/dL — ABNORMAL LOW (ref 0.3–1.2)
Total Protein: 6.3 g/dL — ABNORMAL LOW (ref 6.5–8.1)

## 2021-01-23 LAB — CBC WITH DIFFERENTIAL/PLATELET
Abs Immature Granulocytes: 0.03 10*3/uL (ref 0.00–0.07)
Basophils Absolute: 0 10*3/uL (ref 0.0–0.1)
Basophils Relative: 1 %
Eosinophils Absolute: 0 10*3/uL (ref 0.0–0.5)
Eosinophils Relative: 0 %
HCT: 37.9 % (ref 36.0–46.0)
Hemoglobin: 12 g/dL (ref 12.0–15.0)
Immature Granulocytes: 1 %
Lymphocytes Relative: 36 %
Lymphs Abs: 2.3 10*3/uL (ref 0.7–4.0)
MCH: 26.5 pg (ref 26.0–34.0)
MCHC: 31.7 g/dL (ref 30.0–36.0)
MCV: 83.7 fL (ref 80.0–100.0)
Monocytes Absolute: 0.5 10*3/uL (ref 0.1–1.0)
Monocytes Relative: 7 %
Neutro Abs: 3.6 10*3/uL (ref 1.7–7.7)
Neutrophils Relative %: 55 %
Platelets: 282 10*3/uL (ref 150–400)
RBC: 4.53 MIL/uL (ref 3.87–5.11)
RDW: 14.9 % (ref 11.5–15.5)
WBC: 6.4 10*3/uL (ref 4.0–10.5)
nRBC: 0 % (ref 0.0–0.2)

## 2021-01-23 LAB — URINALYSIS, ROUTINE W REFLEX MICROSCOPIC
Bilirubin Urine: NEGATIVE
Glucose, UA: NEGATIVE mg/dL
Hgb urine dipstick: NEGATIVE
Ketones, ur: 40 mg/dL — AB
Leukocytes,Ua: NEGATIVE
Nitrite: NEGATIVE
Protein, ur: NEGATIVE mg/dL
Specific Gravity, Urine: >1.03 — ABNORMAL HIGH (ref 1.005–1.030)
pH: 6 (ref 5.0–8.0)

## 2021-01-23 LAB — I-STAT BETA HCG BLOOD, ED (MC, WL, AP ONLY): I-stat hCG, quantitative: 875.2 m[IU]/mL — ABNORMAL HIGH (ref <5)

## 2021-01-23 LAB — TROPONIN I (HIGH SENSITIVITY)
Troponin I (High Sensitivity): 3 ng/L (ref <18)
Troponin I (High Sensitivity): <2 ng/L (ref <18)

## 2021-01-23 LAB — D-DIMER, QUANTITATIVE: D-Dimer, Quant: 0.49 ug/mL-FEU (ref 0.00–0.50)

## 2021-01-23 NOTE — ED Triage Notes (Signed)
Pt arrives POV for eval of R sided chest pain x 2 days. Reports also blt LQ pelvic pain w/ some abnormal discharge. Pt reports unknown preg status, LMP first week of November. Pt also reports L foot numbness x 2 days. No weakness, no foot drop.

## 2021-01-23 NOTE — ED Provider Notes (Signed)
Emergency Medicine Provider Triage Evaluation Note  Krystal Hudson , a 25 y.o. female  was evaluated in triage.  Pt complains of gradual onset, constant, achy, bilateral pelvic pain for the past 2 weeks as well as nausea only in the mornings.  She also complains of right-sided chest wall pain which is worse with movement and palpation.  She denies any heavy lifting or shortness of breath.  She does report her last normal menstrual cycle was on November 1 she is unsure if she could be pregnant.  She does have some abnormal vaginal discharge as well.  Review of Systems  Positive: + CP, pelvic pain, vaginal discharge Negative: - vaginal bleeding, SOB  Physical Exam  BP 119/78 (BP Location: Left Arm)   Pulse (!) 122   Temp 98.8 F (37.1 C) (Oral)   Resp 18   Ht 5\' 4"  (1.626 m)   Wt 126 kg   SpO2 99%   BMI 47.68 kg/m  Gen:   Awake, no distress   Resp:  Normal effort  MSK:   Moves extremities without difficulty  Other:    Medical Decision Making  Medically screening exam initiated at 7:22 PM.  Appropriate orders placed.  Regene Mccarthy was informed that the remainder of the evaluation will be completed by another provider, this initial triage assessment does not replace that evaluation, and the importance of remaining in the ED until their evaluation is complete.     [name], PA-C 01/23/21 1923    14/07/22, MD 01/23/21 2136

## 2021-01-24 ENCOUNTER — Telehealth: Payer: Self-pay

## 2021-01-24 NOTE — Telephone Encounter (Signed)
Transition Care Management Unsuccessful Follow-up Telephone Call  Date of discharge and from where:  01/23/2021 from [facility]  Attempts:  1st Attempt  Reason for unsuccessful TCM follow-up call:  Left voice message

## 2021-01-25 NOTE — Telephone Encounter (Signed)
Transition Care Management Unsuccessful Follow-up Telephone Call  Date of discharge and from where:  01/23/2021 [place]  Attempts:  2nd Attempt  Reason for unsuccessful TCM follow-up call:  Left voice message    

## 2021-01-27 ENCOUNTER — Encounter (HOSPITAL_COMMUNITY): Payer: Self-pay | Admitting: Obstetrics and Gynecology

## 2021-01-27 ENCOUNTER — Encounter: Payer: Self-pay | Admitting: *Deleted

## 2021-01-27 ENCOUNTER — Inpatient Hospital Stay (HOSPITAL_COMMUNITY): Payer: 59

## 2021-01-27 ENCOUNTER — Ambulatory Visit
Admission: EM | Admit: 2021-01-27 | Discharge: 2021-01-27 | Disposition: A | Discharge status: Nursing Home (Includes ICF) | Payer: 59

## 2021-01-27 ENCOUNTER — Other Ambulatory Visit: Payer: Self-pay

## 2021-01-27 ENCOUNTER — Inpatient Hospital Stay (HOSPITAL_COMMUNITY)
Admission: AD | Admit: 2021-01-27 | Discharge: 2021-01-27 | Disposition: A | Discharge status: Home / Self Care | Payer: 59 | Attending: Obstetrics and Gynecology | Admitting: Obstetrics and Gynecology

## 2021-01-27 DIAGNOSIS — R079 Chest pain, unspecified: Secondary | ICD-10-CM | POA: Diagnosis not present

## 2021-01-27 DIAGNOSIS — O26891 Other specified pregnancy related conditions, first trimester: Secondary | ICD-10-CM | POA: Diagnosis not present

## 2021-01-27 DIAGNOSIS — Z3201 Encounter for pregnancy test, result positive: Secondary | ICD-10-CM

## 2021-01-27 DIAGNOSIS — Z3490 Encounter for supervision of normal pregnancy, unspecified, unspecified trimester: Secondary | ICD-10-CM

## 2021-01-27 DIAGNOSIS — Z87891 Personal history of nicotine dependence: Secondary | ICD-10-CM | POA: Insufficient documentation

## 2021-01-27 DIAGNOSIS — R109 Unspecified abdominal pain: Secondary | ICD-10-CM | POA: Insufficient documentation

## 2021-01-27 DIAGNOSIS — M545 Low back pain, unspecified: Secondary | ICD-10-CM | POA: Diagnosis not present

## 2021-01-27 DIAGNOSIS — D649 Anemia, unspecified: Secondary | ICD-10-CM | POA: Diagnosis not present

## 2021-01-27 DIAGNOSIS — O99011 Anemia complicating pregnancy, first trimester: Secondary | ICD-10-CM | POA: Insufficient documentation

## 2021-01-27 DIAGNOSIS — R519 Headache, unspecified: Secondary | ICD-10-CM | POA: Diagnosis not present

## 2021-01-27 DIAGNOSIS — O26899 Other specified pregnancy related conditions, unspecified trimester: Secondary | ICD-10-CM

## 2021-01-27 DIAGNOSIS — Z3A01 Less than 8 weeks gestation of pregnancy: Secondary | ICD-10-CM

## 2021-01-27 DIAGNOSIS — R200 Anesthesia of skin: Secondary | ICD-10-CM | POA: Insufficient documentation

## 2021-01-27 DIAGNOSIS — R42 Dizziness and giddiness: Secondary | ICD-10-CM | POA: Diagnosis not present

## 2021-01-27 LAB — URINALYSIS, ROUTINE W REFLEX MICROSCOPIC
Bilirubin Urine: NEGATIVE
Glucose, UA: NEGATIVE mg/dL
Hgb urine dipstick: NEGATIVE
Ketones, ur: 20 mg/dL — AB
Leukocytes,Ua: NEGATIVE
Nitrite: NEGATIVE
Protein, ur: NEGATIVE mg/dL
Specific Gravity, Urine: 1.021 (ref 1.005–1.030)
pH: 5 (ref 5.0–8.0)

## 2021-01-27 LAB — CBC
HCT: 34.1 % — ABNORMAL LOW (ref 36.0–46.0)
Hemoglobin: 10.9 g/dL — ABNORMAL LOW (ref 12.0–15.0)
MCH: 26.8 pg (ref 26.0–34.0)
MCHC: 32 g/dL (ref 30.0–36.0)
MCV: 83.8 fL (ref 80.0–100.0)
Platelets: 268 10*3/uL (ref 150–400)
RBC: 4.07 MIL/uL (ref 3.87–5.11)
RDW: 15.5 % (ref 11.5–15.5)
WBC: 5.6 10*3/uL (ref 4.0–10.5)
nRBC: 0 % (ref 0.0–0.2)

## 2021-01-27 LAB — WET PREP, GENITAL
Clue Cells Wet Prep HPF POC: NONE SEEN
Sperm: NONE SEEN
Trich, Wet Prep: NONE SEEN
WBC, Wet Prep HPF POC: <10 (ref <10)
Yeast Wet Prep HPF POC: NONE SEEN

## 2021-01-27 LAB — HCG, QUANTITATIVE, PREGNANCY: hCG, Beta Chain, Quant, S: 5152 m[IU]/mL — ABNORMAL HIGH (ref <5)

## 2021-01-27 NOTE — MAU Note (Signed)
+  HCG on 12/7 in ED, pt had left without getting results. Returned today int chest pain, numbness in her leg. (Per note- received no call from ED)

## 2021-01-27 NOTE — MAU Provider Note (Signed)
History     CSN: [id number]  Arrival date and time: 01/27/21 1137   Event Date/Time   First Provider Initiated Contact with Patient 01/27/21 1232      Chief Complaint  Patient presents with   Abdominal Pain   Back Pain   Dizziness   Headache   Numbness    To bilateral legs    25 y.o. G2P1001 @[redacted]w[redacted]d  by LMP sent from UC for CP and leg numbness. Reports CP started a few days ago, she was seen in ED and since has resolved. Reports bilateral, intermittent, leg numbness. Started 2 weeks ago. No recent injury. Reports lifting her son regularly who weighs 40 lbs. Since arrival to MAU she also reports LAP and LBP. LAP started 1 week ago. Describes as bilateral and rates 10/10. Has not treated the pain. Denies urinary sx other than frequency. LAP started 1 week ago. Describes as cramping and bilateral. Rates 8/10. Denies VB or discharge.    OB History     Gravida  2   Para  1   Term  1   Preterm      AB      Living  1      SAB      IAB      Ectopic      Multiple  0   Live Births  1           Past Medical History:  Diagnosis Date   Morbid obesity ([place])     Past Surgical History:  Procedure Laterality Date   CESAREAN SECTION N/A 11/20/2018   Procedure: CESAREAN SECTION;  Surgeon: Emily Filbert, MD;  Location: [facility];  Service: Obstetrics;  Laterality: N/A;   TYMPANOSTOMY TUBE PLACEMENT      Family History  Problem Relation Age of Onset   Healthy Mother     Social History   Tobacco Use   Smoking status: Never   Smokeless tobacco: Never  Vaping Use   Vaping Use: Former   Quit date: 04/17/2018  Substance Use Topics   Alcohol use: Never   Drug use: Never    Allergies: No Known Allergies  Medications Prior to Admission  Medication Sig Dispense Refill Last Dose   amLODipine (NORVASC) 10 MG tablet Take 1 tablet (10 mg total) by mouth daily. 30 tablet 1    ferrous sulfate 325 (65 FE) MG tablet Take 1 tablet (325 mg total) by mouth 2 (two) times  daily with a meal. 60 tablet 3    ibuprofen (ADVIL) 800 MG tablet Take 1 tablet (800 mg total) by mouth every 8 (eight) hours. 30 tablet 0    Magnesium 200 MG TABS Take 2 tablets (400 mg total) by mouth at bedtime. 30 tablet 1    Prenatal Vit-Fe Fumarate-FA (MULTIVITAMIN-PRENATAL) 27-0.8 MG TABS tablet Take 1 tablet by mouth daily at 12 noon.      senna-docusate (SENOKOT-S) 8.6-50 MG tablet Take 2 tablets by mouth daily. 30 tablet 0     Review of Systems  Constitutional:  Negative for chills and fever.  Respiratory:  Negative for shortness of breath.   Cardiovascular:  Negative for chest pain.  Gastrointestinal:  Positive for abdominal pain and nausea. Negative for constipation, diarrhea and vomiting.  Genitourinary:  Positive for frequency. Negative for dysuria, hematuria and urgency.  Physical Exam   Blood pressure (!) 121/58, pulse 95, temperature 98.5 F (36.9 C), temperature source Oral, resp. rate 18, height 5\' 4"  (1.626 m), weight 128.8  kg, last menstrual period 12/18/2020, SpO2 100 %.  Physical Exam Vitals and nursing note reviewed.  Constitutional:      General: She is not in acute distress.    Appearance: Normal appearance.  HENT:     Head: Normocephalic and atraumatic.  Cardiovascular:     Rate and Rhythm: Normal rate.  Pulmonary:     Effort: Pulmonary effort is normal. No respiratory distress.  Abdominal:     General: There is no distension.     Palpations: Abdomen is soft. There is no mass.     Tenderness: There is no abdominal tenderness. There is no right CVA tenderness, left CVA tenderness, guarding or rebound.     Hernia: No hernia is present.  Musculoskeletal:        General: Normal range of motion.     Cervical back: Normal and normal range of motion.     Thoracic back: Normal.     Lumbar back: Tenderness present.  Skin:    General: Skin is warm and dry.  Neurological:     General: No focal deficit present.     Mental Status: She is alert and oriented to  person, place, and time.     Sensory: No sensory deficit.     Motor: No weakness.  Psychiatric:        Mood and Affect: Mood normal.        Behavior: Behavior normal.   Results for orders placed or performed during the hospital encounter of 01/27/21 (from the past 24 hour(s))  Urinalysis, Routine w reflex microscopic Urine, Clean Catch     Status: Abnormal   Collection Time: 01/27/21 12:24 PM  Result Value Ref Range   Color, Urine YELLOW YELLOW   APPearance HAZY (A) CLEAR   Specific Gravity, Urine 1.021 1.005 - 1.030   pH 5.0 5.0 - 8.0   Glucose, UA NEGATIVE NEGATIVE mg/dL   Hgb urine dipstick NEGATIVE NEGATIVE   Bilirubin Urine NEGATIVE NEGATIVE   Ketones, ur 20 (A) NEGATIVE mg/dL   Protein, ur NEGATIVE NEGATIVE mg/dL   Nitrite NEGATIVE NEGATIVE   Leukocytes,Ua NEGATIVE NEGATIVE  Wet prep, genital     Status: None   Collection Time: 01/27/21 12:32 PM  Result Value Ref Range   Yeast Wet Prep HPF POC NONE SEEN NONE SEEN   Trich, Wet Prep NONE SEEN NONE SEEN   Clue Cells Wet Prep HPF POC NONE SEEN NONE SEEN   WBC, Wet Prep HPF POC <10 <10   Sperm NONE SEEN   CBC     Status: Abnormal   Collection Time: 01/27/21 12:41 PM  Result Value Ref Range   WBC 5.6 4.0 - 10.5 K/uL   RBC 4.07 3.87 - 5.11 MIL/uL   Hemoglobin 10.9 (L) 12.0 - 15.0 g/dL   HCT 34.1 (L) 36.0 - 46.0 %   MCV 83.8 80.0 - 100.0 fL   MCH 26.8 26.0 - 34.0 pg   MCHC 32.0 30.0 - 36.0 g/dL   RDW 15.5 11.5 - 15.5 %   Platelets 268 150 - 400 K/uL   nRBC 0.0 0.0 - 0.2 %  hCG, quantitative, pregnancy     Status: Abnormal   Collection Time: 01/27/21 12:41 PM  Result Value Ref Range   hCG, Beta Chain, Quant, S 5,152 (H) <5 mIU/mL   US OB LESS THAN 14 WEEKS WITH OB TRANSVAGINAL  Result Date: 01/27/2021 CLINICAL DATA:  25 year old pregnant female with abdominal pain. Estimated gestational age last menstrual period equals 5 weeks  5 days. EXAM: OBSTETRIC <14 WK [place] AND TRANSVAGINAL OB US TECHNIQUE: Both transabdominal and  transvaginal ultrasound examinations were performed for complete evaluation of the gestation as well as the maternal uterus, adnexal regions, and pelvic cul-de-sac. Transvaginal technique was performed to assess early pregnancy. COMPARISON:  None. FINDINGS: Intrauterine gestational sac: Single Yolk sac:  Present Embryo:  Present Cardiac Activity: Not identified Heart Rate: N/a bpm CRL:  3 mm   5 w   5 d                  [place] EDC: 09/24/2021 Subchorionic hemorrhage:  Small subchorionic hemorrhage. Maternal uterus/adnexae: Normal ovaries and uterus. IMPRESSION: 1. 1. Single intrauterine gestation with embryo. No cardiac activity demonstrated at this early date. 2. Estimated gestational age by crown rump length equals 5 weeks 5 days. Electronically Signed   By: Genevive Bi M.D.   On: 01/27/2021 14:08    MAU Course  Procedures  MDM Labs and [place] ordered and reviewed. Early IUP @[redacted]w[redacted]d  on w/o cardiac activity. Recommend f/u US once she starts care. OB provider list given. Mild anemia noted, recommend starting [facility] w/Fe. Normal neuro exam, paresthesia and back pain likely MSK/nerve compression from body habitus and lifting. Instructed not to lift heavy items. Stable for discharge home.   Assessment and Plan   1. Early stage of pregnancy   2. Abdominal pain affecting pregnancy   3. Anemia affecting pregnancy in first trimester    Discharge home Follow up with OB provider of choice to start care SAB precautions Tylenol/heat prn  Allergies as of 01/27/2021   No Known Allergies      Medication List     STOP taking these medications    amLODipine 10 MG tablet Commonly known as: Norvasc   ferrous sulfate 325 (65 FE) MG tablet   ibuprofen 800 MG tablet Commonly known as: ADVIL   Magnesium 200 MG Tabs   senna-docusate 8.6-50 MG tablet Commonly known as: Senokot-S       TAKE these medications    multivitamin-prenatal 27-0.8 MG Tabs tablet Take 1 tablet by mouth daily at 12 noon.        05-26-1978, CNM 01/27/2021, 3:06 PM

## 2021-01-27 NOTE — ED Triage Notes (Signed)
C/O intermittent right-sided chest pains onset 01/21/2021; describes as a pinpoint pain only with inspiration at times. Denies any chest pain at this time. States went to ED 01/23/21 and had labs done, but left prior to seeing provider.   Also c/o intermittent sensation of BLE numbness onset 01/23/21, lasting hours at a time; occasionally c/o numbness more localized to right foot. Denies any parasthesias at present, but c/o bilat feet swelling since 12/7.  States was not told she is pregnant according to labs.

## 2021-01-27 NOTE — Progress Notes (Addendum)
Patient arrived to MAU from Urgent care complaining of abdominal pain that is 8/10, back pain 10/10, headache 5/10, occasional numbness to bilateral legs 10/10 and dizziness.patient stated that her symptoms started 2 wks ago. Patient stated that she was having chest pain, but that resolved. She just found out that she was pregnant. Patient denies vaginal bleeding and or leakage of fluid.

## 2021-01-27 NOTE — Discharge Instructions (Signed)
[facility]   [facility]             [address], [place], [place] [address] [phone]  [facility]                                                             [address], [address], [place], [place], [address] [phone]  [facility]                                    [address], [address], [place], [place], [address] [phone]  [facility] [place] [address], [address], [place], [place], [address] [phone]  [facility]                                 [address], [place], [place], [address] [phone]  [facility]                                    [address], [place], [place], [address] [phone]  [facility] [address], [address], [place], [place], [address]                              [facility] [address], [place], [place], [address] [phone]  [facility]         Phone: [phone]  [facility]      Phone: [phone]   [facility]      Phone: [phone]  [facility]         Phone: [phone]   [facility]    Phone: [phone]  [facility]      Phone: [phone]  [facility]     Phone: [phone]  [facility]      Phone: [phone]  

## 2021-01-27 NOTE — ED Provider Notes (Signed)
Patient here today for right sided chest pain, numbness in lower extremities, all of which are intermittent and not currently present. Upon further evaluation it is determined that she had positive hcg-- patient was not aware she was pregnant. Given symptoms, unconfirmed uterine pregnancy in presence of elevated HCG recommended further evaluation in women's ED. Patient is agreeable.   Tomi Bamberger, PA-C 01/27/21 1105

## 2021-01-28 LAB — GC/CHLAMYDIA PROBE AMP (~~LOC~~) NOT AT ARMC
Chlamydia: NEGATIVE
Comment: NEGATIVE
Comment: NORMAL
Neisseria Gonorrhea: NEGATIVE

## 2021-01-28 NOTE — Telephone Encounter (Signed)
Transition Care Management Unsuccessful Follow-up Telephone Call  Date of discharge and from where:  01/23/2021 from [facility]  Attempts:  3rd Attempt  Reason for unsuccessful TCM follow-up call:  Unable to reach patient

## 2021-03-19 ENCOUNTER — Ambulatory Visit: Payer: 59 | Admitting: *Deleted

## 2021-03-19 ENCOUNTER — Other Ambulatory Visit: Payer: Self-pay

## 2021-03-19 NOTE — Progress Notes (Deleted)
New OB Intake  I connected with  Krystal Hudson on 03/19/21 at  2:00 PM EST by {Contact:24193} Video Visit and verified that I am speaking with the correct person using two identifiers. Nurse is located at [facility] and pt is located at ***.  I discussed the limitations, risks, security and privacy concerns of performing an evaluation and management service by telephone and the availability of in person appointments. I also discussed with the patient that there may be a patient responsible charge related to this service. The patient expressed understanding and agreed to proceed.  I explained I am completing New OB Intake today. We discussed her EDD of *** that is based on LMP of ***. Pt is G***/P***. I reviewed her allergies, medications, Medical/Surgical/OB history, and appropriate screenings. I informed her of [facility] services. Based on history, this is a/an  pregnancy {Complicated/Uncomplicated Pregnancy:20185} .   Patient Active Problem List   Diagnosis Date Noted   IUD (intrauterine device) in place 12/20/2018   Indication for care in labor or delivery 11/19/2018   Maternal morbid obesity, antepartum (HCC) 11/18/2018   LGA (large for gestational age) fetus affecting management of mother 11/03/2018   Gestational hypertension 10/28/2018   Anemia in pregnancy 10/28/2018   Supervision of normal first pregnancy, antepartum 05/03/2018    Concerns addressed today  Delivery Plans:  Plans to deliver at [facility].   MyChart/Babyscripts MyChart access verified. I explained pt will have some visits in office and some virtually. Babyscripts instructions given and order placed. Patient verifies receipt of registration text/e-mail. Account successfully created and app downloaded.  Blood Pressure Cuff  {blood pressure cuff:24241} Explained after first prenatal appt pt will check weekly and document in Babyscripts.  Weight scale: Patient does / does not  have weight scale. Weight scale ordered for  patient to pick up from [employer].   Anatomy US Explained first scheduled [place] will be around 19 weeks. Anatomy US scheduled for *** at ***. Pt notified to arrive at ***.  Labs Discussed Avelina Laine genetic screening with patient. Would like both Panorama and Horizon drawn at new OB visit. Routine prenatal labs needed.  Covid Vaccine Patient {HAS/HAS NOT:20194} covid vaccine.   CenteringPregnancy Candidate?  If yes, offer as possibility  Mother/ Baby Dyad Candidate?    If yes, offer as possibility  Informed patient of [facility] website  and placed link in her AVS.   Social Determinants of Health Food Insecurity: {food:25541} [facility] Referral: Patient {ACTION; IS/IS SAY:30160109} interested in referral to [facility].  Transportation: {transportation:25542} Childcare: Discussed no children allowed at ultrasound appointments. Offered childcare services; {childcare:25540}  Send link to Pregnancy Navigators   Placed OB Box on problem list and updated  First visit review I reviewed new OB appt with pt. I explained she will have a pelvic exam, ob bloodwork with genetic screening, and PAP smear. Explained pt will be seen by *** at first visit; encounter routed to appropriate provider. Explained that patient will be seen by pregnancy navigator following visit with provider. [facility] information placed in AVS.   Ozzie Hoyle, CMA 03/19/2021  2:44 PM

## 2021-03-26 ENCOUNTER — Other Ambulatory Visit: Payer: Self-pay

## 2021-03-26 ENCOUNTER — Ambulatory Visit (INDEPENDENT_AMBULATORY_CARE_PROVIDER_SITE_OTHER): Payer: 59 | Admitting: Obstetrics

## 2021-03-26 ENCOUNTER — Encounter: Payer: Self-pay | Admitting: Obstetrics & Gynecology

## 2021-03-26 ENCOUNTER — Other Ambulatory Visit (HOSPITAL_COMMUNITY)
Admission: RE | Admit: 2021-03-26 | Discharge: 2021-03-26 | Disposition: A | Discharge status: Home / Self Care | Payer: 59 | Source: Ambulatory Visit | Attending: Obstetrics & Gynecology | Admitting: Obstetrics & Gynecology

## 2021-03-26 VITALS — BP 127/80 | HR 81 | Wt 290.0 lb

## 2021-03-26 DIAGNOSIS — Z3A14 14 weeks gestation of pregnancy: Secondary | ICD-10-CM | POA: Diagnosis not present

## 2021-03-26 DIAGNOSIS — Z3480 Encounter for supervision of other normal pregnancy, unspecified trimester: Secondary | ICD-10-CM | POA: Diagnosis present

## 2021-03-26 DIAGNOSIS — Z3482 Encounter for supervision of other normal pregnancy, second trimester: Secondary | ICD-10-CM | POA: Diagnosis not present

## 2021-03-26 DIAGNOSIS — O99210 Obesity complicating pregnancy, unspecified trimester: Secondary | ICD-10-CM

## 2021-03-26 DIAGNOSIS — Z3490 Encounter for supervision of normal pregnancy, unspecified, unspecified trimester: Secondary | ICD-10-CM | POA: Insufficient documentation

## 2021-03-26 DIAGNOSIS — O09899 Supervision of other high risk pregnancies, unspecified trimester: Secondary | ICD-10-CM

## 2021-03-26 DIAGNOSIS — O0990 Supervision of high risk pregnancy, unspecified, unspecified trimester: Secondary | ICD-10-CM

## 2021-03-26 MED ORDER — VITAFOL ULTRA 29-0.6-0.4-200 MG PO CAPS: 1.0000 | ORAL_CAPSULE | Freq: Every day | ORAL | 4 refills | Status: DC

## 2021-03-26 MED ORDER — BLOOD PRESSURE KIT DEVI: 1.0000 | 0 refills | Status: DC

## 2021-03-26 NOTE — Progress Notes (Signed)
Presents for Initial OB visit. PHQ9: 7 GAD7: 3

## 2021-03-26 NOTE — Progress Notes (Signed)
Subjective:    Krystal Hudson is being seen today for her first obstetrical visit.  This is not a planned pregnancy. She is at 7w0dgestation. Her obstetrical history is significant for large for gestational age, obesity, and anemia . Relationship with FOB: significant other, not living together.but supportive. Patient does intend to breast feed. Pregnancy history fully reviewed.  The information documented in the HPI was reviewed and verified.  Menstrual History: OB History     Gravida  2   Para  1   Term  1   Preterm      AB      Living  1      SAB      IAB      Ectopic      Multiple  0   Live Births  1            Patient's last menstrual period was 12/18/2020 (exact date).    Past Medical History:  Diagnosis Date   Anemia    Morbid [place] ([place]     Past Surgical History:  Procedure Laterality Date   CESAREAN SECTION N/A 11/20/2018   Procedure: CESAREAN SECTION;  Surgeon: DEmily Filbert MD;  Location: [facility] LD [facility]S;  Service: Obstetrics;  Laterality: N/A;   TYMPANOSTOMY TUBE PLACEMENT      (Not in a hospital admission)  No Known Allergies  Social History   Tobacco Use   Smoking status: Never   Smokeless tobacco: Never  Substance Use Topics   Alcohol use: Not Currently    Family History  Problem Relation Age of Onset   Healthy Mother    Hypertension Maternal Grandmother    Hypertension Maternal Grandfather      Review of Systems Constitutional: negative for weight loss Gastrointestinal: negative for vomiting Genitourinary:negative for genital lesions and vaginal discharge and dysuria Musculoskeletal:negative for back pain Behavioral/Psych: negative for abusive relationship, depression, illegal drug usage and tobacco use    Objective:    BP 127/80    Pulse 81    Wt 290 lb (131.5 kg)    LMP 12/18/2020 (Exact Date) Comment: LMP given by patient   BMI 49.78 kg/m  General Appearance:    Alert, cooperative, no distress, appears stated age   Head:    Normocephalic, without obvious abnormality, atraumatic  Eyes:    PERRL, conjunctiva/corneas clear, EOM's intact, fundi    benign, both eyes  Ears:    Normal TM's and external ear canals, both ears  Nose:   Nares normal, septum midline, mucosa normal, no drainage    or sinus tenderness  Throat:   Lips, mucosa, and tongue normal; teeth and gums normal  Neck:   Supple, symmetrical, trachea midline, no adenopathy;    thyroid:  no enlargement/tenderness/nodules; no carotid   bruit or JVD  Back:     Symmetric, no curvature, ROM normal, no CVA tenderness  Lungs:     Clear to auscultation bilaterally, respirations unlabored  Chest Wall:    No tenderness or deformity   Heart:    Regular rate and rhythm, S1 and S2 normal, no murmur, rub   or gallop  Breast Exam:    No tenderness, masses, or nipple abnormality  Abdomen:     Soft, non-tender, bowel sounds active all four quadrants,    no masses, no organomegaly  Genitalia:    Normal female without lesion, discharge or tenderness  Extremities:   Extremities normal, atraumatic, no cyanosis or edema  Pulses:   2+ and  symmetric all extremities  Skin:   Skin color, texture, turgor normal, no rashes or lesions  Lymph nodes:   Cervical, supraclavicular, and axillary nodes normal  Neurologic:   CNII-XII intact, normal strength, sensation and reflexes    throughout      Lab Review Urine pregnancy test Labs reviewed yes Radiologic studies reviewed no  Assessment:    Pregnancy at 26w0dweeks    Plan:    1. Supervision of high risk pregnancy, antepartum - history of LGA fetus - previous C/S for arrest of labor ( arrest of dilatation / descent ) - history of anemia during previous pregnancy Rx: - Cytology - PAP( [place]) - Cervicovaginal ancillary only( [place]) - CBC/D/Plt+RPR+Rh+ABO+RubIgG... - Culture, OB Urine - Genetic Screening - Enroll Patient in PreNatal Babyscripts - Babyscripts Schedule Optimization - Blood  Pressure Monitoring (BLOOD PRESSURE KIT) DEVI; 1 Device by Does not apply route once a week.  Dispense: 1 each; Refill: 0 - Prenat-Fe Poly-Methfol-FA-DHA (VITAFOL ULTRA) 29-0.6-0.4-200 MG CAPS; Take 1 capsule by mouth daily before breakfast.  Dispense: 90 capsule; Refill: 4  2. Obesity affecting pregnancy, antepartum   3. Prior pregnancy complicated by PIH, antepartum    Prenatal vitamins.  Counseling provided regarding continued use of seat belts, cessation of alcohol consumption, smoking or use of illicit drugs; infection precautions i.e., influenza/TDAP immunizations, toxoplasmosis,CMV, parvovirus, listeria and varicella; workplace safety, exercise during pregnancy; routine dental care, safe medications, sexual activity, hot tubs, saunas, pools, travel, caffeine use, fish and methlymercury, potential toxins, hair treatments, varicose veins Weight gain recommendations per IOM guidelines reviewed: underweight/BMI< 18.5--> gain 28 - 40 lbs; normal weight/BMI 18.5 - 24.9--> gain 25 - 35 lbs; overweight/BMI 25 - 29.9--> gain 15 - 25 lbs; obese/BMI >30->gain  11 - 20 lbs Problem list reviewed and updated. FIRST/CF mutation testing/NIPT/QUAD SCREEN/fragile X/Ashkenazi Jewish population testing/Spinal muscular atrophy discussed: requested. Role of ultrasound in pregnancy discussed; fetal survey: requested. Amniocentesis discussed: not indicated.  Meds ordered this encounter  Medications   Blood Pressure Monitoring (BLOOD PRESSURE KIT) DEVI    Sig: 1 Device by Does not apply route once a week.    Dispense:  1 each    Refill:  0   Orders Placed This Encounter  Procedures   Culture, OB Urine   CBC/D/Plt+RPR+Rh+ABO+RubIgG...   Genetic Screening    Follow up in 4 weeks.  I have spent a total of 20 minutes of face-to-face time, excluding clinical staff time, reviewing notes and preparing to see patient, ordering tests and/or medications, and counseling the patient.    HShelly Bombard  MD 03/26/2021 11:43 AM

## 2021-03-27 ENCOUNTER — Other Ambulatory Visit: Payer: Self-pay | Admitting: Obstetrics

## 2021-03-27 DIAGNOSIS — B9689 Other specified bacterial agents as the cause of diseases classified elsewhere: Secondary | ICD-10-CM

## 2021-03-27 DIAGNOSIS — B379 Candidiasis, unspecified: Secondary | ICD-10-CM

## 2021-03-27 DIAGNOSIS — N760 Acute vaginitis: Secondary | ICD-10-CM

## 2021-03-27 LAB — CBC/D/PLT+RPR+RH+ABO+RUBIGG...
Antibody Screen: NEGATIVE
Basophils Absolute: 0 10*3/uL (ref 0.0–0.2)
Basos: 0 %
EOS (ABSOLUTE): 0 10*3/uL (ref 0.0–0.4)
Eos: 0 %
HCV Ab: <0.1 s/co ratio (ref 0.0–0.9)
HIV Screen 4th Generation wRfx: NONREACTIVE
Hematocrit: 37.3 % (ref 34.0–46.6)
Hemoglobin: 12.3 g/dL (ref 11.1–15.9)
Hepatitis B Surface Ag: NEGATIVE
Immature Grans (Abs): 0 10*3/uL (ref 0.0–0.1)
Immature Granulocytes: 0 %
Lymphocytes Absolute: 2.1 10*3/uL (ref 0.7–3.1)
Lymphs: 30 %
MCH: 26.8 pg (ref 26.6–33.0)
MCHC: 33 g/dL (ref 31.5–35.7)
MCV: 81 fL (ref 79–97)
Monocytes Absolute: 0.4 10*3/uL (ref 0.1–0.9)
Monocytes: 6 %
Neutrophils Absolute: 4.5 10*3/uL (ref 1.4–7.0)
Neutrophils: 64 %
Platelets: 306 10*3/uL (ref 150–450)
RBC: 4.59 x10E6/uL (ref 3.77–5.28)
RDW: 14.6 % (ref 11.7–15.4)
RPR Ser Ql: NONREACTIVE
Rh Factor: POSITIVE
Rubella Antibodies, IGG: 2.44 index (ref >0.99)
WBC: 7 10*3/uL (ref 3.4–10.8)

## 2021-03-27 LAB — CERVICOVAGINAL ANCILLARY ONLY
Bacterial Vaginitis (gardnerella): NEGATIVE
Candida Glabrata: NEGATIVE
Candida Vaginitis: POSITIVE — AB
Chlamydia: NEGATIVE
Comment: NEGATIVE
Comment: NEGATIVE
Comment: NEGATIVE
Comment: NEGATIVE
Comment: NEGATIVE
Comment: NORMAL
Neisseria Gonorrhea: NEGATIVE
Trichomonas: NEGATIVE

## 2021-03-27 LAB — HCV INTERPRETATION

## 2021-03-27 MED ORDER — TERCONAZOLE 0.4 % VA CREA: 1.0000 | TOPICAL_CREAM | Freq: Every day | VAGINAL | 0 refills | Status: DC

## 2021-03-28 LAB — CYTOLOGY - PAP: Diagnosis: NEGATIVE

## 2021-03-28 LAB — URINE CULTURE, OB REFLEX

## 2021-03-28 LAB — CULTURE, OB URINE

## 2021-04-03 ENCOUNTER — Encounter: Payer: Self-pay | Admitting: Obstetrics

## 2021-04-15 ENCOUNTER — Encounter: Payer: Self-pay | Admitting: Obstetrics

## 2021-04-23 ENCOUNTER — Encounter: Payer: Self-pay | Admitting: Obstetrics

## 2021-04-23 ENCOUNTER — Other Ambulatory Visit: Payer: Self-pay

## 2021-04-23 ENCOUNTER — Ambulatory Visit (INDEPENDENT_AMBULATORY_CARE_PROVIDER_SITE_OTHER): Payer: 59 | Admitting: Obstetrics

## 2021-04-23 VITALS — BP 124/75 | HR 77 | Wt 296.0 lb

## 2021-04-23 DIAGNOSIS — Z98891 History of uterine scar from previous surgery: Secondary | ICD-10-CM

## 2021-04-23 DIAGNOSIS — O0990 Supervision of high risk pregnancy, unspecified, unspecified trimester: Secondary | ICD-10-CM | POA: Diagnosis not present

## 2021-04-23 DIAGNOSIS — O99210 Obesity complicating pregnancy, unspecified trimester: Secondary | ICD-10-CM

## 2021-04-23 DIAGNOSIS — O09899 Supervision of other high risk pregnancies, unspecified trimester: Secondary | ICD-10-CM

## 2021-04-23 MED ORDER — ASPIRIN 81 MG PO CHEW: 81.0000 mg | CHEWABLE_TABLET | Freq: Every day | ORAL | 5 refills | Status: DC

## 2021-04-23 NOTE — Progress Notes (Signed)
Subjective:  ?Krystal Hudson is a 26 y.o. G2P1001 at [redacted]w[redacted]d being seen today for ongoing prenatal care.  She is currently monitored for the following issues for this high-risk pregnancy and has Supervision of normal first pregnancy, antepartum; Gestational hypertension; Anemia in pregnancy; LGA (large for gestational age) fetus affecting management of mother; Maternal morbid obesity, antepartum (HCC); Indication for care in labor or delivery; IUD (intrauterine device) in place; Supervision of other normal pregnancy, antepartum; and Supervision of normal pregnancy on their problem list. ? ?Patient reports backache.  Contractions: Not present. Vag. Bleeding: None.  Movement: Present. Denies leaking of fluid.  ? ?The following portions of the patient's history were reviewed and updated as appropriate: allergies, current medications, past family history, past medical history, past social history, past surgical history and problem list. Problem list updated. ? ?Objective:  ? ?Vitals:  ? 04/23/21 1615  ?BP: 124/75  ?Pulse: 77  ?Weight: 296 lb (134.3 kg)  ? ? ?Fetal Status:     Movement: Present    ? ?General:  Alert, oriented and cooperative. Patient is in no acute distress.  ?Skin: Skin is warm and dry. No rash noted.   ?Cardiovascular: Normal heart rate noted  ?Respiratory: Normal respiratory effort, no problems with respiration noted  ?Abdomen: Soft, gravid, appropriate for gestational age. Pain/Pressure: Present     ?Pelvic:  Cervical exam deferred        ?Extremities: Normal range of motion.     ?Mental Status: Normal mood and affect. Normal behavior. Normal judgment and thought content.  ? ?Urinalysis:     ? ?Assessment and Plan:  ?Pregnancy: G2P1001 at [redacted]w[redacted]d ? ?1. Supervision of high risk pregnancy, antepartum ?Rx: ?- AFP, Serum, Open Spina Bifida ?- [place] MFM OB COMP + 14 WK; Future ?- [place] MFM OB DETAIL +14 WK; Future ? ?2. Previous primary low transverse cesarean section for arrest of dilatation / descent at  5 cm ?-  not sure if she wants a TOLAC.   ? ?3. Prior pregnancy complicated by PIH, antepartum ?Rx: ?- aspirin 81 MG chewable tablet; Chew 1 tablet (81 mg total) by mouth daily.  Dispense: 30 tablet; Refill: 5 ? ?4. Obesity affecting pregnancy, antepartum ? ? ? ?Preterm labor symptoms and general obstetric precautions including but not limited to vaginal bleeding, contractions, leaking of fluid and fetal movement were reviewed in detail with the patient. ?Please refer to After Visit Summary for other counseling recommendations.  ? ?[facility] in about 4 weeks (around 05/21/2021[facility] ? ? ?Brock Bad, MD  ?04/23/21  ?

## 2021-04-26 LAB — AFP, SERUM, OPEN SPINA BIFIDA
AFP MoM: 1.12
AFP Value: 34 ng/mL
Gest. Age on Collection Date: 18 weeks
Maternal Age At EDD: 26.1 yr
OSBR Risk 1 IN: 8371
Test Results:: NEGATIVE
Weight: 296 [lb_av]

## 2021-05-14 ENCOUNTER — Other Ambulatory Visit: Payer: Self-pay | Admitting: *Deleted

## 2021-05-14 ENCOUNTER — Ambulatory Visit: Payer: 59 | Attending: Obstetrics

## 2021-05-14 ENCOUNTER — Ambulatory Visit (HOSPITAL_BASED_OUTPATIENT_CLINIC_OR_DEPARTMENT_OTHER): Payer: 59 | Admitting: Obstetrics

## 2021-05-14 ENCOUNTER — Other Ambulatory Visit: Payer: Self-pay

## 2021-05-14 ENCOUNTER — Ambulatory Visit: Payer: 59 | Admitting: *Deleted

## 2021-05-14 VITALS — BP 105/51 | HR 78

## 2021-05-14 DIAGNOSIS — Z3A21 21 weeks gestation of pregnancy: Secondary | ICD-10-CM | POA: Diagnosis not present

## 2021-05-14 DIAGNOSIS — Z8759 Personal history of other complications of pregnancy, childbirth and the puerperium: Secondary | ICD-10-CM

## 2021-05-14 DIAGNOSIS — O34219 Maternal care for unspecified type scar from previous cesarean delivery: Secondary | ICD-10-CM | POA: Insufficient documentation

## 2021-05-14 DIAGNOSIS — Z3482 Encounter for supervision of other normal pregnancy, second trimester: Secondary | ICD-10-CM

## 2021-05-14 DIAGNOSIS — O99212 Obesity complicating pregnancy, second trimester: Secondary | ICD-10-CM

## 2021-05-14 DIAGNOSIS — O35BXX0 Maternal care for other (suspected) fetal abnormality and damage, fetal cardiac anomalies, not applicable or unspecified: Secondary | ICD-10-CM | POA: Insufficient documentation

## 2021-05-14 DIAGNOSIS — O09292 Supervision of pregnancy with other poor reproductive or obstetric history, second trimester: Secondary | ICD-10-CM | POA: Diagnosis not present

## 2021-05-14 DIAGNOSIS — Z3480 Encounter for supervision of other normal pregnancy, unspecified trimester: Secondary | ICD-10-CM | POA: Insufficient documentation

## 2021-05-14 DIAGNOSIS — O0990 Supervision of high risk pregnancy, unspecified, unspecified trimester: Secondary | ICD-10-CM | POA: Insufficient documentation

## 2021-05-14 DIAGNOSIS — Z148 Genetic carrier of other disease: Secondary | ICD-10-CM | POA: Diagnosis not present

## 2021-05-14 DIAGNOSIS — Z363 Encounter for antenatal screening for malformations: Secondary | ICD-10-CM | POA: Insufficient documentation

## 2021-05-14 DIAGNOSIS — D563 Thalassemia minor: Secondary | ICD-10-CM

## 2021-05-15 NOTE — Progress Notes (Signed)
MFM Note ? ?Krystal Hudson was seen for a detailed fetal anatomy scan due to maternal obesity with a BMI of 50. ? ?She denies any significant past medical history and denies any problems in her current pregnancy. ?   ?She had a cell free DNA test earlier in her pregnancy which indicated a low risk for trisomy 13, 74, and 13. A female fetus is predicted.  ? ?She was informed that the fetal growth and amniotic fluid level were appropriate for her gestational age.  ? ?On today's exam, an intracardiac echogenic focus was noted in the left ventricle of the fetal heart.   ? ?The small association between an echogenic focus and Down syndrome was discussed.  ? ?Due to the echogenic focus noted today, the patient was offered and declined an amniocentesis today for definitive diagnosis of fetal aneuploidy.  She reports that she is comfortable with her negative cell free DNA test. ? ?The patient was informed that anomalies may be missed due to technical limitations. If the fetus is in a suboptimal position or maternal habitus is increased, visualization of the fetus in the maternal uterus may be impaired. ? ?The following were discussed during our consultation today: ? ?Obesity in pregnancy ? ?The recommended total weight gain in pregnancy for obese women's between 10 to 20 pounds. ? ?Due to obesity, she should be screened for gestational diabetes at between 54 to 28 weeks.   ? ?We will continue to follow her with monthly growth scans.   ? ?Weekly fetal testing should be started at around 34 weeks. ? ?As maternal obesity may present challenges associated with the management of anesthesia, an anesthesia consult should be obtained when she is admitted in labor. ? ?A follow-up exam was scheduled in 4 weeks. ? ?The patient stated that all of her questions have been answered to her satisfaction.  ?   ?A total of 30 minutes was spent counseling and coordinating the care for this patient.  Greater than 50% of the time was spent in  direct face-to-face contact.  ?

## 2021-05-21 ENCOUNTER — Ambulatory Visit (INDEPENDENT_AMBULATORY_CARE_PROVIDER_SITE_OTHER): Payer: 59 | Admitting: Obstetrics & Gynecology

## 2021-05-21 VITALS — BP 121/77 | HR 91 | Wt 301.0 lb

## 2021-05-21 DIAGNOSIS — O99210 Obesity complicating pregnancy, unspecified trimester: Secondary | ICD-10-CM

## 2021-05-21 DIAGNOSIS — Z3482 Encounter for supervision of other normal pregnancy, second trimester: Secondary | ICD-10-CM

## 2021-05-21 DIAGNOSIS — O34219 Maternal care for unspecified type scar from previous cesarean delivery: Secondary | ICD-10-CM

## 2021-05-21 NOTE — Progress Notes (Signed)
? ?  PRENATAL VISIT NOTE ? ?Subjective:  ?Krystal Hudson is a 26 y.o. G2P1001 at [redacted]w[redacted]d being seen today for ongoing prenatal care.  She is currently monitored for the following issues for this high-risk pregnancy and has Maternal morbid obesity, antepartum ([place]) and Supervision of normal pregnancy on their problem list. ? ?Patient reports backache.  Contractions: Not present. Vag. Bleeding: None.  Movement: Present. Denies leaking of fluid.  ? ?The following portions of the patient's history were reviewed and updated as appropriate: allergies, current medications, past family history, past medical history, past social history, past surgical history and problem list.  ? ?Objective:  ? ?Vitals:  ? 05/21/21 1624  ?BP: 121/77  ?Pulse: 91  ?Weight: (!) 301 lb (136.5 kg)  ? ? ?Fetal Status: Fetal Heart Rate (bpm): 153   Movement: Present    ? ?General:  Alert, oriented and cooperative. Patient is in no acute distress.  ?Skin: Skin is warm and dry. No rash noted.   ?Cardiovascular: Normal heart rate noted  ?Respiratory: Normal respiratory effort, no problems with respiration noted  ?Abdomen: Soft, gravid, appropriate for gestational age.  Pain/Pressure: Present     ?Pelvic: Cervical exam deferred        ?Extremities: Normal range of motion.  Edema: None  ?Mental Status: Normal mood and affect. Normal behavior. Normal judgment and thought content.  ? ?Assessment and Plan:  ?Pregnancy: G2P1001 at [redacted]w[redacted]d ?1. Encounter for supervision of other normal pregnancy in second trimester ?Reviewed US/ f/u in 4 weeks ? ?2. Maternal morbid obesity, antepartum ([place]) ?Body mass index is 51.67 kg/m?. ? ? ?Preterm labor symptoms and general obstetric precautions including but not limited to vaginal bleeding, contractions, leaking of fluid and fetal movement were reviewed in detail with the patient. ?Please refer to After Visit Summary for other counseling recommendations.  ? ?Return in about 4 weeks (around 06/18/2021). ? ?Future Appointments   ?Date Time Provider [place]  ?06/11/2021  3:30 PM [facility] NURSE [facility]  ?06/11/2021  3:45 PM [facility] US1 [facility]  ?06/18/2021  8:35 AM Shelly Bombard, MD [facility] None  ? ? ?Emeterio Reeve, MD ?

## 2021-05-26 ENCOUNTER — Other Ambulatory Visit: Payer: Self-pay

## 2021-05-26 ENCOUNTER — Inpatient Hospital Stay (HOSPITAL_COMMUNITY): Payer: 59

## 2021-05-26 ENCOUNTER — Telehealth: Payer: Self-pay | Admitting: Advanced Practice Midwife

## 2021-05-26 ENCOUNTER — Encounter (HOSPITAL_COMMUNITY): Payer: Self-pay | Admitting: Obstetrics & Gynecology

## 2021-05-26 ENCOUNTER — Inpatient Hospital Stay (HOSPITAL_COMMUNITY)
Admission: AD | Admit: 2021-05-26 | Discharge: 2021-05-26 | Disposition: A | Discharge status: Home / Self Care | Payer: 59 | Attending: Obstetrics & Gynecology | Admitting: Obstetrics & Gynecology

## 2021-05-26 DIAGNOSIS — Z3A22 22 weeks gestation of pregnancy: Secondary | ICD-10-CM | POA: Insufficient documentation

## 2021-05-26 DIAGNOSIS — O212 Late vomiting of pregnancy: Secondary | ICD-10-CM | POA: Insufficient documentation

## 2021-05-26 DIAGNOSIS — Z79899 Other long term (current) drug therapy: Secondary | ICD-10-CM | POA: Diagnosis not present

## 2021-05-26 DIAGNOSIS — O219 Vomiting of pregnancy, unspecified: Secondary | ICD-10-CM | POA: Diagnosis not present

## 2021-05-26 DIAGNOSIS — K760 Fatty (change of) liver, not elsewhere classified: Secondary | ICD-10-CM

## 2021-05-26 DIAGNOSIS — O26612 Liver and biliary tract disorders in pregnancy, second trimester: Secondary | ICD-10-CM | POA: Diagnosis not present

## 2021-05-26 DIAGNOSIS — Z3482 Encounter for supervision of other normal pregnancy, second trimester: Secondary | ICD-10-CM

## 2021-05-26 DIAGNOSIS — O26892 Other specified pregnancy related conditions, second trimester: Secondary | ICD-10-CM | POA: Insufficient documentation

## 2021-05-26 DIAGNOSIS — R1013 Epigastric pain: Secondary | ICD-10-CM

## 2021-05-26 DIAGNOSIS — R109 Unspecified abdominal pain: Secondary | ICD-10-CM | POA: Diagnosis not present

## 2021-05-26 DIAGNOSIS — Z7982 Long term (current) use of aspirin: Secondary | ICD-10-CM | POA: Diagnosis not present

## 2021-05-26 DIAGNOSIS — O26619 Liver and biliary tract disorders in pregnancy, unspecified trimester: Secondary | ICD-10-CM

## 2021-05-26 HISTORY — DX: Essential (primary) hypertension: I10

## 2021-05-26 LAB — COMPREHENSIVE METABOLIC PANEL
ALT: 16 U/L (ref 0–44)
AST: 19 U/L (ref 15–41)
Albumin: 2.8 g/dL — ABNORMAL LOW (ref 3.5–5.0)
Alkaline Phosphatase: 57 U/L (ref 38–126)
Anion gap: 7 (ref 5–15)
BUN: 5 mg/dL — ABNORMAL LOW (ref 6–20)
CO2: 22 mmol/L (ref 22–32)
Calcium: 8.9 mg/dL (ref 8.9–10.3)
Chloride: 106 mmol/L (ref 98–111)
Creatinine, Ser: 0.5 mg/dL (ref 0.44–1.00)
GFR, Estimated: >60 mL/min (ref >60)
Glucose, Bld: 95 mg/dL (ref 70–99)
Potassium: 4.5 mmol/L (ref 3.5–5.1)
Sodium: 135 mmol/L (ref 135–145)
Total Bilirubin: 0.6 mg/dL (ref 0.3–1.2)
Total Protein: 6.3 g/dL — ABNORMAL LOW (ref 6.5–8.1)

## 2021-05-26 LAB — CBC WITH DIFFERENTIAL/PLATELET
Abs Immature Granulocytes: 0.06 10*3/uL (ref 0.00–0.07)
Basophils Absolute: 0 10*3/uL (ref 0.0–0.1)
Basophils Relative: 0 %
Eosinophils Absolute: 0 10*3/uL (ref 0.0–0.5)
Eosinophils Relative: 0 %
HCT: 34.4 % — ABNORMAL LOW (ref 36.0–46.0)
Hemoglobin: 11.2 g/dL — ABNORMAL LOW (ref 12.0–15.0)
Immature Granulocytes: 1 %
Lymphocytes Relative: 11 %
Lymphs Abs: 1 10*3/uL (ref 0.7–4.0)
MCH: 27.5 pg (ref 26.0–34.0)
MCHC: 32.6 g/dL (ref 30.0–36.0)
MCV: 84.5 fL (ref 80.0–100.0)
Monocytes Absolute: 0.4 10*3/uL (ref 0.1–1.0)
Monocytes Relative: 4 %
Neutro Abs: 8.2 10*3/uL — ABNORMAL HIGH (ref 1.7–7.7)
Neutrophils Relative %: 84 %
Platelets: 273 10*3/uL (ref 150–400)
RBC: 4.07 MIL/uL (ref 3.87–5.11)
RDW: 14.1 % (ref 11.5–15.5)
WBC: 9.7 10*3/uL (ref 4.0–10.5)
nRBC: 0 % (ref 0.0–0.2)

## 2021-05-26 LAB — URINALYSIS, ROUTINE W REFLEX MICROSCOPIC
Bilirubin Urine: NEGATIVE
Glucose, UA: NEGATIVE mg/dL
Hgb urine dipstick: NEGATIVE
Ketones, ur: 20 mg/dL — AB
Leukocytes,Ua: NEGATIVE
Nitrite: NEGATIVE
Protein, ur: NEGATIVE mg/dL
Specific Gravity, Urine: 1.023 (ref 1.005–1.030)
pH: 5 (ref 5.0–8.0)

## 2021-05-26 LAB — AMYLASE: Amylase: 35 U/L (ref 28–100)

## 2021-05-26 LAB — LIPASE, BLOOD: Lipase: 25 U/L (ref 11–51)

## 2021-05-26 MED ORDER — ONDANSETRON 4 MG PO TBDP: 4.0000 mg | ORAL_TABLET | Freq: Four times a day (QID) | ORAL | 1 refills | Status: DC | PRN

## 2021-05-26 MED ORDER — FAMOTIDINE 20 MG PO TABS: 20.0000 mg | ORAL_TABLET | Freq: Two times a day (BID) | ORAL | 3 refills | Status: DC

## 2021-05-26 MED ORDER — FAMOTIDINE 20 MG PO TABS
20.0000 mg | ORAL_TABLET | Freq: Once | ORAL | Status: AC
  Administered 2021-05-26: 20 mg via ORAL
  Filled 2021-05-26: qty 1

## 2021-05-26 NOTE — Telephone Encounter (Signed)
Patient needs referral to gastroenterologist for new finding of hepatic steatosis.  [redacted] weeks gestation.  Also has mild,, chronic hypertension. ?

## 2021-05-26 NOTE — MAU Note (Signed)
Krystal Hudson is a 26 y.o. at [redacted]w[redacted]d here in MAU reporting: intermittent abdominal cramping that began this morning.  Denies VB or LOF.  Endorses +FM ? ?Onset of complaint: today ?Pain score: 10/10 ?Vitals:  ? 05/26/21 1318  ?BP: 129/67  ?Pulse: 85  ?Resp: 18  ?Temp: 98.3 ?F (36.8 ?C)  ?SpO2: 98%  ?   ?FHT:156 bpm ?Lab orders placed from triage:   UA ?

## 2021-05-26 NOTE — MAU Provider Note (Signed)
Chief Complaint:  Abdominal Pain ? ? Event Date/Time  ? First Provider Initiated Contact with Patient 05/26/21 1354   ?  ?HPI: Krystal NeriDenise Makki is a 26 y.o. G2P1001 at 6958w5d who presents to maternity admissions reporting upper abdominal pain since this morning and N/V x 2 immediately before coming to MAU. No Hx N/V or similar Sx.  ? ?Location: Mid upper abd ?Quality: sharp ?Severity: 10/10 in pain scale at worse, 5/10 after vomiting.  ?Duration: < 12 hours ?Context: [redacted] weeks gestation. Uncomplicated pregnancy.  ?Timing: Constant ?Modifying factors: No aggravating or alleviating factors.  ?Associated signs and symptoms: Neg for fever, chills, diarrhea, constipation, headache, vision changes, contractions, leakage of fluid or vaginal bleeding. Good fetal movement.  ? ?Patient Active Problem List  ? Diagnosis Date Noted  ? History of cesarean section complicating pregnancy 05/21/2021  ? Supervision of normal pregnancy 03/26/2021  ? Maternal morbid obesity, antepartum (HCC) 11/18/2018  ? ? ?Past Medical History:  ?Diagnosis Date  ? Anemia   ? Hypertension   ? Morbid obesity (HCC)   ? ?OB History  ?Gravida Para Term Preterm AB Living  ?2 1 1     1   ?SAB IAB Ectopic Multiple Live Births  ?      0 1  ?  ?# Outcome Date GA Lbr Len/2nd Weight Sex Delivery Anes PTL Lv  ?2 Current           ?1 Term 11/20/18 937w6d  3850 g M CS-LTranv Spinal  LIV  ?   Birth Comments:  NA  ? ?Past Surgical History:  ?Procedure Laterality Date  ? CESAREAN SECTION N/A 11/20/2018  ? Procedure: CESAREAN SECTION;  Surgeon: Allie Bossierove, Myra C, MD;  Location: [facility];  Service: Obstetrics;  Laterality: N/A;  ? TYMPANOSTOMY TUBE PLACEMENT    ? ?Family History  ?Problem Relation Age of Onset  ? Healthy Mother   ? Hypertension Maternal Grandmother   ? Hypertension Maternal Grandfather   ? ?Social History  ? ?Tobacco Use  ? Smoking status: Never  ? Smokeless tobacco: Never  ?Vaping Use  ? Vaping Use: Former  ? Quit date: 04/17/2018  ?Substance Use Topics  ?  Alcohol use: Not Currently  ? Drug use: Never  ? ?No Known Allergies ?No medications prior to admission.  ? ? ?I have reviewed patient's Past Medical Hx, Surgical Hx, Family Hx, Social Hx, medications and allergies.  ? ?ROS:  ?Review of Systems  ?Constitutional:  Negative for appetite change, chills and fever.  ?Eyes:  Negative for visual disturbance.  ?Gastrointestinal:  Positive for abdominal pain, nausea and vomiting. Negative for abdominal distention, constipation and diarrhea.  ?Genitourinary:  Negative for vaginal bleeding.  ?Musculoskeletal:  Positive for back pain (Mid back pain).  ?Neurological:  Negative for headaches.  ? ?Physical Exam  ?Patient Vitals for the past 24 hrs: ? BP Temp Temp src Pulse Resp SpO2 Height Weight  ?05/26/21 1800 (!) 148/78 -- -- -- -- -- -- --  ?05/26/21 1756 (!) 148/78 -- -- 97 -- -- -- --  ?05/26/21 1440 -- -- -- -- -- 98 % -- --  ?05/26/21 1435 -- -- -- -- -- 97 % -- --  ?05/26/21 1430 (!) 125/58 -- -- 100 -- 98 % -- --  ?05/26/21 1425 -- -- -- -- -- 99 % -- --  ?05/26/21 1420 -- -- -- -- -- 98 % -- --  ?05/26/21 1415 127/67 -- -- 91 -- 98 % -- --  ?05/26/21 1410 -- -- -- -- --  97 % -- --  ?05/26/21 1405 -- -- -- -- -- 97 % -- --  ?05/26/21 1400 -- -- -- -- -- 96 % -- --  ?05/26/21 1355 -- -- -- -- -- 97 % -- --  ?05/26/21 1350 -- -- -- -- -- 98 % -- --  ?05/26/21 1345 140/69 -- -- -- -- 96 % -- --  ?05/26/21 1343 -- -- -- -- -- 96 % -- --  ?05/26/21 1340 -- -- -- -- -- 96 % -- --  ?05/26/21 1318 129/67 98.3 ?F (36.8 ?C) Oral 85 18 98 % -- --  ?05/26/21 1314 -- -- -- -- -- -- 5\' 4"  (1.626 m) 135.9 kg  ? ?Constitutional: Well-developed, well-nourished female in no acute distress.  ?Cardiovascular: normal rate ?Respiratory: normal effort ?GI: Abd soft, mild epigastric and right upper quadrant tenderness to palpation.  Liver nonpalpable.  Gravid appropriate for gestational age. Pos BS x 4 ?MS: Extremities nontender, no edema, normal ROM ?Neurologic: Alert and oriented x 4.  ?GU:  Neg CVAT. ? Pelvic: Deferred ?FHT:  156 by Doppler    ?Labs: ?Results for orders placed or performed during the hospital encounter of 05/26/21 (from the past 24 hour(s))  ?Urinalysis, Routine w reflex microscopic Urine, Clean Catch     Status: Abnormal  ? Collection Time: 05/26/21  1:24 PM  ?Result Value Ref Range  ? Color, Urine YELLOW YELLOW  ? APPearance HAZY (A) CLEAR  ? Specific Gravity, Urine 1.023 1.005 - 1.030  ? pH 5.0 5.0 - 8.0  ? Glucose, UA NEGATIVE NEGATIVE mg/dL  ? Hgb urine dipstick NEGATIVE NEGATIVE  ? Bilirubin Urine NEGATIVE NEGATIVE  ? Ketones, ur 20 (A) NEGATIVE mg/dL  ? Protein, ur NEGATIVE NEGATIVE mg/dL  ? Nitrite NEGATIVE NEGATIVE  ? Leukocytes,Ua NEGATIVE NEGATIVE  ?CBC with Differential/Platelet     Status: Abnormal  ? Collection Time: 05/26/21  2:30 PM  ?Result Value Ref Range  ? WBC 9.7 4.0 - 10.5 K/uL  ? RBC 4.07 3.87 - 5.11 MIL/uL  ? Hemoglobin 11.2 (L) 12.0 - 15.0 g/dL  ? HCT 34.4 (L) 36.0 - 46.0 %  ? MCV 84.5 80.0 - 100.0 fL  ? MCH 27.5 26.0 - 34.0 pg  ? MCHC 32.6 30.0 - 36.0 g/dL  ? RDW 14.1 11.5 - 15.5 %  ? Platelets 273 150 - 400 K/uL  ? nRBC 0.0 0.0 - 0.2 %  ? Neutrophils Relative % 84 %  ? Neutro Abs 8.2 (H) 1.7 - 7.7 K/uL  ? Lymphocytes Relative 11 %  ? Lymphs Abs 1.0 0.7 - 4.0 K/uL  ? Monocytes Relative 4 %  ? Monocytes Absolute 0.4 0.1 - 1.0 K/uL  ? Eosinophils Relative 0 %  ? Eosinophils Absolute 0.0 0.0 - 0.5 K/uL  ? Basophils Relative 0 %  ? Basophils Absolute 0.0 0.0 - 0.1 K/uL  ? Immature Granulocytes 1 %  ? Abs Immature Granulocytes 0.06 0.00 - 0.07 K/uL  ?Comprehensive metabolic panel     Status: Abnormal  ? Collection Time: 05/26/21  2:30 PM  ?Result Value Ref Range  ? Sodium 135 135 - 145 mmol/L  ? Potassium 4.5 3.5 - 5.1 mmol/L  ? Chloride 106 98 - 111 mmol/L  ? CO2 22 22 - 32 mmol/L  ? Glucose, Bld 95 70 - 99 mg/dL  ? BUN 5 (L) 6 - 20 mg/dL  ? Creatinine, Ser 0.50 0.44 - 1.00 mg/dL  ? Calcium 8.9 8.9 - 10.3 mg/dL  ? Total Protein  6.3 (L) 6.5 - 8.1 g/dL  ? Albumin  2.8 (L) 3.5 - 5.0 g/dL  ? AST 19 15 - 41 U/L  ? ALT 16 0 - 44 U/L  ? Alkaline Phosphatase 57 38 - 126 U/L  ? Total Bilirubin 0.6 0.3 - 1.2 mg/dL  ? GFR, Estimated >60 >60 mL/min  ? Anion gap 7 5 - 15  ?Amylase     Status: None  ? Collection Time: 05/26/21  2:30 PM  ?Result Value Ref Range  ? Amylase 35 28 - 100 U/L  ?Lipase, blood     Status: None  ? Collection Time: 05/26/21  2:30 PM  ?Result Value Ref Range  ? Lipase 25 11 - 51 U/L  ? ? ?Imaging:  ?[place] MFM OB DETAIL +14 WK ? ?Result Date: 05/15/2021 ?----------------------------------------------------------------------  OBSTETRICS REPORT                       (Signed Final 05/15/2021 12:06 pm) ---------------------------------------------------------------------- Patient Info  ID #:       [id number]                          D.O.B.:  03/23/1995 (25 yrs)  Name:       Krystal Hudson                  Visit Date: 05/14/2021 01:09 pm ---------------------------------------------------------------------- Performed By  Attending:        Ma Rings MD         Ref. Address:     [address]                                                             [place], [place]                                                             [id number]  Performed By:     Charlyne Petrin     [facility]  Referred By:      Brock Bad MD ---------------------------------------------------------------------- Orders  #  Description                           Code        Ordered By  1  [place] MFM OB DETAIL +14 WK               76811.01    Coral Ceo ----------------------------------------------------------------------  #  Order #                     Accession #                 Episode #  1  [id number]                   [id number]                 [id number] ---------------------------------------------------------------------- Indications  Obesity complicating pregnancy, second

## 2021-06-11 ENCOUNTER — Ambulatory Visit: Payer: 59 | Attending: Obstetrics

## 2021-06-11 ENCOUNTER — Ambulatory Visit: Payer: 59 | Admitting: *Deleted

## 2021-06-11 VITALS — BP 113/55 | HR 88

## 2021-06-11 DIAGNOSIS — O99212 Obesity complicating pregnancy, second trimester: Secondary | ICD-10-CM | POA: Insufficient documentation

## 2021-06-11 DIAGNOSIS — O34219 Maternal care for unspecified type scar from previous cesarean delivery: Secondary | ICD-10-CM | POA: Insufficient documentation

## 2021-06-11 DIAGNOSIS — O285 Abnormal chromosomal and genetic finding on antenatal screening of mother: Secondary | ICD-10-CM | POA: Diagnosis not present

## 2021-06-11 DIAGNOSIS — Z148 Genetic carrier of other disease: Secondary | ICD-10-CM | POA: Insufficient documentation

## 2021-06-11 DIAGNOSIS — O09292 Supervision of pregnancy with other poor reproductive or obstetric history, second trimester: Secondary | ICD-10-CM | POA: Diagnosis not present

## 2021-06-11 DIAGNOSIS — E669 Obesity, unspecified: Secondary | ICD-10-CM

## 2021-06-11 DIAGNOSIS — Z3A25 25 weeks gestation of pregnancy: Secondary | ICD-10-CM | POA: Diagnosis not present

## 2021-06-11 DIAGNOSIS — Z362 Encounter for other antenatal screening follow-up: Secondary | ICD-10-CM | POA: Diagnosis present

## 2021-06-11 DIAGNOSIS — Z3482 Encounter for supervision of other normal pregnancy, second trimester: Secondary | ICD-10-CM

## 2021-06-11 DIAGNOSIS — D563 Thalassemia minor: Secondary | ICD-10-CM | POA: Diagnosis not present

## 2021-06-11 DIAGNOSIS — Z8759 Personal history of other complications of pregnancy, childbirth and the puerperium: Secondary | ICD-10-CM | POA: Insufficient documentation

## 2021-06-12 ENCOUNTER — Other Ambulatory Visit: Payer: Self-pay | Admitting: *Deleted

## 2021-06-12 DIAGNOSIS — O283 Abnormal ultrasonic finding on antenatal screening of mother: Secondary | ICD-10-CM

## 2021-06-12 DIAGNOSIS — O09299 Supervision of pregnancy with other poor reproductive or obstetric history, unspecified trimester: Secondary | ICD-10-CM

## 2021-06-12 DIAGNOSIS — O34219 Maternal care for unspecified type scar from previous cesarean delivery: Secondary | ICD-10-CM

## 2021-06-12 DIAGNOSIS — Z6841 Body Mass Index (BMI) 40.0 and over, adult: Secondary | ICD-10-CM

## 2021-06-18 ENCOUNTER — Encounter: Payer: Self-pay | Admitting: Obstetrics

## 2021-06-18 ENCOUNTER — Other Ambulatory Visit: Payer: 59

## 2021-06-18 ENCOUNTER — Ambulatory Visit (INDEPENDENT_AMBULATORY_CARE_PROVIDER_SITE_OTHER): Payer: 59 | Admitting: Obstetrics

## 2021-06-18 ENCOUNTER — Encounter: Payer: 59 | Admitting: Obstetrics and Gynecology

## 2021-06-18 VITALS — BP 116/72 | HR 96 | Wt 307.0 lb

## 2021-06-18 DIAGNOSIS — Z3482 Encounter for supervision of other normal pregnancy, second trimester: Secondary | ICD-10-CM

## 2021-06-18 DIAGNOSIS — Z3A26 26 weeks gestation of pregnancy: Secondary | ICD-10-CM | POA: Diagnosis not present

## 2021-06-18 DIAGNOSIS — M549 Dorsalgia, unspecified: Secondary | ICD-10-CM

## 2021-06-18 DIAGNOSIS — O26619 Liver and biliary tract disorders in pregnancy, unspecified trimester: Secondary | ICD-10-CM

## 2021-06-18 DIAGNOSIS — Z98891 History of uterine scar from previous surgery: Secondary | ICD-10-CM

## 2021-06-18 DIAGNOSIS — K760 Fatty (change of) liver, not elsewhere classified: Secondary | ICD-10-CM

## 2021-06-18 MED ORDER — MISC. DEVICES MISC: 0 refills | Status: DC

## 2021-06-18 NOTE — Progress Notes (Signed)
Subjective:  ?Krystal Hudson is a 26 y.o. G2P1001 at [redacted]w[redacted]d being seen today for ongoing prenatal care.  She is currently monitored for the following issues for this low-risk pregnancy and has Maternal morbid obesity, antepartum (HCC); Supervision of normal pregnancy; History of cesarean section complicating pregnancy; and Hepatic steatosis during pregnancy on their problem list. ? ?Patient reports backache and heartburn.  Contractions: Not present. Vag. Bleeding: None.  Movement: Present. Denies leaking of fluid.  ? ?The following portions of the patient's history were reviewed and updated as appropriate: allergies, current medications, past family history, past medical history, past social history, past surgical history and problem list. Problem list updated. ? ?Objective:  ? ?Vitals:  ? 06/18/21 0824  ?BP: 116/72  ?Pulse: 96  ?Weight: (!) 307 lb (139.3 kg)  ? ? ?Fetal Status: Fetal Heart Rate (bpm): 154   Movement: Present    ? ?General:  Alert, oriented and cooperative. Patient is in no acute distress.  ?Skin: Skin is warm and dry. No rash noted.   ?Cardiovascular: Normal heart rate noted  ?Respiratory: Normal respiratory effort, no problems with respiration noted  ?Abdomen: Soft, gravid, appropriate for gestational age. Pain/Pressure: Absent     ?Pelvic:  Cervical exam deferred        ?Extremities: Normal range of motion.  Edema: None  ?Mental Status: Normal mood and affect. Normal behavior. Normal judgment and thought content.  ? ?Urinalysis:     ? ?Assessment and Plan:  ?Pregnancy: G2P1001 at [redacted]w[redacted]d ? ?1. Encounter for supervision of other normal pregnancy in second trimester ?Rx: ?- Glucose Tolerance, 2 Hours w/1 Hour ?- RPR ?- CBC ?- HIV antibody (with reflex) ?- Misc. Devices MISC; Dispense one maternity belt for patient  Dispense: 1 each; Refill: 0 ? ?2. Hepatic steatosis during pregnancy ?Rx: ?- Ambulatory referral to Gastroenterology ? ?3. Previous cesarean section ?- desires repeat C/S ? ?4. Backache  symptom ?- Maternity Belt Rx ? ?  ?Preterm labor symptoms and general obstetric precautions including but not limited to vaginal bleeding, contractions, leaking of fluid and fetal movement were reviewed in detail with the patient. ?Please refer to After Visit Summary for other counseling recommendations.  ? ?Return in about 2 weeks (around 07/02/2021). ? ? ?Brock Bad, MD  ?06/18/21  ?

## 2021-06-18 NOTE — Progress Notes (Signed)
ROB/GTT.  Declined TDAP vaccine, reports no complaints today. ?

## 2021-06-19 ENCOUNTER — Other Ambulatory Visit: Payer: Self-pay | Admitting: Obstetrics

## 2021-06-19 DIAGNOSIS — O99012 Anemia complicating pregnancy, second trimester: Secondary | ICD-10-CM

## 2021-06-19 LAB — CBC
Hematocrit: 32.7 % — ABNORMAL LOW (ref 34.0–46.6)
Hemoglobin: 10.9 g/dL — ABNORMAL LOW (ref 11.1–15.9)
MCH: 27.1 pg (ref 26.6–33.0)
MCHC: 33.3 g/dL (ref 31.5–35.7)
MCV: 81 fL (ref 79–97)
Platelets: 294 10*3/uL (ref 150–450)
RBC: 4.02 x10E6/uL (ref 3.77–5.28)
RDW: 13.7 % (ref 11.7–15.4)
WBC: 7.3 10*3/uL (ref 3.4–10.8)

## 2021-06-19 LAB — HIV ANTIBODY (ROUTINE TESTING W REFLEX): HIV Screen 4th Generation wRfx: NONREACTIVE

## 2021-06-19 LAB — GLUCOSE TOLERANCE, 2 HOURS W/ 1HR
Glucose, 1 hour: 138 mg/dL (ref 70–179)
Glucose, 2 hour: 93 mg/dL (ref 70–152)
Glucose, Fasting: 81 mg/dL (ref 70–91)

## 2021-06-19 LAB — RPR: RPR Ser Ql: NONREACTIVE

## 2021-06-19 MED ORDER — IRON POLYSACCH CMPLX-B12-FA 150-0.025-1 MG PO CAPS: 1.0000 | ORAL_CAPSULE | ORAL | 5 refills | Status: DC

## 2021-06-30 ENCOUNTER — Inpatient Hospital Stay (HOSPITAL_COMMUNITY)
Admission: AD | Admit: 2021-06-30 | Discharge: 2021-07-01 | Disposition: A | Discharge status: Home / Self Care | Payer: 59 | Attending: Obstetrics and Gynecology | Admitting: Obstetrics and Gynecology

## 2021-06-30 ENCOUNTER — Other Ambulatory Visit: Payer: Self-pay

## 2021-06-30 DIAGNOSIS — Z6790 Unspecified blood type, Rh positive: Secondary | ICD-10-CM | POA: Insufficient documentation

## 2021-06-30 DIAGNOSIS — O4692 Antepartum hemorrhage, unspecified, second trimester: Secondary | ICD-10-CM | POA: Insufficient documentation

## 2021-06-30 DIAGNOSIS — Z3A27 27 weeks gestation of pregnancy: Secondary | ICD-10-CM | POA: Insufficient documentation

## 2021-06-30 DIAGNOSIS — O4690 Antepartum hemorrhage, unspecified, unspecified trimester: Secondary | ICD-10-CM

## 2021-06-30 DIAGNOSIS — Z3689 Encounter for other specified antenatal screening: Secondary | ICD-10-CM

## 2021-06-30 DIAGNOSIS — O99212 Obesity complicating pregnancy, second trimester: Secondary | ICD-10-CM | POA: Insufficient documentation

## 2021-06-30 DIAGNOSIS — Z3482 Encounter for supervision of other normal pregnancy, second trimester: Secondary | ICD-10-CM

## 2021-06-30 DIAGNOSIS — O10912 Unspecified pre-existing hypertension complicating pregnancy, second trimester: Secondary | ICD-10-CM | POA: Insufficient documentation

## 2021-06-30 NOTE — MAU Note (Signed)
Krystal Hudson is a 26 y.o. at [redacted]w[redacted]d here in MAU reporting: Vaginal spotting when she used the bathroom and wiped. No LOF, mild cramping in middle of abdomen with standing. ?LMP: 12/18/20 ?Onset of complaint: 10:30pm ? ?Pain score: 3/10 ?Vitals:  ? 06/30/21 2321  ?BP: 122/64  ?Pulse: 95  ?Resp: 18  ?Temp: 97.8 ?F (36.6 ?C)  ?SpO2: 95%  ?   ?FHT:155 ? ?Lab orders placed from triage:  ?Urinalysis ?

## 2021-07-01 ENCOUNTER — Encounter (HOSPITAL_COMMUNITY): Payer: Self-pay | Admitting: Obstetrics and Gynecology

## 2021-07-01 ENCOUNTER — Telehealth (HOSPITAL_COMMUNITY): Payer: Self-pay | Admitting: General Practice

## 2021-07-01 DIAGNOSIS — Z3A27 27 weeks gestation of pregnancy: Secondary | ICD-10-CM | POA: Diagnosis not present

## 2021-07-01 DIAGNOSIS — O99212 Obesity complicating pregnancy, second trimester: Secondary | ICD-10-CM | POA: Diagnosis not present

## 2021-07-01 DIAGNOSIS — Z6790 Unspecified blood type, Rh positive: Secondary | ICD-10-CM | POA: Diagnosis not present

## 2021-07-01 DIAGNOSIS — O10912 Unspecified pre-existing hypertension complicating pregnancy, second trimester: Secondary | ICD-10-CM | POA: Diagnosis not present

## 2021-07-01 DIAGNOSIS — O4690 Antepartum hemorrhage, unspecified, unspecified trimester: Secondary | ICD-10-CM

## 2021-07-01 DIAGNOSIS — O4692 Antepartum hemorrhage, unspecified, second trimester: Secondary | ICD-10-CM | POA: Diagnosis not present

## 2021-07-01 LAB — URINALYSIS, ROUTINE W REFLEX MICROSCOPIC
Bilirubin Urine: NEGATIVE
Glucose, UA: NEGATIVE mg/dL
Hgb urine dipstick: NEGATIVE
Ketones, ur: 5 mg/dL — AB
Leukocytes,Ua: NEGATIVE
Nitrite: NEGATIVE
Protein, ur: NEGATIVE mg/dL
Specific Gravity, Urine: 1.034 — ABNORMAL HIGH (ref 1.005–1.030)
pH: 5 (ref 5.0–8.0)

## 2021-07-01 LAB — GC/CHLAMYDIA PROBE AMP (~~LOC~~) NOT AT ARMC
Chlamydia: NEGATIVE
Comment: NEGATIVE
Comment: NORMAL
Neisseria Gonorrhea: NEGATIVE

## 2021-07-01 LAB — WET PREP, GENITAL
Clue Cells Wet Prep HPF POC: NONE SEEN
Sperm: NONE SEEN
Trich, Wet Prep: NONE SEEN
WBC, Wet Prep HPF POC: >=10 — AB (ref <10)
Yeast Wet Prep HPF POC: NONE SEEN

## 2021-07-01 NOTE — MAU Provider Note (Signed)
?History  ?  ? ?CSN: [id number] ? ?Arrival date and time: 06/30/21 2314 ? ? Event Date/Time  ? First Provider Initiated Contact with Patient 06/30/21 2345   ?  ? ?Chief Complaint  ?Patient presents with  ? Vaginal Bleeding  ? ?[name] is a 26 y.o. G2P1001 at 80w6dwho presents to MAU for vaginal bleeding which began around 1030 this evening. Patient states she finished urinating and went to wipe and saw a streak of blood on the toilet paper mixed with mucus. Patient reports blood was bright red. Patient states she wiped a second time and the same thing occurred. Patient states she wiped a third time and saw no bleeding on the toilet paper. Patient denies cramping or contractions or recent intercourse. Patient states no bleeding since that time. ? ?Blood Type? O Positive ? ? ?OB History   ? ? Gravida  ?2  ? Para  ?1  ? Term  ?1  ? Preterm  ?   ? AB  ?   ? Living  ?1  ?  ? ? SAB  ?   ? IAB  ?   ? Ectopic  ?   ? Multiple  ?0  ? Live Births  ?1  ?   ?  ?  ? ? ?Past Medical History:  ?Diagnosis Date  ? Anemia   ? Hypertension   ? Morbid obesity ([place]   ? ? ?Past Surgical History:  ?Procedure Laterality Date  ? CESAREAN SECTION N/A 11/20/2018  ? Procedure: CESAREAN SECTION;  Surgeon: DEmily Filbert MD;  Location: [facility];  Service: Obstetrics;  Laterality: N/A;  ? TYMPANOSTOMY TUBE PLACEMENT    ? ? ?Family History  ?Problem Relation Age of Onset  ? Healthy Mother   ? Hypertension Maternal Grandmother   ? Hypertension Maternal Grandfather   ? ? ?Social History  ? ?Tobacco Use  ? Smoking status: Never  ? Smokeless tobacco: Never  ?Vaping Use  ? Vaping Use: Former  ? Quit date: 04/17/2018  ?Substance Use Topics  ? Alcohol use: Not Currently  ? Drug use: Never  ? ? ?Allergies: No Known Allergies ? ?Medications Prior to Admission  ?Medication Sig Dispense Refill Last Dose  ? aspirin 81 MG chewable tablet Chew 1 tablet (81 mg total) by mouth daily. 30 tablet 5   ? Blood Pressure Monitoring (BLOOD PRESSURE KIT) DEVI 1  Device by Does not apply route once a week. 1 each 0   ? famotidine (PEPCID) 20 MG tablet Take 1 tablet (20 mg total) by mouth 2 (two) times daily. 60 tablet 3   ? Iron Polysacch Cmplx-B12-FA 150-0.025-1 MG CAPS Take 1 capsule by mouth every other day. 30 capsule 5   ? Misc. Devices MISC Dispense one maternity belt for patient 1 each 0   ? ondansetron (ZOFRAN-ODT) 4 MG disintegrating tablet Take 1 tablet (4 mg total) by mouth every 6 (six) hours as needed for nausea or vomiting. 30 tablet 1   ? Prenat-Fe Poly-Methfol-FA-DHA (VITAFOL ULTRA) 29-0.6-0.4-200 MG CAPS Take 1 capsule by mouth daily before breakfast. 90 capsule 4   ? ? ?Review of Systems  ?Constitutional:  Negative for chills, diaphoresis, fatigue and fever.  ?Eyes:  Negative for visual disturbance.  ?Respiratory:  Negative for shortness of breath.   ?Cardiovascular:  Negative for chest pain.  ?Gastrointestinal:  Negative for abdominal pain, constipation, diarrhea, nausea and vomiting.  ?Genitourinary:  Positive for vaginal bleeding. Negative for dysuria, flank pain, frequency, pelvic  pain, urgency and vaginal discharge.  ?Neurological:  Negative for dizziness, weakness, light-headedness and headaches.  ? ?Physical Exam  ? ?Blood pressure 122/64, pulse 95, temperature 97.8 ?F (36.6 ?C), resp. rate 18, height 5' 4"  (1.626 m), weight (!) 141.1 kg, last menstrual period 12/18/2020, SpO2 95 %. ? ?Patient Vitals for the past 24 hrs: ? BP Temp Pulse Resp SpO2 Height Weight  ?06/30/21 2321 122/64 97.8 ?F (36.6 ?C) 95 18 95 % 5' 4"  (1.626 m) (!) 141.1 kg  ? ?Physical Exam ?Vitals and nursing note reviewed. Exam conducted with a chaperone present.  ?Constitutional:   ?   General: She is not in acute distress. ?   Appearance: Normal appearance. She is not ill-appearing, toxic-appearing or diaphoretic.  ?HENT:  ?   Head: Normocephalic and atraumatic.  ?Pulmonary:  ?   Effort: Pulmonary effort is normal.  ?Abdominal:  ?   Palpations: Abdomen is soft.  ?Genitourinary: ?    General: Normal vulva.  ?   Labia:     ?   Right: No rash, tenderness, lesion or injury.     ?   Left: No rash, tenderness, lesion or injury.   ?   Vagina: Bleeding present.  ?   Cervix: No friability, erythema or cervical bleeding.  ?   Comments: On initial speculum exam, smooth, white discharge noted and no evidence of bleeding in vagina or near cervix. AS speculum was opened, vaginal wall on maternal left started minimally bleeding near introitus. Suspect this as cause of bleeding given no other source and smooth, white discharge without brown or red discoloration. ?Skin: ?   General: Skin is warm and dry.  ?Neurological:  ?   Mental Status: She is alert and oriented to person, place, and time.  ?Psychiatric:     ?   Mood and Affect: Mood normal.     ?   Behavior: Behavior normal.     ?   Thought Content: Thought content normal.     ?   Judgment: Judgment normal.  ? ? ?MAU Course  ?Procedures ? ?MDM ?-VB per pt report ?-evidence of vaginal wall bleeding on exam, no evidence of uterine or cervical bleeding on exam ?-EFM: reactive ?      -baseline: 145 ?      -variability: moderate ?      -accels: present, 10x10 ?      -decels: absent ?      -TOCO: quiet ?-difficult to trace d/t maternal body habitus ?-WetPrEP: WNL ?-GC/CT collected ?-pt discharged to home in stable condition ? ?Assessment and Plan  ? ?1. Encounter for supervision of other normal pregnancy in second trimester   ?2. Vaginal bleeding in pregnancy   ?3. Blood type, Rh positive   ?4. [redacted] weeks gestation of pregnancy   ?5. NST (non-stress test) reactive   ? ?Allergies as of 07/01/2021   ?No Known Allergies ?  ? ?  ?Medication List  ?  ? ?TAKE these medications   ? ?aspirin 81 MG chewable tablet ?Chew 1 tablet (81 mg total) by mouth daily. ?  ?Blood Pressure Kit Devi ?1 Device by Does not apply route once a week. ?  ?famotidine 20 MG tablet ?Commonly known as: PEPCID ?Take 1 tablet (20 mg total) by mouth 2 (two) times daily. ?  ?Iron Polysacch  Cmplx-B12-FA 150-0.025-1 MG Caps ?Take 1 capsule by mouth every other day. ?  ?Misc. Devices Misc ?Dispense one maternity belt for patient ?  ?ondansetron 4 MG disintegrating  tablet ?Commonly known as: ZOFRAN-ODT ?Take 1 tablet (4 mg total) by mouth every 6 (six) hours as needed for nausea or vomiting. ?  ?Vitafol Ultra 29-0.6-0.4-200 MG Caps ?Take 1 capsule by mouth daily before breakfast. ?  ? ?  ? ?-return MAU precautions given ?-pt discharged to home in stable condition ? ?Elmyra Ricks E Greenlee Ancheta ?07/01/2021, 1:19 AM  ?

## 2021-07-02 ENCOUNTER — Ambulatory Visit (INDEPENDENT_AMBULATORY_CARE_PROVIDER_SITE_OTHER): Payer: 59 | Admitting: Obstetrics & Gynecology

## 2021-07-02 DIAGNOSIS — Z3480 Encounter for supervision of other normal pregnancy, unspecified trimester: Secondary | ICD-10-CM

## 2021-07-02 DIAGNOSIS — D563 Thalassemia minor: Secondary | ICD-10-CM

## 2021-07-02 NOTE — Progress Notes (Signed)
? ?  PRENATAL VISIT NOTE ? ?Subjective:  ?Krystal Hudson is a 26 y.o. G2P1001 at [redacted]w[redacted]d being seen today for ongoing prenatal care.  She is currently monitored for the following issues for this high-risk pregnancy and has Maternal morbid obesity, antepartum ([place]); Supervision of normal pregnancy; History of cesarean section complicating pregnancy; Hepatic steatosis during pregnancy; and Alpha thalassemia silent carrier on their problem list. ? ?Patient reports  she was seen in MAU last night for bleeding which has resolved .  Contractions: Not present. Vag. Bleeding: None.  Movement: Present. Denies leaking of fluid.  ? ?The following portions of the patient's history were reviewed and updated as appropriate: allergies, current medications, past family history, past medical history, past social history, past surgical history and problem list.  ? ?Objective:  ? ?Vitals:  ? 07/02/21 1634 07/02/21 1637  ?BP: (!) 153/76 127/84  ?Pulse: 83 90  ?Weight: (!) 309 lb (140.2 kg) (!) 309 lb (140.2 kg)  ? ? ?Fetal Status: Fetal Heart Rate (bpm): 149 Fundal Height: 29 cm Movement: Present    ? ?General:  Alert, oriented and cooperative. Patient is in no acute distress.  ?Skin: Skin is warm and dry. No rash noted.   ?Cardiovascular: Normal heart rate noted  ?Respiratory: Normal respiratory effort, no problems with respiration noted  ?Abdomen: Soft, gravid, appropriate for gestational age.  Pain/Pressure: Present     ?Pelvic: Cervical exam deferred        ?Extremities: Normal range of motion.  Edema: Trace  ?Mental Status: Normal mood and affect. Normal behavior. Normal judgment and thought content.  ? ?Assessment and Plan:  ?Pregnancy: G2P1001 at [redacted]w[redacted]d ?1. Alpha thalassemia silent carrier ? ? ?2. Supervision of other normal pregnancy, antepartum ?Examined for small vaginal bleeding in MAU. Has [place] f/u ? ?Preterm labor symptoms and general obstetric precautions including but not limited to vaginal bleeding, contractions, leaking of  fluid and fetal movement were reviewed in detail with the patient. ?Please refer to After Visit Summary for other counseling recommendations.  ? ?Return in about 2 weeks (around 07/16/2021). ? ?Future Appointments  ?Date Time Provider [place]  ?07/16/2021  3:30 PM [facility] NURSE [facility]  ?07/16/2021  3:45 PM [facility] US4 [facility]  ? ? ?Emeterio Reeve, MD ?

## 2021-07-16 ENCOUNTER — Ambulatory Visit: Payer: 59 | Admitting: *Deleted

## 2021-07-16 ENCOUNTER — Ambulatory Visit: Payer: 59 | Attending: Obstetrics

## 2021-07-16 VITALS — BP 114/58 | HR 81

## 2021-07-16 DIAGNOSIS — O34219 Maternal care for unspecified type scar from previous cesarean delivery: Secondary | ICD-10-CM | POA: Insufficient documentation

## 2021-07-16 DIAGNOSIS — Z3A30 30 weeks gestation of pregnancy: Secondary | ICD-10-CM | POA: Diagnosis not present

## 2021-07-16 DIAGNOSIS — O283 Abnormal ultrasonic finding on antenatal screening of mother: Secondary | ICD-10-CM | POA: Insufficient documentation

## 2021-07-16 DIAGNOSIS — O321XX0 Maternal care for breech presentation, not applicable or unspecified: Secondary | ICD-10-CM | POA: Insufficient documentation

## 2021-07-16 DIAGNOSIS — Z3483 Encounter for supervision of other normal pregnancy, third trimester: Secondary | ICD-10-CM | POA: Insufficient documentation

## 2021-07-16 DIAGNOSIS — O99213 Obesity complicating pregnancy, third trimester: Secondary | ICD-10-CM | POA: Insufficient documentation

## 2021-07-16 DIAGNOSIS — D563 Thalassemia minor: Secondary | ICD-10-CM

## 2021-07-16 DIAGNOSIS — O09293 Supervision of pregnancy with other poor reproductive or obstetric history, third trimester: Secondary | ICD-10-CM | POA: Insufficient documentation

## 2021-07-16 DIAGNOSIS — Z148 Genetic carrier of other disease: Secondary | ICD-10-CM | POA: Insufficient documentation

## 2021-07-16 DIAGNOSIS — O09299 Supervision of pregnancy with other poor reproductive or obstetric history, unspecified trimester: Secondary | ICD-10-CM | POA: Insufficient documentation

## 2021-07-16 DIAGNOSIS — E669 Obesity, unspecified: Secondary | ICD-10-CM

## 2021-07-16 DIAGNOSIS — O99891 Other specified diseases and conditions complicating pregnancy: Secondary | ICD-10-CM

## 2021-07-16 DIAGNOSIS — K831 Obstruction of bile duct: Secondary | ICD-10-CM

## 2021-07-16 DIAGNOSIS — O285 Abnormal chromosomal and genetic finding on antenatal screening of mother: Secondary | ICD-10-CM

## 2021-07-16 DIAGNOSIS — Z6841 Body Mass Index (BMI) 40.0 and over, adult: Secondary | ICD-10-CM | POA: Insufficient documentation

## 2021-07-17 ENCOUNTER — Other Ambulatory Visit: Payer: Self-pay | Admitting: *Deleted

## 2021-07-17 ENCOUNTER — Ambulatory Visit (INDEPENDENT_AMBULATORY_CARE_PROVIDER_SITE_OTHER): Payer: 59 | Admitting: Women's Health

## 2021-07-17 ENCOUNTER — Telehealth: Payer: 59 | Admitting: Obstetrics

## 2021-07-17 VITALS — BP 130/80 | HR 86 | Wt 313.0 lb

## 2021-07-17 DIAGNOSIS — O26619 Liver and biliary tract disorders in pregnancy, unspecified trimester: Secondary | ICD-10-CM

## 2021-07-17 DIAGNOSIS — O99213 Obesity complicating pregnancy, third trimester: Secondary | ICD-10-CM

## 2021-07-17 DIAGNOSIS — Z3A30 30 weeks gestation of pregnancy: Secondary | ICD-10-CM

## 2021-07-17 DIAGNOSIS — D563 Thalassemia minor: Secondary | ICD-10-CM

## 2021-07-17 DIAGNOSIS — K760 Fatty (change of) liver, not elsewhere classified: Secondary | ICD-10-CM

## 2021-07-17 DIAGNOSIS — O34219 Maternal care for unspecified type scar from previous cesarean delivery: Secondary | ICD-10-CM

## 2021-07-17 DIAGNOSIS — Z3483 Encounter for supervision of other normal pregnancy, third trimester: Secondary | ICD-10-CM

## 2021-07-17 DIAGNOSIS — O99210 Obesity complicating pregnancy, unspecified trimester: Secondary | ICD-10-CM

## 2021-07-17 NOTE — Patient Instructions (Signed)
Maternity Assessment Unit (MAU)  The Maternity Assessment Unit (MAU) is located at the [facility]. The address is: [address], [address], [place], [place] [address]. Please see map below for additional directions.    The Maternity Assessment Unit is designed to help you during your pregnancy, and for up to 6 weeks after delivery, with any pregnancy- or postpartum-related emergencies, if you think you are in labor, or if your water has broken. For example, if you experience nausea and vomiting, vaginal bleeding, severe abdominal or pelvic pain, elevated blood pressure or other problems related to your pregnancy or postpartum time, please come to the Maternity Assessment Unit for assistance.        

## 2021-07-17 NOTE — Progress Notes (Signed)
Subjective:  Krystal Hudson is a 26 y.o. G2P1001 at [redacted]w[redacted]d being seen today for ongoing prenatal care.  She is currently monitored for the following issues for this low-risk pregnancy and has Maternal morbid obesity, antepartum ([place]); Supervision of normal pregnancy; History of cesarean section complicating pregnancy; Hepatic steatosis during pregnancy; and Alpha thalassemia silent carrier on their problem list.  Patient reports no complaints.  Contractions: Not present. Vag. Bleeding: None.  Movement: Present. Denies leaking of fluid.   The following portions of the patient's history were reviewed and updated as appropriate: allergies, current medications, past family history, past medical history, past social history, past surgical history and problem list. Problem list updated.  Objective:   Vitals:   07/17/21 1630  BP: 130/80  Pulse: 86  Weight: (!) 313 lb (142 kg)    Fetal Status: Fetal Heart Rate (bpm): 151   Movement: Present     General:  Alert, oriented and cooperative. Patient is in no acute distress.  Skin: Skin is warm and dry. No rash noted.   Cardiovascular: Normal heart rate noted  Respiratory: Normal respiratory effort, no problems with respiration noted  Abdomen: Soft, gravid, appropriate for gestational age. Pain/Pressure: Absent     Pelvic: Vag. Bleeding: None     Cervical exam deferred        Extremities: Normal range of motion.  Edema: Trace  Mental Status: Normal mood and affect. Normal behavior. Normal judgment and thought content.   Urinalysis:      Assessment and Plan:  Pregnancy: G2P1001 at 108w1d  1. Encounter for supervision of other normal pregnancy in third trimester     06/18/2021    8:48 AM 03/26/2021   11:17 AM 01/20/2019    3:28 PM  PHQ9 SCORE ONLY  PHQ-9 Total Score 4 7 12       06/18/2021    8:49 AM 03/26/2021   11:18 AM 01/20/2019    3:30 PM 01/06/2019    3:57 PM  GAD 7 : Generalized Anxiety Score  Nervous, Anxious, on Edge 0 0 0 3   Control/stop worrying 0 1 1 1   Worry too much - different things 0 0 1 3  Trouble relaxing 0 0 0 1  Restless 0 0 0 3  Easily annoyed or irritable 1 2 1 1   Afraid - awful might happen 0 0 0 1  Total GAD 7 Score 1 3 3 13   Anxiety Difficulty Not difficult at all      2. History of cesarean section complicating pregnancy -planning repeat  3. Hepatic steatosis during pregnancy -GI appt not made - pt states she was never called, referral reentered today  4. Maternal morbid obesity, antepartum ([facility]) -BMI 50 -EFW 65% as of 07/16/2021 -weekly BPP starting 34 weeks -growth q4 weeks -PCr today for baseline PEC labs  5. Alpha thalassemia silent carrier  6. [redacted] weeks gestation of pregnancy  Preterm labor symptoms and general obstetric precautions including but not limited to vaginal bleeding, contractions, leaking of fluid and fetal movement were reviewed in detail with the patient. I discussed the assessment and treatment plan with the patient. The patient was provided an opportunity to ask questions and all were answered. The patient agreed with the plan and demonstrated an understanding of the instructions. The patient was advised to call back or seek an in-person office evaluation/go to MAU at [facility] for any urgent or concerning symptoms. Please refer to After Visit Summary for other counseling recommendations.  Return in about  2 weeks (around 07/31/2021) for in-person LOB/APP OK.   Erskine Steinfeldt, Gerrie Nordmann, NP

## 2021-07-18 LAB — PROTEIN / CREATININE RATIO, URINE
Creatinine, Urine: 173.7 mg/dL
Protein, Ur: 16.4 mg/dL
Protein/Creat Ratio: 94 mg/g creat (ref 0–200)

## 2021-07-31 ENCOUNTER — Ambulatory Visit (INDEPENDENT_AMBULATORY_CARE_PROVIDER_SITE_OTHER): Payer: 59 | Admitting: Obstetrics

## 2021-07-31 ENCOUNTER — Encounter: Payer: 59 | Admitting: Student

## 2021-07-31 VITALS — BP 118/77 | HR 85 | Wt 312.4 lb

## 2021-07-31 DIAGNOSIS — O34219 Maternal care for unspecified type scar from previous cesarean delivery: Secondary | ICD-10-CM

## 2021-07-31 DIAGNOSIS — Z3483 Encounter for supervision of other normal pregnancy, third trimester: Secondary | ICD-10-CM

## 2021-07-31 NOTE — Progress Notes (Signed)
Pt reports fetal movement with some pressure. 

## 2021-08-01 ENCOUNTER — Encounter: Payer: Self-pay | Admitting: Obstetrics

## 2021-08-01 NOTE — Progress Notes (Signed)
Subjective:  Krystal Hudson is a 26 y.o. G2P1001 at [redacted]w[redacted]d being seen today for ongoing prenatal care.  She is currently monitored for the following issues for this low-risk pregnancy and has Maternal morbid obesity, antepartum ([facility]); Supervision of normal pregnancy; History of cesarean section complicating pregnancy; Hepatic steatosis during pregnancy; and Alpha thalassemia silent carrier on their problem list.  Patient reports no complaints.  Contractions: Not present. Vag. Bleeding: None.  Movement: Present. Denies leaking of fluid.   The following portions of the patient's history were reviewed and updated as appropriate: allergies, current medications, past family history, past medical history, past social history, past surgical history and problem list. Problem list updated.  Objective:   Vitals:   07/31/21 1516  BP: 118/77  Pulse: 85  Weight: (!) 312 lb 6.4 oz (141.7 kg)    Fetal Status: Fetal Heart Rate (bpm): 144   Movement: Present     General:  Alert, oriented and cooperative. Patient is in no acute distress.  Skin: Skin is warm and dry. No rash noted.   Cardiovascular: Normal heart rate noted  Respiratory: Normal respiratory effort, no problems with respiration noted  Abdomen: Soft, gravid, appropriate for gestational age. Pain/Pressure: Present     Pelvic:  Cervical exam deferred        Extremities: Normal range of motion.  Edema: Trace  Mental Status: Normal mood and affect. Normal behavior. Normal judgment and thought content.   Urinalysis:      Assessment and Plan:  Pregnancy: G2P1001 at [redacted]w[redacted]d  1. Encounter for supervision of other normal pregnancy in third trimester  2. History of cesarean section complicating pregnancy - declines VBAC   Preterm labor symptoms and general obstetric precautions including but not limited to vaginal bleeding, contractions, leaking of fluid and fetal movement were reviewed in detail with the patient. Please refer to After Visit  Summary for other counseling recommendations.   Return in about 2 weeks (around 08/14/2021) for ROB.   Brock Bad, MD  08/01/21

## 2021-08-06 ENCOUNTER — Encounter (HOSPITAL_COMMUNITY): Payer: Self-pay | Admitting: Obstetrics and Gynecology

## 2021-08-06 ENCOUNTER — Inpatient Hospital Stay (HOSPITAL_COMMUNITY)
Admission: AD | Admit: 2021-08-06 | Discharge: 2021-08-06 | Disposition: A | Discharge status: Home / Self Care | Payer: 59 | Attending: Obstetrics and Gynecology | Admitting: Obstetrics and Gynecology

## 2021-08-06 ENCOUNTER — Telehealth: Payer: Self-pay

## 2021-08-06 DIAGNOSIS — Z3689 Encounter for other specified antenatal screening: Secondary | ICD-10-CM

## 2021-08-06 DIAGNOSIS — O42913 Preterm premature rupture of membranes, unspecified as to length of time between rupture and onset of labor, third trimester: Secondary | ICD-10-CM | POA: Insufficient documentation

## 2021-08-06 DIAGNOSIS — O26893 Other specified pregnancy related conditions, third trimester: Secondary | ICD-10-CM | POA: Insufficient documentation

## 2021-08-06 DIAGNOSIS — Z3A33 33 weeks gestation of pregnancy: Secondary | ICD-10-CM | POA: Insufficient documentation

## 2021-08-06 DIAGNOSIS — Z3483 Encounter for supervision of other normal pregnancy, third trimester: Secondary | ICD-10-CM

## 2021-08-06 DIAGNOSIS — R109 Unspecified abdominal pain: Secondary | ICD-10-CM | POA: Insufficient documentation

## 2021-08-06 DIAGNOSIS — O368130 Decreased fetal movements, third trimester, not applicable or unspecified: Secondary | ICD-10-CM | POA: Insufficient documentation

## 2021-08-06 DIAGNOSIS — Z3493 Encounter for supervision of normal pregnancy, unspecified, third trimester: Secondary | ICD-10-CM

## 2021-08-06 LAB — URINALYSIS, ROUTINE W REFLEX MICROSCOPIC
Bilirubin Urine: NEGATIVE
Glucose, UA: NEGATIVE mg/dL
Hgb urine dipstick: NEGATIVE
Ketones, ur: NEGATIVE mg/dL
Leukocytes,Ua: NEGATIVE
Nitrite: NEGATIVE
Protein, ur: NEGATIVE mg/dL
Specific Gravity, Urine: 1.009 (ref 1.005–1.030)
pH: 6 (ref 5.0–8.0)

## 2021-08-06 LAB — POCT FERN TEST: POCT Fern Test: NEGATIVE

## 2021-08-06 LAB — AMNISURE RUPTURE OF MEMBRANE (ROM) NOT AT ARMC: Amnisure ROM: NEGATIVE

## 2021-08-06 NOTE — Telephone Encounter (Signed)
Patient called and left a message on triage vm.   Attempted to contact patient. No answer. Left vm for patient to return call.  Patient is currently in patient.

## 2021-08-06 NOTE — Progress Notes (Signed)
Discharge instructions reviewed with patient, F/U with prenatal appointment as scheduled. Return to MAU for LOF, VB, decreased FM. Fetal Kick counts reviewed and patient verbalizes understanding of discharge. Ambulates off unit with no complaints.

## 2021-08-06 NOTE — MAU Provider Note (Signed)
History     CSN: [id number]  Arrival date and time: 08/06/21 1202   Event Date/Time   First Provider Initiated Contact with Patient 08/06/21 1338      Chief Complaint  Patient presents with   Decreased Fetal Movement   Abdominal Pain   Rupture of Membranes   HPI Krystal Hudson is a 26 y.o. G2P1001 at 101w0dwho presents to MAU with chief complaint of rupture of membranes. This is a new problem, onset around 0900 today. Patient denies continuous leaking of fluid. She is remote from sexual intercourse.  Abdominal pain This is a new problem, onset after initial sensation of leaking of fluid. Pain score is 4-5/10 and does not radiate beyond suprapubic region. She denies aggravating or alleviating factors. She has not taken medication or tried other treatments for this complaint.  Decreased Fetal Movement This is a new problem, onset today. Patient states her baby's busiest time of day is typically in the morning but that her baby also typically moves when monitors are applied.  She feels that degree of movement is less than normal and more difficulty to detect.   Dysuria and Urinary Frequency This is a new problem, recent onset. Patient reported these complaints to Triage RN but denied concern for UTI on CNM initial assessment. She denies urinary complaints.   OB History     Gravida  2   Para  1   Term  1   Preterm      AB      Living  1      SAB      IAB      Ectopic      Multiple  0   Live Births  1           Past Medical History:  Diagnosis Date   Anemia    Hypertension    Morbid obesity ([place]     Past Surgical History:  Procedure Laterality Date   CESAREAN SECTION N/A 11/20/2018   Procedure: CESAREAN SECTION;  Surgeon: DEmily Filbert MD;  Location: [facility];  Service: Obstetrics;  Laterality: N/A;   TYMPANOSTOMY TUBE PLACEMENT      Family History  Problem Relation Age of Onset   Healthy Mother    Hypertension Maternal Grandmother     Hypertension Maternal Grandfather     Social History   Tobacco Use   Smoking status: Never   Smokeless tobacco: Never  Vaping Use   Vaping Use: Former   Quit date: 04/17/2018  Substance Use Topics   Alcohol use: Not Currently   Drug use: Never    Allergies: No Known Allergies  Medications Prior to Admission  Medication Sig Dispense Refill Last Dose   aspirin 81 MG chewable tablet Chew 1 tablet (81 mg total) by mouth daily. 30 tablet 5 08/06/2021   Prenat-Fe Poly-Methfol-FA-DHA (VITAFOL ULTRA) 29-0.6-0.4-200 MG CAPS Take 1 capsule by mouth daily before breakfast. 90 capsule 4 08/06/2021   Blood Pressure Monitoring (BLOOD PRESSURE KIT) DEVI 1 Device by Does not apply route once a week. 1 each 0    famotidine (PEPCID) 20 MG tablet Take 1 tablet (20 mg total) by mouth 2 (two) times daily. 60 tablet 3    Iron Polysacch Cmplx-B12-FA 150-0.025-1 MG CAPS Take 1 capsule by mouth every other day. 30 capsule 5    Misc. Devices MISC Dispense one maternity belt for patient 1 each 0    ondansetron (ZOFRAN-ODT) 4 MG disintegrating tablet Take 1 tablet (4  mg total) by mouth every 6 (six) hours as needed for nausea or vomiting. 30 tablet 1     Review of Systems  Gastrointestinal:  Positive for abdominal pain.  Genitourinary:  Positive for frequency and vaginal discharge.  All other systems reviewed and are negative.  Physical Exam   Blood pressure (!) 124/56, pulse 99, temperature 98.3 F (36.8 C), temperature source Oral, resp. rate 20, last menstrual period 12/18/2020, SpO2 99 %.  Physical Exam Vitals and nursing note reviewed. Exam conducted with a chaperone present.  Constitutional:      Appearance: She is well-developed. She is obese.  Cardiovascular:     Rate and Rhythm: Normal rate.  Pulmonary:     Effort: Pulmonary effort is normal.  Abdominal:     Palpations: Abdomen is soft.     Tenderness: There is no abdominal tenderness.  Genitourinary:    Comments: Pelvic exam: External  genitalia normal, vaginal walls pink and well rugated, cervix visually closed, no lesions noted. Negative pooling   Skin:    Capillary Refill: Capillary refill takes less than 2 seconds.  Neurological:     Mental Status: She is alert and oriented to person, place, and time.  Psychiatric:        Mood and Affect: Mood normal.        Behavior: Behavior normal.     MAU Course  Procedures  MDM - EMR reviewed. Hx of term SVD with same partner - Reactive tracing: baseline 140, mod var, + accels, no decels - Toco: UI - DFM 2/2 anterior placenta. Preemptive discussion with patient that when she is closer to term she might have BPP as part of workup for DFM. Not indicated today given reactive NST and GA  Orders Placed This Encounter  Procedures   Urinalysis, Routine w reflex microscopic Urine, Clean Catch   Amnisure rupture of membrane (rom)not at [place]   Discharge patient   Patient Vitals for the past 24 hrs:  BP Temp Temp src Pulse Resp SpO2  08/06/21 1346 128/66 -- -- 87 -- --  08/06/21 1345 -- 97.7 F (36.5 C) Oral -- 20 97 %  08/06/21 1232 (!) 124/56 98.3 F (36.8 C) Oral 99 20 99 %   Results for orders placed or performed during the hospital encounter of 08/06/21 (from the past 24 hour(s))  Urinalysis, Routine w reflex microscopic Urine, Clean Catch     Status: Abnormal   Collection Time: 08/06/21  1:10 PM  Result Value Ref Range   Color, Urine YELLOW YELLOW   APPearance HAZY (A) CLEAR   Specific Gravity, Urine 1.009 1.005 - 1.030   pH 6.0 5.0 - 8.0   Glucose, UA NEGATIVE NEGATIVE mg/dL   Hgb urine dipstick NEGATIVE NEGATIVE   Bilirubin Urine NEGATIVE NEGATIVE   Ketones, ur NEGATIVE NEGATIVE mg/dL   Protein, ur NEGATIVE NEGATIVE mg/dL   Nitrite NEGATIVE NEGATIVE   Leukocytes,Ua NEGATIVE NEGATIVE  Amnisure rupture of membrane (rom)not at [facility]     Status: None   Collection Time: 08/06/21  1:10 PM  Result Value Ref Range   Amnisure ROM NEGATIVE   Fern Test      Status: None   Collection Time: 08/06/21  1:47 PM  Result Value Ref Range   POCT Fern Test Negative = intact amniotic membranes    Assessment and Plan  --26 y.o. G2P1001 at [redacted]w[redacted]d --Reactive tracing --Closed cervix --Intact amniotic sac --Pubic symphysis pain --Discharge home in stable condition  [facility]  Chong Sicilian, [place], MSN, CNM 08/06/2021, 6:03 PM

## 2021-08-06 NOTE — MAU Note (Signed)
.  Krystal Hudson is a 26 y.o. at [redacted]w[redacted]d here in MAU reporting: leaking clear fluid since 0900 this morning. She states it is on and off. She also reports burning and frequency while voiding. Denies VB. Reports DFM today.   Pain score: 5  Lab orders placed from triage:  UA

## 2021-08-13 ENCOUNTER — Ambulatory Visit: Payer: 59 | Admitting: *Deleted

## 2021-08-13 ENCOUNTER — Ambulatory Visit: Payer: 59 | Attending: Obstetrics and Gynecology

## 2021-08-13 VITALS — BP 117/66 | HR 93

## 2021-08-13 DIAGNOSIS — Z148 Genetic carrier of other disease: Secondary | ICD-10-CM | POA: Diagnosis not present

## 2021-08-13 DIAGNOSIS — D563 Thalassemia minor: Secondary | ICD-10-CM

## 2021-08-13 DIAGNOSIS — E669 Obesity, unspecified: Secondary | ICD-10-CM

## 2021-08-13 DIAGNOSIS — Z3A34 34 weeks gestation of pregnancy: Secondary | ICD-10-CM | POA: Diagnosis not present

## 2021-08-13 DIAGNOSIS — O99213 Obesity complicating pregnancy, third trimester: Secondary | ICD-10-CM

## 2021-08-13 DIAGNOSIS — O26619 Liver and biliary tract disorders in pregnancy, unspecified trimester: Secondary | ICD-10-CM | POA: Insufficient documentation

## 2021-08-13 DIAGNOSIS — O09293 Supervision of pregnancy with other poor reproductive or obstetric history, third trimester: Secondary | ICD-10-CM

## 2021-08-13 DIAGNOSIS — O285 Abnormal chromosomal and genetic finding on antenatal screening of mother: Secondary | ICD-10-CM

## 2021-08-13 DIAGNOSIS — O26613 Liver and biliary tract disorders in pregnancy, third trimester: Secondary | ICD-10-CM | POA: Diagnosis not present

## 2021-08-13 DIAGNOSIS — K760 Fatty (change of) liver, not elsewhere classified: Secondary | ICD-10-CM

## 2021-08-13 DIAGNOSIS — O34219 Maternal care for unspecified type scar from previous cesarean delivery: Secondary | ICD-10-CM

## 2021-08-13 DIAGNOSIS — Z3483 Encounter for supervision of other normal pregnancy, third trimester: Secondary | ICD-10-CM | POA: Insufficient documentation

## 2021-08-14 ENCOUNTER — Ambulatory Visit (INDEPENDENT_AMBULATORY_CARE_PROVIDER_SITE_OTHER): Payer: 59 | Admitting: Obstetrics and Gynecology

## 2021-08-14 ENCOUNTER — Encounter: Payer: 59 | Admitting: Student

## 2021-08-14 VITALS — BP 127/79 | HR 86 | Wt 320.0 lb

## 2021-08-14 DIAGNOSIS — Z3483 Encounter for supervision of other normal pregnancy, third trimester: Secondary | ICD-10-CM

## 2021-08-14 DIAGNOSIS — O99210 Obesity complicating pregnancy, unspecified trimester: Secondary | ICD-10-CM

## 2021-08-14 DIAGNOSIS — O26619 Liver and biliary tract disorders in pregnancy, unspecified trimester: Secondary | ICD-10-CM

## 2021-08-14 DIAGNOSIS — K760 Fatty (change of) liver, not elsewhere classified: Secondary | ICD-10-CM

## 2021-08-14 DIAGNOSIS — Z3A34 34 weeks gestation of pregnancy: Secondary | ICD-10-CM

## 2021-08-14 DIAGNOSIS — O34219 Maternal care for unspecified type scar from previous cesarean delivery: Secondary | ICD-10-CM

## 2021-08-14 NOTE — Patient Instructions (Signed)

## 2021-08-14 NOTE — Progress Notes (Signed)
Subjective:  Krystal Hudson is a 26 y.o. G2P1001 at [redacted]w[redacted]d being seen today for ongoing prenatal care.  She is currently monitored for the following issues for this high-risk pregnancy and has Maternal morbid obesity, antepartum (HCC); Supervision of normal pregnancy; History of cesarean section complicating pregnancy; Hepatic steatosis during pregnancy; and Alpha thalassemia silent carrier on their problem list.  Patient reports general discomforts of pregnancy.  Contractions: Irritability. Vag. Bleeding: None.  Movement: Present. Denies leaking of fluid.   The following portions of the patient's history were reviewed and updated as appropriate: allergies, current medications, past family history, past medical history, past social history, past surgical history and problem list. Problem list updated.  Objective:   Vitals:   08/14/21 1626  BP: 127/79  Pulse: 86  Weight: (!) 320 lb (145.2 kg)    Fetal Status:     Movement: Present     General:  Alert, oriented and cooperative. Patient is in no acute distress.  Skin: Skin is warm and dry. No rash noted.   Cardiovascular: Normal heart rate noted  Respiratory: Normal respiratory effort, no problems with respiration noted  Abdomen: Soft, gravid, appropriate for gestational age. Pain/Pressure: Present     Pelvic:  Cervical exam deferred        Extremities: Normal range of motion.  Edema: Trace  Mental Status: Normal mood and affect. Normal behavior. Normal judgment and thought content.   Urinalysis:      Assessment and Plan:  Pregnancy: G2P1001 at [redacted]w[redacted]d  1. Encounter for supervision of other normal pregnancy in third trimester Stable   2. History of cesarean section complicating pregnancy For repeat at 39 weeks   3. Hepatic steatosis during pregnancy Has been referred to GI  4. Maternal morbid obesity, antepartum (HCC) Serial growth scans and antenatal testing as per MFM 96 % growth on 08/13/21  Preterm labor symptoms and  general obstetric precautions including but not limited to vaginal bleeding, contractions, leaking of fluid and fetal movement were reviewed in detail with the patient. Please refer to After Visit Summary for other counseling recommendations.  Return in about 2 weeks (around 08/28/2021) for OB visit, face to face, MD only.   Hermina Staggers, MD

## 2021-08-14 NOTE — Progress Notes (Signed)
Pt states increase in numbness in hands/feet and fatigue. Pt has been having daily HA's x 1 week -  No relief with Tylenol.

## 2021-08-15 ENCOUNTER — Other Ambulatory Visit: Payer: Self-pay | Admitting: Family Medicine

## 2021-08-15 DIAGNOSIS — Z3483 Encounter for supervision of other normal pregnancy, third trimester: Secondary | ICD-10-CM

## 2021-08-15 DIAGNOSIS — O34219 Maternal care for unspecified type scar from previous cesarean delivery: Secondary | ICD-10-CM

## 2021-08-21 ENCOUNTER — Ambulatory Visit: Payer: Commercial Managed Care - HMO | Admitting: *Deleted

## 2021-08-21 ENCOUNTER — Ambulatory Visit: Payer: Commercial Managed Care - HMO | Attending: Obstetrics and Gynecology

## 2021-08-21 VITALS — BP 116/68 | HR 78

## 2021-08-21 DIAGNOSIS — O34219 Maternal care for unspecified type scar from previous cesarean delivery: Secondary | ICD-10-CM | POA: Diagnosis not present

## 2021-08-21 DIAGNOSIS — O99891 Other specified diseases and conditions complicating pregnancy: Secondary | ICD-10-CM | POA: Diagnosis not present

## 2021-08-21 DIAGNOSIS — O26619 Liver and biliary tract disorders in pregnancy, unspecified trimester: Secondary | ICD-10-CM | POA: Diagnosis not present

## 2021-08-21 DIAGNOSIS — O09293 Supervision of pregnancy with other poor reproductive or obstetric history, third trimester: Secondary | ICD-10-CM | POA: Diagnosis not present

## 2021-08-21 DIAGNOSIS — E669 Obesity, unspecified: Secondary | ICD-10-CM | POA: Diagnosis not present

## 2021-08-21 DIAGNOSIS — D563 Thalassemia minor: Secondary | ICD-10-CM

## 2021-08-21 DIAGNOSIS — K760 Fatty (change of) liver, not elsewhere classified: Secondary | ICD-10-CM

## 2021-08-21 DIAGNOSIS — Z148 Genetic carrier of other disease: Secondary | ICD-10-CM | POA: Insufficient documentation

## 2021-08-21 DIAGNOSIS — Z3483 Encounter for supervision of other normal pregnancy, third trimester: Secondary | ICD-10-CM

## 2021-08-21 DIAGNOSIS — O285 Abnormal chromosomal and genetic finding on antenatal screening of mother: Secondary | ICD-10-CM

## 2021-08-21 DIAGNOSIS — O99213 Obesity complicating pregnancy, third trimester: Secondary | ICD-10-CM | POA: Diagnosis not present

## 2021-08-21 DIAGNOSIS — Z3A35 35 weeks gestation of pregnancy: Secondary | ICD-10-CM | POA: Diagnosis not present

## 2021-08-22 ENCOUNTER — Other Ambulatory Visit: Payer: Self-pay | Admitting: *Deleted

## 2021-08-22 DIAGNOSIS — O34219 Maternal care for unspecified type scar from previous cesarean delivery: Secondary | ICD-10-CM

## 2021-08-22 DIAGNOSIS — O09299 Supervision of pregnancy with other poor reproductive or obstetric history, unspecified trimester: Secondary | ICD-10-CM

## 2021-08-27 ENCOUNTER — Ambulatory Visit: Payer: 59 | Attending: Obstetrics and Gynecology

## 2021-08-27 ENCOUNTER — Ambulatory Visit: Payer: 59 | Admitting: *Deleted

## 2021-08-27 VITALS — BP 116/61 | HR 84

## 2021-08-27 DIAGNOSIS — E669 Obesity, unspecified: Secondary | ICD-10-CM | POA: Diagnosis not present

## 2021-08-27 DIAGNOSIS — O34219 Maternal care for unspecified type scar from previous cesarean delivery: Secondary | ICD-10-CM | POA: Insufficient documentation

## 2021-08-27 DIAGNOSIS — Z3A36 36 weeks gestation of pregnancy: Secondary | ICD-10-CM | POA: Diagnosis not present

## 2021-08-27 DIAGNOSIS — O99213 Obesity complicating pregnancy, third trimester: Secondary | ICD-10-CM | POA: Insufficient documentation

## 2021-08-27 DIAGNOSIS — Z3483 Encounter for supervision of other normal pregnancy, third trimester: Secondary | ICD-10-CM

## 2021-08-27 DIAGNOSIS — O09293 Supervision of pregnancy with other poor reproductive or obstetric history, third trimester: Secondary | ICD-10-CM | POA: Diagnosis not present

## 2021-08-27 DIAGNOSIS — O26619 Liver and biliary tract disorders in pregnancy, unspecified trimester: Secondary | ICD-10-CM | POA: Diagnosis not present

## 2021-08-27 DIAGNOSIS — K760 Fatty (change of) liver, not elsewhere classified: Secondary | ICD-10-CM | POA: Diagnosis not present

## 2021-08-27 DIAGNOSIS — D563 Thalassemia minor: Secondary | ICD-10-CM | POA: Insufficient documentation

## 2021-08-28 ENCOUNTER — Encounter: Payer: Self-pay | Admitting: Student

## 2021-08-28 ENCOUNTER — Encounter: Payer: 59 | Admitting: Student

## 2021-08-28 ENCOUNTER — Ambulatory Visit (INDEPENDENT_AMBULATORY_CARE_PROVIDER_SITE_OTHER): Payer: 59 | Admitting: Student

## 2021-08-28 ENCOUNTER — Other Ambulatory Visit (HOSPITAL_COMMUNITY)
Admission: RE | Admit: 2021-08-28 | Discharge: 2021-08-28 | Disposition: A | Discharge status: Home / Self Care | Payer: Commercial Managed Care - HMO | Source: Ambulatory Visit | Attending: Student | Admitting: Student

## 2021-08-28 VITALS — BP 135/83 | HR 107 | Wt 321.6 lb

## 2021-08-28 DIAGNOSIS — Z3A36 36 weeks gestation of pregnancy: Secondary | ICD-10-CM

## 2021-08-28 DIAGNOSIS — Z3483 Encounter for supervision of other normal pregnancy, third trimester: Secondary | ICD-10-CM | POA: Diagnosis not present

## 2021-08-28 DIAGNOSIS — O34219 Maternal care for unspecified type scar from previous cesarean delivery: Secondary | ICD-10-CM

## 2021-08-28 DIAGNOSIS — O99210 Obesity complicating pregnancy, unspecified trimester: Secondary | ICD-10-CM

## 2021-08-28 NOTE — Progress Notes (Signed)
   PRENATAL VISIT NOTE  Subjective:  Krystal Hudson is a 26 y.o. G2P1001 at [redacted]w[redacted]d being seen today for ongoing prenatal care.  She is currently monitored for the following issues for this high-risk pregnancy and has Maternal morbid obesity, antepartum (HCC); Supervision of normal pregnancy; History of cesarean section complicating pregnancy; Hepatic steatosis during pregnancy; and Alpha thalassemia silent carrier on their problem list.  Patient reports no complaints.  Contractions: Irregular. Vag. Bleeding: None.  Movement: Present. Denies leaking of fluid.   The following portions of the patient's history were reviewed and updated as appropriate: allergies, current medications, past family history, past medical history, past social history, past surgical history and problem list.   Objective:   Vitals:   08/28/21 1615  BP: 135/83  Pulse: (!) 107  Weight: (!) 321 lb 9.6 oz (145.9 kg)    Fetal Status: Fetal Heart Rate (bpm): 147   Movement: Present     General:  Alert, oriented and cooperative. Patient is in no acute distress.  Skin: Skin is warm and dry. No rash noted.   Cardiovascular: Normal heart rate noted  Respiratory: Normal respiratory effort, no problems with respiration noted  Abdomen: Soft, gravid, appropriate for gestational age.  Pain/Pressure: Present     Pelvic: Cervical exam deferred        Extremities: Normal range of motion.  Edema: Trace  Mental Status: Normal mood and affect. Normal behavior. Normal judgment and thought content.   Assessment and Plan:  Pregnancy: G2P1001 at [redacted]w[redacted]d 1. Encounter for supervision of other normal pregnancy in third trimester - Doing well, vigorous fetal movement  - Cervicovaginal ancillary only( [place]) - Culture, beta strep (group b only)  2. [redacted] weeks gestation of pregnancy - anticipatory guidance provided for follow-up visits - Discussed plan for contraception management post delivery. Patient desires BTL and has not been  consented. Provided education on options that support breastfeeding goals. Patient had poor experience with copper IUD due to heavy bleeding. Patient undecided, but leaning towards LNG-IUD or POPs postpartum.  3. History of cesarean section complicating pregnancy - Plan for scheduled c/section at 39 weeks, currently scheduled  4. Maternal morbid obesity, antepartum (HCC) - weekly BPPs and f/u US scheduled, per MFM recommendations    Preterm labor symptoms and general obstetric precautions including but not limited to vaginal bleeding, contractions, leaking of fluid and fetal movement were reviewed in detail with the patient. Please refer to After Visit Summary for other counseling recommendations.   Return in about 1 week (around 09/04/2021) for [facility], IN-PERSON.  Future Appointments  Date Time Provider Department Center  09/03/2021  1:45 PM [facility] NURSE [facility]  09/03/2021  2:00 PM [facility] US1 [facility]  09/05/2021  3:50 PM Corlis Hove, NP [facility] None  09/10/2021  3:30 PM [facility] NURSE [facility]  09/10/2021  3:45 PM [facility] US1 [facility]    Corlis Hove, NP

## 2021-08-30 LAB — CERVICOVAGINAL ANCILLARY ONLY
Bacterial Vaginitis (gardnerella): NEGATIVE
Candida Glabrata: NEGATIVE
Candida Vaginitis: NEGATIVE
Chlamydia: NEGATIVE
Comment: NEGATIVE
Comment: NEGATIVE
Comment: NEGATIVE
Comment: NEGATIVE
Comment: NEGATIVE
Comment: NORMAL
Neisseria Gonorrhea: NEGATIVE
Trichomonas: NEGATIVE

## 2021-08-31 LAB — CULTURE, BETA STREP (GROUP B ONLY): Strep Gp B Culture: POSITIVE — AB

## 2021-09-02 ENCOUNTER — Encounter (HOSPITAL_COMMUNITY): Payer: Self-pay

## 2021-09-02 ENCOUNTER — Telehealth (HOSPITAL_COMMUNITY): Payer: Self-pay | Admitting: *Deleted

## 2021-09-02 NOTE — Telephone Encounter (Signed)
Preadmission screen  

## 2021-09-02 NOTE — Patient Instructions (Signed)
Krystal Hudson  09/02/2021   Your procedure is scheduled on:  09/17/2021  Arrive at 0730 at Entrance C on [address] at [facility]  and [address]. You are invited to use the FREE valet parking or use the Visitor's parking deck.  Pick up the phone at the desk and dial [phone].  Call this number if you have problems the morning of surgery: [phone]  Remember:   Do not eat food:(After Midnight) Desps de medianoche.  Do not drink clear liquids: (After Midnight) Desps de medianoche.  Take these medicines the morning of surgery with A SIP OF WATER:  none   Do not wear jewelry, make-up or nail polish.  Do not wear lotions, powders, or perfumes. Do not wear deodorant.  Do not shave 48 hours prior to surgery.  Do not bring valuables to the hospital.  [facility] is not   responsible for any belongings or valuables brought to the hospital.  Contacts, dentures or bridgework may not be worn into surgery.  Leave suitcase in the car. After surgery it may be brought to your room.  For patients admitted to the hospital, checkout time is 11:00 AM the day of              discharge.      Please read over the following fact sheets that you were given:     Preparing for Surgery

## 2021-09-03 ENCOUNTER — Ambulatory Visit: Payer: Commercial Managed Care - HMO | Attending: Maternal & Fetal Medicine

## 2021-09-03 ENCOUNTER — Ambulatory Visit: Payer: Commercial Managed Care - HMO | Admitting: *Deleted

## 2021-09-03 VITALS — BP 111/65 | HR 89

## 2021-09-03 DIAGNOSIS — O285 Abnormal chromosomal and genetic finding on antenatal screening of mother: Secondary | ICD-10-CM

## 2021-09-03 DIAGNOSIS — Z148 Genetic carrier of other disease: Secondary | ICD-10-CM | POA: Diagnosis not present

## 2021-09-03 DIAGNOSIS — D563 Thalassemia minor: Secondary | ICD-10-CM

## 2021-09-03 DIAGNOSIS — O09293 Supervision of pregnancy with other poor reproductive or obstetric history, third trimester: Secondary | ICD-10-CM | POA: Diagnosis not present

## 2021-09-03 DIAGNOSIS — O34219 Maternal care for unspecified type scar from previous cesarean delivery: Secondary | ICD-10-CM | POA: Diagnosis not present

## 2021-09-03 DIAGNOSIS — K760 Fatty (change of) liver, not elsewhere classified: Secondary | ICD-10-CM | POA: Diagnosis not present

## 2021-09-03 DIAGNOSIS — Z3483 Encounter for supervision of other normal pregnancy, third trimester: Secondary | ICD-10-CM

## 2021-09-03 DIAGNOSIS — E669 Obesity, unspecified: Secondary | ICD-10-CM | POA: Diagnosis not present

## 2021-09-03 DIAGNOSIS — O99613 Diseases of the digestive system complicating pregnancy, third trimester: Secondary | ICD-10-CM

## 2021-09-03 DIAGNOSIS — O09299 Supervision of pregnancy with other poor reproductive or obstetric history, unspecified trimester: Secondary | ICD-10-CM | POA: Insufficient documentation

## 2021-09-03 DIAGNOSIS — O99213 Obesity complicating pregnancy, third trimester: Secondary | ICD-10-CM | POA: Diagnosis not present

## 2021-09-03 DIAGNOSIS — Z3A37 37 weeks gestation of pregnancy: Secondary | ICD-10-CM | POA: Insufficient documentation

## 2021-09-05 ENCOUNTER — Telehealth: Payer: Self-pay | Admitting: Emergency Medicine

## 2021-09-05 ENCOUNTER — Encounter: Payer: 59 | Admitting: Student

## 2021-09-05 ENCOUNTER — Encounter (HOSPITAL_COMMUNITY): Payer: Self-pay | Admitting: Family Medicine

## 2021-09-05 ENCOUNTER — Inpatient Hospital Stay (HOSPITAL_COMMUNITY)
Admission: AD | Admit: 2021-09-05 | Discharge: 2021-09-05 | Disposition: A | Discharge status: Home / Self Care | Payer: Commercial Managed Care - HMO | Attending: Family Medicine | Admitting: Family Medicine

## 2021-09-05 DIAGNOSIS — Z5986 Financial insecurity: Secondary | ICD-10-CM | POA: Diagnosis not present

## 2021-09-05 DIAGNOSIS — R519 Headache, unspecified: Secondary | ICD-10-CM | POA: Diagnosis not present

## 2021-09-05 DIAGNOSIS — H538 Other visual disturbances: Secondary | ICD-10-CM | POA: Insufficient documentation

## 2021-09-05 DIAGNOSIS — Z3A37 37 weeks gestation of pregnancy: Secondary | ICD-10-CM | POA: Insufficient documentation

## 2021-09-05 DIAGNOSIS — O09293 Supervision of pregnancy with other poor reproductive or obstetric history, third trimester: Secondary | ICD-10-CM | POA: Diagnosis not present

## 2021-09-05 DIAGNOSIS — Z5982 Transportation insecurity: Secondary | ICD-10-CM | POA: Insufficient documentation

## 2021-09-05 DIAGNOSIS — O26893 Other specified pregnancy related conditions, third trimester: Secondary | ICD-10-CM | POA: Diagnosis not present

## 2021-09-05 DIAGNOSIS — M545 Low back pain, unspecified: Secondary | ICD-10-CM | POA: Diagnosis not present

## 2021-09-05 DIAGNOSIS — R1084 Generalized abdominal pain: Secondary | ICD-10-CM

## 2021-09-05 HISTORY — DX: Headache, unspecified: R51.9

## 2021-09-05 LAB — CBC WITH DIFFERENTIAL/PLATELET
Abs Immature Granulocytes: 0.08 10*3/uL — ABNORMAL HIGH (ref 0.00–0.07)
Basophils Absolute: 0 10*3/uL (ref 0.0–0.1)
Basophils Relative: 0 %
Eosinophils Absolute: 0 10*3/uL (ref 0.0–0.5)
Eosinophils Relative: 0 %
HCT: 31.1 % — ABNORMAL LOW (ref 36.0–46.0)
Hemoglobin: 9.8 g/dL — ABNORMAL LOW (ref 12.0–15.0)
Immature Granulocytes: 1 %
Lymphocytes Relative: 21 %
Lymphs Abs: 1.8 10*3/uL (ref 0.7–4.0)
MCH: 24.4 pg — ABNORMAL LOW (ref 26.0–34.0)
MCHC: 31.5 g/dL (ref 30.0–36.0)
MCV: 77.6 fL — ABNORMAL LOW (ref 80.0–100.0)
Monocytes Absolute: 0.5 10*3/uL (ref 0.1–1.0)
Monocytes Relative: 6 %
Neutro Abs: 5.9 10*3/uL (ref 1.7–7.7)
Neutrophils Relative %: 72 %
Platelets: 271 10*3/uL (ref 150–400)
RBC: 4.01 MIL/uL (ref 3.87–5.11)
RDW: 15.9 % — ABNORMAL HIGH (ref 11.5–15.5)
WBC: 8.3 10*3/uL (ref 4.0–10.5)
nRBC: 0 % (ref 0.0–0.2)

## 2021-09-05 LAB — COMPREHENSIVE METABOLIC PANEL
ALT: 17 U/L (ref 0–44)
AST: 19 U/L (ref 15–41)
Albumin: 2.5 g/dL — ABNORMAL LOW (ref 3.5–5.0)
Alkaline Phosphatase: 118 U/L (ref 38–126)
Anion gap: 7 (ref 5–15)
BUN: 8 mg/dL (ref 6–20)
CO2: 22 mmol/L (ref 22–32)
Calcium: 8.4 mg/dL — ABNORMAL LOW (ref 8.9–10.3)
Chloride: 109 mmol/L (ref 98–111)
Creatinine, Ser: 0.91 mg/dL (ref 0.44–1.00)
GFR, Estimated: >60 mL/min (ref >60)
Glucose, Bld: 89 mg/dL (ref 70–99)
Potassium: 4 mmol/L (ref 3.5–5.1)
Sodium: 138 mmol/L (ref 135–145)
Total Bilirubin: 0.2 mg/dL — ABNORMAL LOW (ref 0.3–1.2)
Total Protein: 6.1 g/dL — ABNORMAL LOW (ref 6.5–8.1)

## 2021-09-05 LAB — URINALYSIS, ROUTINE W REFLEX MICROSCOPIC
Bilirubin Urine: NEGATIVE
Glucose, UA: NEGATIVE mg/dL
Hgb urine dipstick: NEGATIVE
Ketones, ur: NEGATIVE mg/dL
Leukocytes,Ua: NEGATIVE
Nitrite: NEGATIVE
Protein, ur: NEGATIVE mg/dL
Specific Gravity, Urine: 1.024 (ref 1.005–1.030)
pH: 5 (ref 5.0–8.0)

## 2021-09-05 LAB — PROTEIN / CREATININE RATIO, URINE
Creatinine, Urine: 368 mg/dL
Protein Creatinine Ratio: 0.04 mg/mg{Cre} (ref 0.00–0.15)
Total Protein, Urine: 15 mg/dL

## 2021-09-05 MED ORDER — CYCLOBENZAPRINE HCL 5 MG PO TABS
10.0000 mg | ORAL_TABLET | Freq: Once | ORAL | Status: AC
  Administered 2021-09-05: 10 mg via ORAL
  Filled 2021-09-05: qty 2

## 2021-09-05 MED ORDER — CYCLOBENZAPRINE HCL 10 MG PO TABS: 10.0000 mg | ORAL_TABLET | Freq: Three times a day (TID) | ORAL | 0 refills | Status: DC | PRN

## 2021-09-05 MED ORDER — LACTATED RINGERS IV BOLUS
1000.0000 mL | Freq: Once | INTRAVENOUS | Status: AC
Start: 2021-09-05 — End: 2021-09-05
  Administered 2021-09-05: 1000 mL via INTRAVENOUS

## 2021-09-05 NOTE — MAU Provider Note (Addendum)
MAU Provider Note  History  [id number]  Arrival date and time: 09/05/21 1819   Chief Complaint  Patient presents with   Contractions    HPI Krystal Hudson is a 26 y.o. at 60w2dwith PMHx notable for alpha thalassemia carrier, obesity, history of C-section and gestational hypertension and prior pregnancy, who presents for intractable headache, blurry vision, lower extremity swelling bilaterally and low back pain for 3 days.  Patient has tried Tylenol for headache and it has not helped.  She describes this headache is different than her normal because Tylenol typically helps.  She will feel her abdominal pain, back pain, headache and blurry vision all at once in waves, and then it will go away for about an hour and then returned for an hour.  Patient called her OB and described her symptoms and was told to come into an MAU for evaluation of preeclampsia.  Review of outside prenatal records from [employer](in media tab): YES  Vaginal bleeding: No LOF: No Fetal Movement: Yes Contractions: No  O/Positive/-- (02/07 1154)  OB History     Gravida  2   Para  1   Term  1   Preterm      AB      Living  1      SAB      IAB      Ectopic      Multiple  0   Live Births  1           Past Medical History:  Diagnosis Date   Anemia    Headache    Hypertension    Morbid obesity ([place]     Past Surgical History:  Procedure Laterality Date   CESAREAN SECTION N/A 11/20/2018   Procedure: CESAREAN SECTION;  Surgeon: DEmily Filbert MD;  Location: [facility] LD [facility]S;  Service: Obstetrics;  Laterality: N/A;   TYMPANOSTOMY TUBE PLACEMENT      Family History  Problem Relation Age of Onset   Healthy Mother    Hypertension Maternal Grandmother    Hypertension Maternal Grandfather    Asthma Neg Hx    Cancer Neg Hx    Diabetes Neg Hx    Heart disease Neg Hx    Stroke Neg Hx     Social History   Socioeconomic History   Marital status: Single    Spouse name: Not on file    Number of children: Not on file   Years of education: 12th   Highest education level: 12th grade  Occupational History   Occupation: PSecondary school teacher Tobacco Use   Smoking status: Never   Smokeless tobacco: Never  Vaping Use   Vaping Use: Former   Quit date: 04/17/2018  Substance and Sexual Activity   Alcohol use: Not Currently   Drug use: Never   Sexual activity: Yes    Birth control/protection: None  Other Topics Concern   Not on file  Social History Narrative   Not on file   Social Determinants of Health   Financial Resource Strain: Medium Risk (05/03/2018)   Overall Financial Resource Strain (CARDIA)    Difficulty of Paying Living Expenses: Somewhat hard  Food Insecurity: No Food Insecurity (05/03/2018)   Hunger Vital Sign    Worried About Running Out of Food in the Last Year: Never true    [place] Last [place]: Never true  Transportation Needs: Unmet Transportation Needs (05/03/2018)   PRAPARE - Transportation    Lack of Transportation (  Medical): Yes    Lack of Transportation (Non-Medical): Yes  Physical Activity: Inactive (05/03/2018)   Exercise Vital Sign    Days of Exercise per Week: 0 days    Minutes of Exercise per Session: 0 min  Stress: No Stress Concern Present (05/03/2018)   [place]    Feeling of Stress : Only a little  Social Connections: Unknown (05/03/2018)   Social Connection and Isolation Panel [NHANES]    Frequency of Communication with Friends and Family: Once a week    Frequency of Social Gatherings with Friends and Family: Once a week    Attends Religious Services: Patient refused    Marine scientist or Organizations: No    Attends Archivist Meetings: Never    Marital Status: Living with partner  Intimate Partner Violence: Not At Risk (05/03/2018)   Humiliation, Afraid, Rape, and Kick questionnaire    Fear of Current or Ex-Partner: No    Emotionally  Abused: No    Physically Abused: No    Sexually Abused: No    No Known Allergies  No current facility-administered medications on file prior to encounter.   Current Outpatient Medications on File Prior to Encounter  Medication Sig Dispense Refill   famotidine (PEPCID) 20 MG tablet Take 1 tablet (20 mg total) by mouth 2 (two) times daily. 60 tablet 3   Iron Polysacch Cmplx-B12-FA 150-0.025-1 MG CAPS Take 1 capsule by mouth every other day. 30 capsule 5   Prenat-Fe Poly-Methfol-FA-DHA (VITAFOL ULTRA) 29-0.6-0.4-200 MG CAPS Take 1 capsule by mouth daily before breakfast. 90 capsule 4   aspirin 81 MG chewable tablet Chew 1 tablet (81 mg total) by mouth daily. 30 tablet 5   Blood Pressure Monitoring (BLOOD PRESSURE KIT) DEVI 1 Device by Does not apply route once a week. 1 each 0   ondansetron (ZOFRAN-ODT) 4 MG disintegrating tablet Take 1 tablet (4 mg total) by mouth every 6 (six) hours as needed for nausea or vomiting. 30 tablet 1    Review of Systems  Constitutional:  Negative for chills and fever.  HENT:  Negative for congestion and ear pain.   Eyes:  Positive for blurred vision.  Respiratory:  Negative for cough.   Cardiovascular:  Positive for leg swelling. Negative for palpitations.  Gastrointestinal:  Positive for abdominal pain. Negative for vomiting.  Genitourinary:  Negative for dysuria.  Musculoskeletal:  Negative for back pain.       Low back pain   Skin:  Negative for rash.  Neurological:  Positive for headaches. Negative for sensory change and weakness.  Endo/Heme/Allergies:  Negative for polydipsia.  Psychiatric/Behavioral:  Negative for depression.    Pertinent positives and negative per HPI, all others reviewed and negative  Physical Exam   BP 117/77   Pulse (!) 106   Temp 98.6 F (37 C) (Oral)   Resp 18   Ht 5' 4"  (1.626 m)   Wt (!) 148.7 kg   LMP 12/18/2020 (Exact Date) Comment: LMP given by patient  SpO2 98%   BMI 56.27 kg/m   Patient Vitals for the  past 24 hrs:  BP Temp Temp src Pulse Resp SpO2 Height Weight  09/05/21 1946 117/77 -- -- (!) 106 -- -- -- --  09/05/21 1940 -- -- -- -- -- 98 % -- --  09/05/21 1935 -- -- -- -- -- 99 % -- --  09/05/21 1930 126/74 -- -- 97 -- 99 % -- --  09/05/21  1918 123/71 -- -- (!) 105 18 -- -- --  09/05/21 1850 134/69 98.6 F (37 C) Oral (!) 108 20 99 % 5' 4"  (1.626 m) (!) 148.7 kg    Physical Exam Vitals reviewed.  Constitutional:      Appearance: Normal appearance. She is obese.  HENT:     Head: Normocephalic and atraumatic.     Right Ear: External ear normal.     Left Ear: External ear normal.     Nose: Nose normal.     Mouth/Throat:     Pharynx: Oropharynx is clear.  Eyes:     Conjunctiva/sclera: Conjunctivae normal.  Cardiovascular:     Rate and Rhythm: Normal rate and regular rhythm.  Pulmonary:     Effort: Pulmonary effort is normal.  Abdominal:     Palpations: Abdomen is soft.     Tenderness: There is abdominal tenderness.     Comments: Gravid Right lower quadrant tenderness to palpation  Genitourinary:    General: Normal vulva.     Vagina: No vaginal discharge.     Rectum: Normal.     Comments: SVE closed thick and high Skin:    General: Skin is warm.     Capillary Refill: Capillary refill takes less than 2 seconds.  Neurological:     Mental Status: Mental status is at baseline.  Psychiatric:        Mood and Affect: Mood normal.      Cervical Exam Dilation: Closed  Bedside Ultrasound none  FHT Baseline 140, mod variability,  +accels,  no decels Toco: irritability Cat: 1  Labs No results found for this or any previous visit (from the past 24 hour(s)).  Imaging No results found.  MAU Course  Procedures Lab Orders         Urinalysis, Routine w reflex microscopic Urine, Clean Catch         Protein / creatinine ratio, urine         Comprehensive metabolic panel         CBC with Differential/Platelet     Meds ordered this encounter  Medications    lactated ringers bolus 1,000 mL   cyclobenzaprine (FLEXERIL) tablet 10 mg   Imaging Orders         MR MRV HEAD WO CM     MDM moderate  Assessment and Plan  Headache, intractable Blurry vision Abdominal pain/low back pain Preeclampsia rule out Fetal Well being FHT Cat 1 NST: Reactive, reassuring, 15 x 15 accelerations, no decelerations, contractions: Irritability  Patient given Flexeril and IV fluids for headaches.  Her presentation for pregnancy it is odd because no high blood pressure noted.  Given her blurry vision and headache, decided to get MRV brain to rule out dural sinus thrombosis.  SVE closed and no contractions, rules out labor.  Obtained preeclampsia labs per patient request and given her history of gestational hypertension.  Awaiting results, disposition pending.   Shelda Pal, DO 09/05/21 8:01 PM   Assumed care of patient after 8 pm and report given by Dr Clement Husbands and Dr Dione Plover.  MDM:  After Flexeril dose, pt h/a significantly improved.  Pt without visual symptoms. Neuro exam wnl.  PEC labs wnl and BP grossly normal.    Discussed options with pt, including waiting for MRI tonight, which is delayed due to high volume, or discharge home with Rx for Flexeril and return if headache or other symptoms return and are not resolved with treatments. Discussed treatment of h/a including increased  PO fluids, Tylenol, caffeine, and/or Flexeril PRN.  Pt states understanding and prefers discharge tonight and will return with worsening symptoms.  With no evidence of labor tonight and reactive NST, no obstetric concerns.  Pt to [facility] app[facility] and return to MAU for labor or emergencies.  A/P: 1. Intractable headache, unspecified chronicity pattern, unspecified headache type   2. [redacted] weeks gestation of pregnancy   3. Generalized abdominal pain   4. Low back pain during pregnancy in third trimester   5. Blurry vision, bilateral      D/C home  with return precautions.  [name], CNM 10:02 PM

## 2021-09-05 NOTE — Discharge Instructions (Signed)
Reasons to return to MAU at [facility]:  1.  Contractions are  5 minutes apart or less, each last 1 minute, these have been going on for 1-2 hours, and you cannot walk or talk during them 2.  You have a large gush of fluid, or a trickle of fluid that will not stop and you have to wear a pad 3.  You have bleeding that is bright red, heavier than spotting--like menstrual bleeding (spotting can be normal in early labor or after a check of your cervix) 4.  You do not feel the baby moving like he/she normally does  

## 2021-09-05 NOTE — Telephone Encounter (Signed)
Attempted call to pt in response to Mychart message. LVM  Return call from patient. Pt recommended to present to MAU for evaluation of leg swelling, headaches and contractions.  Pt agreed, states that she will go to hospital today, before 5pm.

## 2021-09-05 NOTE — MAU Note (Addendum)
Audible fetal movement. Pt reports baby very active. Prolonged accels noted

## 2021-09-05 NOTE — MAU Note (Signed)
Krystal Hudson is a 26 y.o. at [redacted]w[redacted]d here in MAU reporting: for the last 3 days has been having swollen feet, nothing she does helps.  Has also been having HA and blurred vision for those 3 days.  No relief with Tylenol. Having some irreg low back pain, dr told her it was contractions.   No bleeding or leaking, reports +FM  Onset of complaint: 3 days Pain score: HA 5, back 7 Vitals:   09/05/21 1850  BP: 134/69  Pulse: (!) 108  Resp: 20  Temp: 98.6 F (37 C)  SpO2: 99%     FHT:165 Lab orders placed from triage:  urine

## 2021-09-10 ENCOUNTER — Ambulatory Visit: Payer: 59 | Attending: Maternal & Fetal Medicine

## 2021-09-10 ENCOUNTER — Ambulatory Visit: Payer: 59 | Admitting: *Deleted

## 2021-09-10 DIAGNOSIS — O09299 Supervision of pregnancy with other poor reproductive or obstetric history, unspecified trimester: Secondary | ICD-10-CM | POA: Diagnosis present

## 2021-09-10 DIAGNOSIS — Z148 Genetic carrier of other disease: Secondary | ICD-10-CM

## 2021-09-10 DIAGNOSIS — O34219 Maternal care for unspecified type scar from previous cesarean delivery: Secondary | ICD-10-CM

## 2021-09-10 DIAGNOSIS — Z3A38 38 weeks gestation of pregnancy: Secondary | ICD-10-CM | POA: Diagnosis not present

## 2021-09-10 DIAGNOSIS — K760 Fatty (change of) liver, not elsewhere classified: Secondary | ICD-10-CM | POA: Diagnosis not present

## 2021-09-10 DIAGNOSIS — O99613 Diseases of the digestive system complicating pregnancy, third trimester: Secondary | ICD-10-CM | POA: Diagnosis not present

## 2021-09-10 DIAGNOSIS — O99213 Obesity complicating pregnancy, third trimester: Secondary | ICD-10-CM | POA: Diagnosis not present

## 2021-09-12 ENCOUNTER — Ambulatory Visit (INDEPENDENT_AMBULATORY_CARE_PROVIDER_SITE_OTHER): Payer: 59 | Admitting: Student

## 2021-09-12 VITALS — BP 133/85 | HR 88 | Wt 331.2 lb

## 2021-09-12 DIAGNOSIS — O99820 Streptococcus B carrier state complicating pregnancy: Secondary | ICD-10-CM

## 2021-09-12 DIAGNOSIS — O34219 Maternal care for unspecified type scar from previous cesarean delivery: Secondary | ICD-10-CM

## 2021-09-12 DIAGNOSIS — O99213 Obesity complicating pregnancy, third trimester: Secondary | ICD-10-CM

## 2021-09-12 DIAGNOSIS — Z3483 Encounter for supervision of other normal pregnancy, third trimester: Secondary | ICD-10-CM

## 2021-09-12 DIAGNOSIS — Z3A38 38 weeks gestation of pregnancy: Secondary | ICD-10-CM

## 2021-09-12 NOTE — Progress Notes (Signed)
   PRENATAL VISIT NOTE  Subjective:  Krystal Hudson is a 26 y.o. G2P1001 at [redacted]w[redacted]d being seen today for ongoing prenatal care.  She is currently monitored for the following issues for this high-risk pregnancy and has Maternal morbid obesity, antepartum (HCC); Supervision of normal pregnancy; History of cesarean section complicating pregnancy; Hepatic steatosis during pregnancy; and Alpha thalassemia silent carrier on their problem list.  Patient reports backache.  Contractions: Irritability. Vag. Bleeding: None.  Movement: Present. Denies leaking of fluid.   The following portions of the patient's history were reviewed and updated as appropriate: allergies, current medications, past family history, past medical history, past social history, past surgical history and problem list.   Objective:   Vitals:   09/12/21 1348  BP: 133/85  Pulse: 88  Weight: (!) 331 lb 3.2 oz (150.2 kg)    Fetal Status: Fetal Heart Rate (bpm): 155   Movement: Present     General:  Alert, oriented and cooperative. Patient is in no acute distress.  Skin: Skin is warm and dry. No rash noted.   Cardiovascular: Normal heart rate noted  Respiratory: Normal respiratory effort, no problems with respiration noted  Abdomen: Soft, gravid, appropriate for gestational age.  Pain/Pressure: Absent     Pelvic: Cervical exam deferred        Extremities: Normal range of motion.     Mental Status: Normal mood and affect. Normal behavior. Normal judgment and thought content.   Assessment and Plan:  Pregnancy: G2P1001 at [redacted]w[redacted]d 1. Encounter for supervision of other normal pregnancy in third trimester - Doing well, only complaint is back pain, does not desire any additional intervention for this - Frequent fetal movement present - Would like to discuss desire for BTL with MD  2. [redacted] weeks gestation of pregnancy - plan for c/section next week  3. Maternal morbid obesity, antepartum (HCC) 4. History of cesarean section  complicating pregnancy - C/Section scheduled on 08/01, next week  5. GBS (group B Streptococcus carrier), +RV culture, currently pregnant   Term labor symptoms and general obstetric precautions including but not limited to vaginal bleeding, contractions, leaking of fluid and fetal movement were reviewed in detail with the patient. Please refer to After Visit Summary for other counseling recommendations.   No follow-ups on file.  Future Appointments  Date Time Provider Department Center  09/16/2021  9:00 AM [facility] PAT 1 [facility] None    Corlis Hove, NP

## 2021-09-16 ENCOUNTER — Encounter (HOSPITAL_COMMUNITY): Payer: Self-pay | Admitting: Family Medicine

## 2021-09-16 ENCOUNTER — Encounter (HOSPITAL_COMMUNITY)
Admission: RE | Admit: 2021-09-16 | Discharge: 2021-09-16 | Disposition: A | Discharge status: Home / Self Care | Payer: Medicaid Other | Source: Ambulatory Visit | Attending: Family Medicine | Admitting: Family Medicine

## 2021-09-16 DIAGNOSIS — Z01812 Encounter for preprocedural laboratory examination: Secondary | ICD-10-CM | POA: Insufficient documentation

## 2021-09-16 DIAGNOSIS — O34219 Maternal care for unspecified type scar from previous cesarean delivery: Secondary | ICD-10-CM | POA: Insufficient documentation

## 2021-09-16 LAB — RPR: RPR Ser Ql: NONREACTIVE

## 2021-09-16 LAB — CBC
HCT: 30.8 % — ABNORMAL LOW (ref 36.0–46.0)
Hemoglobin: 9.4 g/dL — ABNORMAL LOW (ref 12.0–15.0)
MCH: 23.6 pg — ABNORMAL LOW (ref 26.0–34.0)
MCHC: 30.5 g/dL (ref 30.0–36.0)
MCV: 77.2 fL — ABNORMAL LOW (ref 80.0–100.0)
Platelets: 260 10*3/uL (ref 150–400)
RBC: 3.99 MIL/uL (ref 3.87–5.11)
RDW: 16.6 % — ABNORMAL HIGH (ref 11.5–15.5)
WBC: 6.3 10*3/uL (ref 4.0–10.5)
nRBC: 0 % (ref 0.0–0.2)

## 2021-09-16 LAB — TYPE AND SCREEN
ABO/RH(D): O POS
Antibody Screen: NEGATIVE

## 2021-09-16 NOTE — H&P (Signed)
OBSTETRIC ADMISSION HISTORY AND PHYSICAL  Krystal Hudson is a 26 y.o. female G2P1001 with IUP at 62w6dby LMP presenting for rLTCS. She reports +FMs, No LOF, no VB, no blurry vision, headaches or peripheral edema, and RUQ pain.  She plans on breast feeding. She request interval  BTL for birth control, declined BTL today and is otherwise undecided at this time.  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 -maternal obesity affecting pregnancy -GBS+  Past Medical History: Past Medical History:  Diagnosis Date   Anemia    Headache    Hypertension    Morbid obesity ([place]     Past Surgical History: Past Surgical History:  Procedure Laterality Date   CESAREAN SECTION N/A 11/20/2018   Procedure: CESAREAN SECTION;  Surgeon: DEmily Filbert MD;  Location: [facility]S;  Service: Obstetrics;  Laterality: N/A;   TYMPANOSTOMY TUBE PLACEMENT      Obstetrical History: OB History     Gravida  2   Para  1   Term  1   Preterm      AB      Living  1      SAB      IAB      Ectopic      Multiple  0   Live Births  1           Social History Social History   Socioeconomic History   Marital status: Single    Spouse name: Not on file   Number of children: Not on file   Years of education: 12th   Highest education level: 12th grade  Occupational History   Occupation: PSecondary school teacher Tobacco Use   Smoking status: Never   Smokeless tobacco: Never  Vaping Use   Vaping Use: Former   Quit date: 04/17/2018  Substance and Sexual Activity   Alcohol use: Not Currently   Drug use: Never   Sexual activity: Yes    Birth control/protection: None  Other Topics Concern   Not on file  Social History Narrative   Not on file    Social Determinants of Health   Financial Resource Strain: [place](05/03/2018)   Overall Financial Resource Strain (CARDIA)    Difficulty of Paying Living Expenses: Somewhat hard  Food Insecurity: No Food Insecurity (05/03/2018)   Hunger Vital Sign    Worried About R[place] the Last Year: Never true    RB[place] the Last Year: Never true  Transportation Needs: Unmet Transportation Needs (05/03/2018)   PRAPARE - Transportation    Lack of Transportation (Medical): Yes    Lack of Transportation (Non-Medical): Yes  Physical Activity: Inactive (05/03/2018)   Exercise Vital Sign    Days of Exercise per Week: 0 days    Minutes of Exercise per Session: 0 min  Stress: No Stress Concern Present (05/03/2018)   [place]   Feeling of Stress : Only a little  Social Connections: Unknown (05/03/2018)   Social Connection and Isolation Panel [NHANES]    Frequency of Communication with Friends and Family: Once a week    Frequency of Social Gatherings with Friends and Family: Once a week    Attends  Religious Services: Patient refused    Active Member of Clubs or Organizations: No    Attends Archivist Meetings: Never    Marital Status: Living with partner    Family History: Family History  Problem Relation Age of Onset   Healthy Mother    Hypertension Maternal Grandmother    Hypertension Maternal Grandfather    Asthma Neg Hx    Cancer Neg Hx    Diabetes Neg Hx    Heart disease Neg Hx    Stroke Neg Hx     Allergies: No Known Allergies  Medications Prior to Admission  Medication Sig Dispense Refill Last Dose   cyclobenzaprine (FLEXERIL) 10 MG tablet Take 1 tablet (10 mg total) by mouth 3 (three) times daily as needed for muscle spasms. 20 tablet 0 09/15/2021   famotidine (PEPCID) 20 MG tablet Take 1 tablet (20 mg total) by mouth 2 (two) times daily. 60 tablet 3 09/15/2021   Iron  Polysacch Cmplx-B12-FA 150-0.025-1 MG CAPS Take 1 capsule by mouth every other day. 30 capsule 5 09/15/2021   Prenat-Fe Poly-Methfol-FA-DHA (VITAFOL ULTRA) 29-0.6-0.4-200 MG CAPS Take 1 capsule by mouth daily before breakfast. 90 capsule 4 09/17/2021 at 0630   aspirin 81 MG chewable tablet Chew 1 tablet (81 mg total) by mouth daily. (Patient not taking: Reported on 09/11/2021) 30 tablet 5 09/03/2021   Blood Pressure Monitoring (BLOOD PRESSURE KIT) DEVI 1 Device by Does not apply route once a week. (Patient not taking: Reported on 09/12/2021) 1 each 0    ondansetron (ZOFRAN-ODT) 4 MG disintegrating tablet Take 1 tablet (4 mg total) by mouth every 6 (six) hours as needed for nausea or vomiting. (Patient not taking: Reported on 09/11/2021) 30 tablet 1 09/10/2021     Review of Systems   All systems reviewed and negative except as stated in HPI  Blood pressure 119/83, pulse 70, temperature 97.8 F (36.6 C), temperature source Oral, resp. rate 18, height 5' 4"  (1.626 m), weight 111.1 kg, last menstrual period 12/18/2020, SpO2 99 %. General appearance: alert, cooperative, and no distress Lungs: clear to auscultation bilaterally Heart: regular rate and rhythm Abdomen: soft, non-tender; bowel sounds normal. Evidence of prior low transverse c-section  Pelvic: deferred Extremities: Homans sign is negative, no sign of DVT Presentation: cephalic on document ultrasound    Prenatal labs: ABO, Rh: --/--/O POS (07/31 2831) Antibody: NEG (07/31 0906) Rubella: 2.44 (02/07 1154) RPR: NON REACTIVE (07/31 0906)  HBsAg: Negative (02/07 1154)  HIV: Non Reactive (05/02 0907)  GBS: Positive/-- (07/12 1659)  1 hr Glucola wnl Genetic screening  alpha thal silen carrier Anatomy US- IECF otherwise wnl, female  Prenatal Transfer Tool  Maternal Diabetes: No Genetic Screening: Normal Maternal Ultrasounds/Referrals: Isolated EIF (echogenic intracardiac focus) Fetal Ultrasounds or other Referrals:  None Maternal  Substance Abuse:  No Significant Maternal Medications:  None Significant Maternal Lab Results: Group B Strep positive  No results found for this or any previous visit (from the past 24 hour(s)).   Patient Active Problem List   Diagnosis Date Noted   Alpha thalassemia silent carrier 07/02/2021   Hepatic steatosis during pregnancy 05/26/2021   History of cesarean section complicating pregnancy 51/76/1607   Supervision of normal pregnancy 03/26/2021   Maternal morbid obesity, antepartum ([place]) 11/18/2018    Assessment/Plan:  Krystal Hudson is a 26 y.o. G2P1001 at 33w6dhere for rLTCS.   #Labor: scheduled C/S #Pain: Spinal #FWB: +FM #ID:  GBS+ #MOF: Breast #MOC: Possible Interval BTL; Undecided. Plans to discuss with  OB further postpartum. Offered BTL today. Declined at this time. Hx of prior IUD.  #Circ:  N/a, girl   Krystal Lengel Autry-Lott, DO  09/17/2021, 9:10 AM

## 2021-09-17 ENCOUNTER — Encounter (HOSPITAL_COMMUNITY): Payer: Self-pay | Admitting: Family Medicine

## 2021-09-17 ENCOUNTER — Inpatient Hospital Stay (HOSPITAL_COMMUNITY)
Admission: AD | Admit: 2021-09-17 | Discharge: 2021-09-19 | DRG: 787 | Disposition: A | Discharge status: Home / Self Care | Payer: Medicaid Other | Attending: Family Medicine | Admitting: Family Medicine

## 2021-09-17 ENCOUNTER — Inpatient Hospital Stay (HOSPITAL_COMMUNITY): Payer: Medicaid Other | Admitting: Anesthesiology

## 2021-09-17 ENCOUNTER — Encounter (HOSPITAL_COMMUNITY)
Admission: AD | Disposition: A | Discharge status: Home / Self Care | Payer: Self-pay | Source: Home / Self Care | Attending: Family Medicine

## 2021-09-17 ENCOUNTER — Other Ambulatory Visit: Payer: Self-pay

## 2021-09-17 DIAGNOSIS — O164 Unspecified maternal hypertension, complicating childbirth: Secondary | ICD-10-CM

## 2021-09-17 DIAGNOSIS — Z3A39 39 weeks gestation of pregnancy: Secondary | ICD-10-CM | POA: Diagnosis not present

## 2021-09-17 DIAGNOSIS — Z3A38 38 weeks gestation of pregnancy: Secondary | ICD-10-CM | POA: Diagnosis not present

## 2021-09-17 DIAGNOSIS — O34211 Maternal care for low transverse scar from previous cesarean delivery: Secondary | ICD-10-CM

## 2021-09-17 DIAGNOSIS — O99214 Obesity complicating childbirth: Secondary | ICD-10-CM | POA: Diagnosis present

## 2021-09-17 DIAGNOSIS — O99824 Streptococcus B carrier state complicating childbirth: Secondary | ICD-10-CM | POA: Diagnosis present

## 2021-09-17 DIAGNOSIS — D649 Anemia, unspecified: Secondary | ICD-10-CM

## 2021-09-17 DIAGNOSIS — O9902 Anemia complicating childbirth: Secondary | ICD-10-CM | POA: Diagnosis not present

## 2021-09-17 DIAGNOSIS — D563 Thalassemia minor: Secondary | ICD-10-CM | POA: Diagnosis present

## 2021-09-17 DIAGNOSIS — O2662 Liver and biliary tract disorders in childbirth: Secondary | ICD-10-CM | POA: Diagnosis present

## 2021-09-17 DIAGNOSIS — K760 Fatty (change of) liver, not elsewhere classified: Secondary | ICD-10-CM | POA: Diagnosis present

## 2021-09-17 DIAGNOSIS — Z98891 History of uterine scar from previous surgery: Secondary | ICD-10-CM

## 2021-09-17 LAB — CREATININE, SERUM
Creatinine, Ser: 0.59 mg/dL (ref 0.44–1.00)
GFR, Estimated: >60 mL/min (ref >60)

## 2021-09-17 SURGERY — Surgical Case
Anesthesia: Spinal

## 2021-09-17 MED ORDER — HYDROMORPHONE HCL 1 MG/ML IJ SOLN
INTRAMUSCULAR | Status: AC
  Filled 2021-09-17: qty 0.5

## 2021-09-17 MED ORDER — COCONUT OIL OIL: 1.0000 | TOPICAL_OIL | Status: DC | PRN

## 2021-09-17 MED ORDER — BUPIVACAINE IN DEXTROSE 0.75-8.25 % IT SOLN
INTRATHECAL | Status: DC | PRN
  Administered 2021-09-17: 1.6 mL via INTRATHECAL

## 2021-09-17 MED ORDER — DEXAMETHASONE SODIUM PHOSPHATE 4 MG/ML IJ SOLN
INTRAMUSCULAR | Status: AC
  Filled 2021-09-17: qty 1

## 2021-09-17 MED ORDER — CEFAZOLIN SODIUM-DEXTROSE 2-4 GM/100ML-% IV SOLN
INTRAVENOUS | Status: AC
  Filled 2021-09-17: qty 100

## 2021-09-17 MED ORDER — TRANEXAMIC ACID-NACL 1000-0.7 MG/100ML-% IV SOLN
INTRAVENOUS | Status: DC | PRN
  Administered 2021-09-17: 1000 mg via INTRAVENOUS

## 2021-09-17 MED ORDER — OXYCODONE HCL 5 MG PO TABS
5.0000 mg | ORAL_TABLET | ORAL | Status: DC | PRN
  Administered 2021-09-18: 5 mg via ORAL
  Administered 2021-09-19: 10 mg via ORAL
  Filled 2021-09-17: qty 1
  Filled 2021-09-17: qty 2

## 2021-09-17 MED ORDER — SIMETHICONE 80 MG PO CHEW: 80.0000 mg | CHEWABLE_TABLET | ORAL | Status: DC | PRN

## 2021-09-17 MED ORDER — SCOPOLAMINE 1 MG/3DAYS TD PT72: 1.0000 | MEDICATED_PATCH | Freq: Once | TRANSDERMAL | Status: DC

## 2021-09-17 MED ORDER — HYDROMORPHONE HCL 1 MG/ML IJ SOLN
0.2500 mg | INTRAMUSCULAR | Status: DC | PRN
  Administered 2021-09-17 (×2): 0.5 mg via INTRAVENOUS

## 2021-09-17 MED ORDER — SOD CITRATE-CITRIC ACID 500-334 MG/5ML PO SOLN
30.0000 mL | Freq: Once | ORAL | Status: AC
Start: 2021-09-17 — End: 2021-09-17
  Administered 2021-09-17: 30 mL via ORAL

## 2021-09-17 MED ORDER — ONDANSETRON HCL 4 MG/2ML IJ SOLN: 4.0000 mg | Freq: Three times a day (TID) | INTRAMUSCULAR | Status: DC | PRN

## 2021-09-17 MED ORDER — PHENYLEPHRINE 80 MCG/ML (10ML) SYRINGE FOR IV PUSH (FOR BLOOD PRESSURE SUPPORT)
PREFILLED_SYRINGE | INTRAVENOUS | Status: AC
  Filled 2021-09-17: qty 10

## 2021-09-17 MED ORDER — ONDANSETRON HCL 4 MG/2ML IJ SOLN
INTRAMUSCULAR | Status: DC | PRN
  Administered 2021-09-17: 4 mg via INTRAVENOUS

## 2021-09-17 MED ORDER — OXYTOCIN-SODIUM CHLORIDE 30-0.9 UT/500ML-% IV SOLN
2.5000 [IU]/h | INTRAVENOUS | Status: AC
  Administered 2021-09-17: 2.5 [IU]/h via INTRAVENOUS
  Filled 2021-09-17: qty 500

## 2021-09-17 MED ORDER — ENOXAPARIN SODIUM 60 MG/0.6ML IJ SOSY
50.0000 mg | PREFILLED_SYRINGE | INTRAMUSCULAR | Status: DC
  Administered 2021-09-18 – 2021-09-19 (×2): 50 mg via SUBCUTANEOUS
  Filled 2021-09-17 (×2): qty 0.6

## 2021-09-17 MED ORDER — DEXAMETHASONE SODIUM PHOSPHATE 10 MG/ML IJ SOLN
INTRAMUSCULAR | Status: AC
  Filled 2021-09-17: qty 1

## 2021-09-17 MED ORDER — SENNOSIDES-DOCUSATE SODIUM 8.6-50 MG PO TABS
2.0000 | ORAL_TABLET | Freq: Every day | ORAL | Status: DC
  Administered 2021-09-18 – 2021-09-19 (×2): 2 via ORAL
  Filled 2021-09-17 (×2): qty 2

## 2021-09-17 MED ORDER — ACETAMINOPHEN 10 MG/ML IV SOLN
INTRAVENOUS | Status: AC
  Filled 2021-09-17: qty 100

## 2021-09-17 MED ORDER — LIDOCAINE-EPINEPHRINE (PF) 2 %-1:200000 IJ SOLN
INTRAMUSCULAR | Status: AC
  Filled 2021-09-17: qty 20

## 2021-09-17 MED ORDER — KETOROLAC TROMETHAMINE 30 MG/ML IJ SOLN
INTRAMUSCULAR | Status: DC | PRN
  Administered 2021-09-17: 30 mg via INTRAVENOUS

## 2021-09-17 MED ORDER — MORPHINE SULFATE (PF) 0.5 MG/ML IJ SOLN
INTRAMUSCULAR | Status: DC | PRN
  Administered 2021-09-17: 150 ug via INTRATHECAL

## 2021-09-17 MED ORDER — OXYTOCIN-SODIUM CHLORIDE 30-0.9 UT/500ML-% IV SOLN
INTRAVENOUS | Status: AC
  Filled 2021-09-17: qty 500

## 2021-09-17 MED ORDER — TETANUS-DIPHTH-ACELL PERTUSSIS 5-2.5-18.5 LF-MCG/0.5 IM SUSY: 0.5000 mL | PREFILLED_SYRINGE | Freq: Once | INTRAMUSCULAR | Status: DC

## 2021-09-17 MED ORDER — ACETAMINOPHEN 10 MG/ML IV SOLN
INTRAVENOUS | Status: DC | PRN
  Administered 2021-09-17: 1000 mg via INTRAVENOUS

## 2021-09-17 MED ORDER — POVIDONE-IODINE 10 % EX SWAB
2.0000 | Freq: Once | CUTANEOUS | Status: AC
  Administered 2021-09-17: 2 via TOPICAL

## 2021-09-17 MED ORDER — IBUPROFEN 600 MG PO TABS
600.0000 mg | ORAL_TABLET | Freq: Four times a day (QID) | ORAL | Status: DC | PRN
Start: 2021-09-18 — End: 2021-09-19
  Administered 2021-09-18 – 2021-09-19 (×4): 600 mg via ORAL
  Filled 2021-09-17 (×4): qty 1

## 2021-09-17 MED ORDER — DIBUCAINE (PERIANAL) 1 % EX OINT: 1.0000 | TOPICAL_OINTMENT | CUTANEOUS | Status: DC | PRN

## 2021-09-17 MED ORDER — MORPHINE SULFATE (PF) 0.5 MG/ML IJ SOLN
INTRAMUSCULAR | Status: AC
  Filled 2021-09-17: qty 10

## 2021-09-17 MED ORDER — SODIUM CHLORIDE 0.9% FLUSH
3.0000 mL | INTRAVENOUS | Status: DC | PRN
Start: 2021-09-17 — End: 2021-09-19

## 2021-09-17 MED ORDER — PRENATAL MULTIVITAMIN CH
1.0000 | ORAL_TABLET | Freq: Every day | ORAL | Status: DC
  Administered 2021-09-17 – 2021-09-18 (×2): 1 via ORAL
  Filled 2021-09-17 (×2): qty 1

## 2021-09-17 MED ORDER — ONDANSETRON HCL 4 MG/2ML IJ SOLN
INTRAMUSCULAR | Status: AC
  Filled 2021-09-17: qty 2

## 2021-09-17 MED ORDER — SCOPOLAMINE 1 MG/3DAYS TD PT72
MEDICATED_PATCH | TRANSDERMAL | Status: AC
  Filled 2021-09-17: qty 1

## 2021-09-17 MED ORDER — KETOROLAC TROMETHAMINE 30 MG/ML IJ SOLN
30.0000 mg | Freq: Four times a day (QID) | INTRAMUSCULAR | Status: AC
  Administered 2021-09-17 – 2021-09-18 (×3): 30 mg via INTRAVENOUS
  Filled 2021-09-17 (×4): qty 1

## 2021-09-17 MED ORDER — NALOXONE HCL 4 MG/10ML IJ SOLN: 1.0000 ug/kg/h | INTRAVENOUS | Status: DC | PRN

## 2021-09-17 MED ORDER — PROMETHAZINE HCL 25 MG/ML IJ SOLN: 6.2500 mg | INTRAMUSCULAR | Status: DC | PRN

## 2021-09-17 MED ORDER — KETOROLAC TROMETHAMINE 30 MG/ML IJ SOLN
INTRAMUSCULAR | Status: AC
  Filled 2021-09-17: qty 1

## 2021-09-17 MED ORDER — OXYTOCIN-SODIUM CHLORIDE 30-0.9 UT/500ML-% IV SOLN: INTRAVENOUS | Status: DC | PRN

## 2021-09-17 MED ORDER — FENTANYL CITRATE (PF) 100 MCG/2ML IJ SOLN
INTRAMUSCULAR | Status: DC | PRN
Start: 2021-09-17 — End: 2021-09-17
  Administered 2021-09-17: 15 ug via INTRATHECAL

## 2021-09-17 MED ORDER — DIPHENHYDRAMINE HCL 50 MG/ML IJ SOLN: 12.5000 mg | INTRAMUSCULAR | Status: DC | PRN

## 2021-09-17 MED ORDER — CEFAZOLIN SODIUM-DEXTROSE 2-4 GM/100ML-% IV SOLN
2.0000 g | INTRAVENOUS | Status: AC
  Administered 2021-09-17: 2 g via INTRAVENOUS

## 2021-09-17 MED ORDER — SIMETHICONE 80 MG PO CHEW
80.0000 mg | CHEWABLE_TABLET | Freq: Three times a day (TID) | ORAL | Status: DC
  Administered 2021-09-17 – 2021-09-19 (×4): 80 mg via ORAL
  Filled 2021-09-17 (×5): qty 1

## 2021-09-17 MED ORDER — SODIUM CHLORIDE 0.9 % IR SOLN
Status: DC | PRN
  Administered 2021-09-17: 1

## 2021-09-17 MED ORDER — METOCLOPRAMIDE HCL 5 MG/ML IJ SOLN: INTRAMUSCULAR | Status: DC | PRN

## 2021-09-17 MED ORDER — FENTANYL CITRATE (PF) 100 MCG/2ML IJ SOLN
INTRAMUSCULAR | Status: AC
  Filled 2021-09-17: qty 2

## 2021-09-17 MED ORDER — METOCLOPRAMIDE HCL 5 MG/ML IJ SOLN
INTRAMUSCULAR | Status: DC | PRN
  Administered 2021-09-17: 10 mg via INTRAVENOUS

## 2021-09-17 MED ORDER — DEXAMETHASONE SODIUM PHOSPHATE 4 MG/ML IJ SOLN
INTRAMUSCULAR | Status: DC | PRN
  Administered 2021-09-17: 10 mg via INTRAVENOUS

## 2021-09-17 MED ORDER — MEPERIDINE HCL 25 MG/ML IJ SOLN: 6.2500 mg | INTRAMUSCULAR | Status: DC | PRN

## 2021-09-17 MED ORDER — OXYTOCIN-SODIUM CHLORIDE 30-0.9 UT/500ML-% IV SOLN
INTRAVENOUS | Status: DC | PRN
  Administered 2021-09-17: 400 mL via INTRAVENOUS

## 2021-09-17 MED ORDER — PHENYLEPHRINE HCL-NACL 20-0.9 MG/250ML-% IV SOLN
INTRAVENOUS | Status: DC | PRN
  Administered 2021-09-17: 60 ug/min via INTRAVENOUS

## 2021-09-17 MED ORDER — TRANEXAMIC ACID-NACL 1000-0.7 MG/100ML-% IV SOLN
INTRAVENOUS | Status: AC
  Filled 2021-09-17: qty 100

## 2021-09-17 MED ORDER — DIPHENHYDRAMINE HCL 25 MG PO CAPS: 25.0000 mg | ORAL_CAPSULE | Freq: Four times a day (QID) | ORAL | Status: DC | PRN

## 2021-09-17 MED ORDER — LACTATED RINGERS IV SOLN: INTRAVENOUS | Status: DC

## 2021-09-17 MED ORDER — DIPHENHYDRAMINE HCL 25 MG PO CAPS: 25.0000 mg | ORAL_CAPSULE | ORAL | Status: DC | PRN

## 2021-09-17 MED ORDER — LIDOCAINE HCL (PF) 1 % IJ SOLN
INTRAMUSCULAR | Status: AC
  Filled 2021-09-17: qty 5

## 2021-09-17 MED ORDER — PHENYLEPHRINE HCL (PRESSORS) 10 MG/ML IV SOLN
INTRAVENOUS | Status: DC | PRN
  Administered 2021-09-17: 160 ug via INTRAVENOUS
  Administered 2021-09-17 (×4): 80 ug via INTRAVENOUS

## 2021-09-17 MED ORDER — SCOPOLAMINE 1 MG/3DAYS TD PT72
MEDICATED_PATCH | TRANSDERMAL | Status: DC | PRN
  Administered 2021-09-17: 1 via TRANSDERMAL

## 2021-09-17 MED ORDER — WITCH HAZEL-GLYCERIN EX PADS: 1.0000 | MEDICATED_PAD | CUTANEOUS | Status: DC | PRN

## 2021-09-17 MED ORDER — MENTHOL 3 MG MT LOZG
1.0000 | LOZENGE | OROMUCOSAL | Status: DC | PRN
Start: 2021-09-17 — End: 2021-09-19

## 2021-09-17 MED ORDER — NALOXONE HCL 0.4 MG/ML IJ SOLN: 0.4000 mg | INTRAMUSCULAR | Status: DC | PRN

## 2021-09-17 MED ORDER — SOD CITRATE-CITRIC ACID 500-334 MG/5ML PO SOLN
ORAL | Status: AC
  Filled 2021-09-17: qty 30

## 2021-09-17 SURGICAL SUPPLY — 42 items
BENZOIN TINCTURE PRP APPL 2/3 (GAUZE/BANDAGES/DRESSINGS) ×2 IMPLANT
CHLORAPREP W/TINT 26ML (MISCELLANEOUS) ×4 IMPLANT
CLAMP CORD UMBIL (MISCELLANEOUS) ×2 IMPLANT
CLOTH BEACON ORANGE TIMEOUT ST (SAFETY) ×2 IMPLANT
DERMABOND ADVANCED (GAUZE/BANDAGES/DRESSINGS) ×4
DERMABOND ADVANCED .7 DNX12 (GAUZE/BANDAGES/DRESSINGS) IMPLANT
DRSG OPSITE POSTOP 4X10 (GAUZE/BANDAGES/DRESSINGS) ×2 IMPLANT
ELECT REM PT RETURN 9FT ADLT (ELECTROSURGICAL) ×2
ELECTRODE REM PT RTRN 9FT ADLT (ELECTROSURGICAL) ×1 IMPLANT
EXTENDER TRAXI PANNICULUS (MISCELLANEOUS) IMPLANT
EXTRACTOR VACUUM KIWI (MISCELLANEOUS) ×1 IMPLANT
EXTRACTOR VACUUM M CUP 4 TUBE (SUCTIONS) IMPLANT
GLOVE BIOGEL PI IND STRL 7.0 (GLOVE) ×2 IMPLANT
GLOVE BIOGEL PI IND STRL 7.5 (GLOVE) ×2 IMPLANT
GLOVE BIOGEL PI INDICATOR 7.0 (GLOVE) ×4
GLOVE BIOGEL PI INDICATOR 7.5 (GLOVE) ×4
GLOVE ECLIPSE 7.5 STRL STRAW (GLOVE) ×2 IMPLANT
GOWN STRL REUS W/TWL LRG LVL3 (GOWN DISPOSABLE) ×6 IMPLANT
KIT ABG SYR 3ML LUER SLIP (SYRINGE) IMPLANT
MAT PREVALON FULL STRYKER (MISCELLANEOUS) ×1 IMPLANT
NDL HYPO 25X5/8 SAFETYGLIDE (NEEDLE) IMPLANT
NEEDLE HYPO 25X5/8 SAFETYGLIDE (NEEDLE) IMPLANT
NS IRRIG 1000ML POUR BTL (IV SOLUTION) ×2 IMPLANT
PACK C SECTION WH (CUSTOM PROCEDURE TRAY) ×2 IMPLANT
PAD OB MATERNITY 4.3X12.25 (PERSONAL CARE ITEMS) ×2 IMPLANT
RETAINER VISCERAL (MISCELLANEOUS) ×1 IMPLANT
RETRACTOR TRAXI PANNICULUS (MISCELLANEOUS) IMPLANT
RTRCTR C-SECT PINK 25CM LRG (MISCELLANEOUS) ×2 IMPLANT
STRIP CLOSURE SKIN 1/2X4 (GAUZE/BANDAGES/DRESSINGS) ×2 IMPLANT
SUT MNCRL 0 VIOLET CTX 36 (SUTURE) ×2 IMPLANT
SUT MONOCRYL 0 CTX 36 (SUTURE) ×4
SUT PLAIN 2 0 XLH (SUTURE) ×1 IMPLANT
SUT VIC AB 0 CTX 36 (SUTURE) ×6
SUT VIC AB 0 CTX36XBRD ANBCTRL (SUTURE) ×1 IMPLANT
SUT VIC AB 2-0 CT1 27 (SUTURE) ×2
SUT VIC AB 2-0 CT1 TAPERPNT 27 (SUTURE) ×1 IMPLANT
SUT VIC AB 4-0 KS 27 (SUTURE) ×2 IMPLANT
TOWEL OR 17X24 6PK STRL BLUE (TOWEL DISPOSABLE) ×2 IMPLANT
TRAXI PANNICULUS EXTENDER (MISCELLANEOUS) ×2
TRAXI PANNICULUS RETRACTOR (MISCELLANEOUS) ×2
TRAY FOLEY W/BAG SLVR 14FR LF (SET/KITS/TRAYS/PACK) ×2 IMPLANT
WATER STERILE IRR 1000ML POUR (IV SOLUTION) ×2 IMPLANT

## 2021-09-17 NOTE — Discharge Summary (Shared)
Postpartum Discharge Summary  Date of Service updated***     Patient Name: Krystal Hudson DOB: [birth year] MRN: [id number]  Date of admission: 09/17/2021 Delivery date:09/17/2021  Delivering provider: Truett Mainland  Date of discharge: 09/17/2021  Admitting diagnosis: RCS Intrauterine pregnancy: [redacted]w[redacted]d    Secondary diagnosis:  Active Problems:   S/P cesarean section  Additional problems: Hepatic steatosis, silent carrier alpha thal, maternal obesity    Discharge diagnosis: Term Pregnancy Delivered                                              Post partum procedures:{Postpartum procedures:23558} Augmentation: N/A Complications: None  Hospital course: Sceduled C/S   26y.o. yo G2P2002 at 357w0das admitted to the hospital 09/17/2021 for scheduled cesarean section with the following indication:Elective Repeat.Delivery details are as follows:  Membrane Rupture Time/Date: 10:27 AM ,09/17/2021   Delivery Method:C-Section, Vacuum Assisted  Details of operation can be found in separate operative note.  Patient had an uncomplicated postpartum course.  She is ambulating, tolerating a regular diet, passing flatus, and urinating well. Patient is discharged home in stable condition on  09/17/21        Newborn Data: Birth date:09/17/2021  Birth time:10:27 AM  Gender:Female  Living status:Living  Apgars:9 ,9  Weight:3590 g     Magnesium Sulfate received: No BMZ received: No Rhophylac:{Rhophylac received:30440032} MMR:{MMR:30440033} T-DaP:{Tdap:23962} Flu: N/A Transfusion:{Transfusion received:30440034}  Physical exam  Vitals:   09/17/21 0813 09/17/21 1149 09/17/21 1150 09/17/21 1200  BP: 119/83 (!) 113/51 (!) 75/64 (!) 107/59  Pulse: 70  70 72  Resp: 18 20 11  (!) 24  Temp: 97.8 F (36.6 C) 98 F (36.7 C) 98 F (36.7 C)   TempSrc: Oral Oral Oral   SpO2: 99% 100% 99% 99%  Weight: 111.1 kg     Height: 5' 4"  (1.626 m)      General: {Exam; general:21111117} Lochia: {Desc;  appropriate/inappropriate:30686::"appropriate"} Uterine Fundus: {Desc; firm/soft:30687} Incision: {Exam; incision:21111123} DVT Evaluation: {Exam; dvBMS:1115520}abs: Lab Results  Component Value Date   WBC 6.3 09/16/2021   HGB 9.4 (L) 09/16/2021   HCT 30.8 (L) 09/16/2021   MCV 77.2 (L) 09/16/2021   PLT 260 09/16/2021      Latest Ref Rng & Units 09/05/2021    7:58 PM  CMP  Glucose 70 - 99 mg/dL 89   BUN 6 - 20 mg/dL 8   Creatinine 0.44 - 1.00 mg/dL 0.91   Sodium 135 - 145 mmol/L 138   Potassium 3.5 - 5.1 mmol/L 4.0   Chloride 98 - 111 mmol/L 109   CO2 22 - 32 mmol/L 22   Calcium 8.9 - 10.3 mg/dL 8.4   Total Protein 6.5 - 8.1 g/dL 6.1   Total Bilirubin 0.3 - 1.2 mg/dL 0.2   Alkaline Phos 38 - 126 U/L 118   AST 15 - 41 U/L 19   ALT 0 - 44 U/L 17    Edinburgh Score:    12/30/2018    1:28 PM  Edinburgh Postnatal Depression Scale Screening Tool  I have been able to laugh and see the funny side of things. 3  I have looked forward with enjoyment to things. 2  I have blamed myself unnecessarily when things went wrong. 3  I have been anxious or worried for no good reason. 3  I have felt scared or panicky  for no good reason. 3  Things have been getting on top of me. 3  I have been so unhappy that I have had difficulty sleeping. 3  I have felt sad or miserable. 3  I have been so unhappy that I have been crying. 3  The thought of harming myself has occurred to me. 0  Edinburgh Postnatal Depression Scale Total 26     After visit meds:  Allergies as of 09/17/2021   No Known Allergies   Med Rec must be completed prior to using this [facility]***        Discharge home in stable condition Infant Feeding: Bottle and Breast Infant Disposition:{CHL IP OB HOME WITH PPGFQM:21031} Discharge instruction: per After Visit Summary and Postpartum booklet. Activity: Advance as tolerated. Pelvic rest for 6 weeks.  Diet: {OB YOFV:88677373} Future Appointments:No future  appointments. Follow up Visit:  Msg sent to [facility] by Gerlene Fee,   Please schedule this patient for a In person postpartum visit in 6 weeks with the following provider: MD and APP. Additional Postpartum F/U:Incision check 1 week  Low risk pregnancy complicated by:  maternal obesity Delivery mode:  C-Section, Vacuum Assisted  Anticipated Birth Control:  Unsure   09/17/2021 Naaman Plummer Autry-Lott, DO

## 2021-09-17 NOTE — Transfer of Care (Signed)
Immediate Anesthesia Transfer of Care Note  Patient: Krystal Hudson  Procedure(s) Performed: CESAREAN SECTION  Patient Location: PACU  Anesthesia Type:Spinal  Level of Consciousness: awake, alert  and oriented  Airway & Oxygen Therapy: Patient Spontanous Breathing  Post-op Assessment: Report given to RN and Post -op Vital signs reviewed and stable  Post vital signs: Reviewed and stable  Last Vitals:  Vitals Value Taken Time  BP 113/51 09/17/21 1152  Temp 36.7 C 09/17/21 1150  Pulse 64 09/17/21 1155  Resp 20 09/17/21 1155  SpO2 98 % 09/17/21 1155  Vitals shown include unvalidated device data.  Last Pain:  Vitals:   09/17/21 1150  TempSrc: Oral         Complications: No notable events documented.

## 2021-09-17 NOTE — Anesthesia Procedure Notes (Signed)
Spinal  Patient location during procedure: OR Start time: 09/17/2021 9:35 AM End time: 09/17/2021 9:47 AM Reason for block: surgical anesthesia Staffing Performed: anesthesiologist  Anesthesiologist: Nolon Nations, MD Performed by: Nolon Nations, MD Authorized by: Nolon Nations, MD   Preanesthetic Checklist Completed: patient identified, IV checked, site marked, risks and benefits discussed, surgical consent, monitors and equipment checked, pre-op evaluation and timeout performed Spinal Block Patient position: sitting Prep: DuraPrep and site prepped and draped Patient monitoring: heart rate, continuous pulse ox and blood pressure Approach: midline Location: L3-4 Injection technique: single-shot Needle Needle type: Spinocan  Needle gauge: 25 G Needle length: 10 cm Additional Notes Expiration date of kit checked and confirmed. Patient tolerated procedure well, without complications.

## 2021-09-17 NOTE — Op Note (Signed)
Krystal Hudson PROCEDURE DATE: 09/17/2021  PREOPERATIVE DIAGNOSIS: Intrauterine pregnancy at  [redacted]w[redacted]d weeks gestation;  previous low transverse cesarean  POSTOPERATIVE DIAGNOSIS: The same  PROCEDURE: Repeat Low Transverse Cesarean Section  SURGEON:  Dr. Candelaria Celeste  ASSISTANT: Dr. Salvadore Dom  INDICATIONS: Krystal Hudson is a 26 y.o. Y6T0354 at [redacted]w[redacted]d scheduled for cesarean section secondary to  previous cesarean section .  The risks of cesarean section discussed with the patient included but were not limited to: bleeding which may require transfusion or reoperation; infection which may require antibiotics; injury to bowel, bladder, ureters or other surrounding organs; injury to the fetus; need for additional procedures including hysterectomy in the event of a life-threatening hemorrhage; placental abnormalities wth subsequent pregnancies, incisional problems, thromboembolic phenomenon and other postoperative/anesthesia complications. The patient concurred with the proposed plan, giving informed written consent for the procedure.    FINDINGS:  Viable female infant in cephalic presentation.  Apgars 9 and 9, weight 7 pounds and 14.6 ounces.  Clear amniotic fluid.  Intact placenta, three vessel cord.  Normal uterus, fallopian tubes and ovaries bilaterally.  ANESTHESIA:    Spinal INTRAVENOUS FLUIDS: 2000 ml ESTIMATED BLOOD LOSS: 446 ml URINE OUTPUT:  600 ml SPECIMENS: Placenta sent to L&D COMPLICATIONS: None immediate  PROCEDURE IN DETAIL:  The patient received intravenous antibiotics and had sequential compression devices applied to her lower extremities while in the preoperative area.  She was then taken to the operating room where spinal anesthesia was administered (epidural anesthesia was dosed up to surgical level) and was found to be adequate. She was then placed in a dorsal supine position with a leftward tilt, and prepped and draped in a sterile manner.  A foley catheter was placed into her  bladder and attached to constant gravity, which drained clear fluid throughout.  After an adequate timeout was performed, a Pfannenstiel skin incision was made with scalpel and carried through to the underlying layer of fascia. The fascia was incised in the midline and this incision was extended bilaterally bluntly and using the Mayo scissors. Kocher clamps were applied to the superior aspect of the fascial incision and the underlying rectus muscles were dissected off bluntly initially following sharp dissection. A blunt dissection was carried out on the inferior aspect of the facial incision. The rectus muscles were separated in the midline bluntly and the peritoneum was entered bluntly. An Alexis retractor was placed to aid in visualization of the uterus.  Attention was turned to the lower uterine segment where a transverse hysterotomy was made with a scalpel and extended bilaterally bluntly. A vacuum was used to successfully deliver the head of the infant and body was delivered in the usual fashion. The cord was clamped and cut and infant was handed over to awaiting neonatology team. Cord blood was collected. Uterine massage was then administered and the placenta delivered intact with three-vessel cord. The uterus was then cleared of clot and debris.  The hysterotomy was closed with 0 Vicryl in a running unlocked fashion with the exception of visibly non-hemostatic tissue which was then re-enforced in locked fashion. Overall, excellent hemostasis was noted. The abdomen and the pelvis were cleared of all clot and debris and the Jon Gills was removed. Hemostasis was confirmed on all surfaces.  The peritoneum was reapproximated using 2-0 vicryl running stitches. The fascia was then closed using 0 Vicryl in a running fashion. The subcutaneous layer was reapproximated with plain gut and the skin was closed with 4-0 vicryl. The patient tolerated the procedure well. Sponge, lap,  instrument and needle counts were correct x  2. She was taken to the recovery room in stable condition.    Lavonda Jumbo, DO 09/17/2021 12:00 PM

## 2021-09-17 NOTE — Anesthesia Postprocedure Evaluation (Signed)
Anesthesia Post Note  Patient: Cathie Bonnell  Procedure(s) Performed: CESAREAN SECTION     Patient location during evaluation: PACU Anesthesia Type: Spinal Level of consciousness: awake and alert Pain management: pain level controlled Vital Signs Assessment: post-procedure vital signs reviewed and stable Respiratory status: spontaneous breathing Cardiovascular status: stable Anesthetic complications: no   No notable events documented.  Last Vitals:  Vitals:   09/17/21 1302 09/17/21 1425  BP: 115/65 116/61  Pulse: 65 60  Resp: 20 18  Temp: 36.6 C   SpO2: 98% 98%    Last Pain:  Vitals:   09/17/21 1425  TempSrc:   PainSc: 3                  Lewie Loron

## 2021-09-17 NOTE — Anesthesia Preprocedure Evaluation (Signed)
Anesthesia Evaluation  Patient identified by MRN, date of birth, ID band Patient awake    Reviewed: Allergy & Precautions, H&P , NPO status , Patient's Chart, lab work & pertinent test results  Airway Mallampati: III  TM Distance: >3 FB Neck ROM: full    Dental no notable dental hx. (+) Teeth Intact   Pulmonary neg pulmonary ROS,    Pulmonary exam normal breath sounds clear to auscultation       Cardiovascular hypertension, Normal cardiovascular exam Rhythm:regular Rate:Normal     Neuro/Psych negative neurological ROS  negative psych ROS   GI/Hepatic negative GI ROS, Neg liver ROS,   Endo/Other  Morbid obesity  Renal/GU negative Renal ROS  negative genitourinary   Musculoskeletal   Abdominal (+) + obese,   Peds  Hematology  (+) Blood dyscrasia, anemia ,   Anesthesia Other Findings   Reproductive/Obstetrics (+) Pregnancy                            Anesthesia Physical  Anesthesia Plan  ASA: 3  Anesthesia Plan: Spinal   Post-op Pain Management: Ofirmev IV (intra-op)* and Toradol IV (intra-op)*   Induction:   PONV Risk Score and Plan: 4 or greater and Ondansetron, Dexamethasone, Scopolamine patch - Pre-op and Treatment may vary due to age or medical condition  Airway Management Planned: Nasal Cannula and Natural Airway  Additional Equipment: None  Intra-op Plan:   Post-operative Plan:   Informed Consent: I have reviewed the patients History and Physical, chart, labs and discussed the procedure including the risks, benefits and alternatives for the proposed anesthesia with the patient or authorized representative who has indicated his/her understanding and acceptance.     Dental advisory given  Plan Discussed with: CRNA  Anesthesia Plan Comments:        Anesthesia Quick Evaluation

## 2021-09-17 NOTE — Lactation Note (Signed)
This note was copied from a baby's chart. Lactation Consultation Note  Patient Name: Krystal Hudson NVBTY'O Date: 09/17/2021 Reason for consult: Initial assessment;Term;Maternal endocrine disorder (C/S delivery) Age:26 hours Family member changed void diaper while LC was in the room. Birth Parent feeding choice MA:YOKHTX and supplementing with formula. Per Birth Parent , infant was given formula earlier today, this will be infant's 1st time latching at the breast.  Infant was at 3 hours since last feeding, Birth Parent was taught hand expression, LC used breast model and Birth Parent self expressed and infant was given 2 mls of colostrum by spoon and started cuing to breastfeed. Infant latched on Birth Parent's left breast using the football hold position breastfeeding for 10 minutes afterwards, infant was still cuing and infant re-latched on Birth Parents right breast  using the football hold position BF for  additional 8 minutes , the total feeding was 18 minutes. Per Birth Parent, she wants to focus on breastfeeding and latching infant but still supplement with formula, LC suggested Birth Parent latch infant on 1st breast and infant is still cuing offer 2nd breast in the same feeding, only  if infant still cues after latching to both breast , then supplement with formula. LC gave Birth Parent the  breastfeeding supplemental guidelines, Birth Parent understands day 1 supplemental amount per feeding is ( 5-7 mls) per feeding after latching at the breast. Birth Parent will continue to BF infant according to hunger cues, on demand, 8 to 12+ times within 24 hours, skin to skin. Birth Parent knows to call RN/LC if further latch assistance is needed. LC discussed importance of maternal rest, hydration and diet with Birth Parent. Mom made aware of O/P services, breastfeeding support groups, community resources, and our phone # for post-discharge questions.     Maternal Data Does the patient have  breastfeeding experience prior to this delivery?: Yes How long did the patient breastfeed?: Per Birth Parent, 1st child breastfeed for 3 months, infant had latch difficultes and she had low milk supply.  Feeding Mother's Current Feeding Choice: Breast Milk and Formula Nipple Type: Slow - flow  LATCH Score Latch: Grasps breast easily, tongue down, lips flanged, rhythmical sucking.  Audible Swallowing: A few with stimulation  Type of Nipple: Everted at rest and after stimulation  Comfort (Breast/Nipple): Soft / non-tender  Hold (Positioning): Assistance needed to correctly position infant at breast and maintain latch.  LATCH Score: 8   Lactation Tools Discussed/Used    Interventions Interventions: Breast feeding basics reviewed;Assisted with latch;Skin to skin;Breast compression;Adjust position;Support pillows;Position options;Expressed milk;Hand express;Education;LC Services brochure  Discharge Pump: Personal;Hands Free  Consult Status Consult Status: Follow-up Date: 09/18/21 Follow-up type: In-patient    Danelle Earthly 09/17/2021, 5:25 PM

## 2021-09-18 LAB — CBC
HCT: 24.9 % — ABNORMAL LOW (ref 36.0–46.0)
Hemoglobin: 7.9 g/dL — ABNORMAL LOW (ref 12.0–15.0)
MCH: 24.2 pg — ABNORMAL LOW (ref 26.0–34.0)
MCHC: 31.7 g/dL (ref 30.0–36.0)
MCV: 76.4 fL — ABNORMAL LOW (ref 80.0–100.0)
Platelets: 204 10*3/uL (ref 150–400)
RBC: 3.26 MIL/uL — ABNORMAL LOW (ref 3.87–5.11)
RDW: 16.3 % — ABNORMAL HIGH (ref 11.5–15.5)
WBC: 9.8 10*3/uL (ref 4.0–10.5)
nRBC: 0 % (ref 0.0–0.2)

## 2021-09-18 MED ORDER — DIPHENHYDRAMINE HCL 50 MG/ML IJ SOLN: 25.0000 mg | Freq: Once | INTRAMUSCULAR | Status: DC | PRN

## 2021-09-18 MED ORDER — ALBUTEROL SULFATE (2.5 MG/3ML) 0.083% IN NEBU: 2.5000 mg | INHALATION_SOLUTION | Freq: Once | RESPIRATORY_TRACT | Status: DC | PRN

## 2021-09-18 MED ORDER — EPINEPHRINE PF 1 MG/ML IJ SOLN: 0.3000 mg | Freq: Once | INTRAMUSCULAR | Status: DC | PRN

## 2021-09-18 MED ORDER — SODIUM CHLORIDE 0.9 % IV SOLN
INTRAVENOUS | Status: DC | PRN
Start: 2021-09-18 — End: 2021-09-19

## 2021-09-18 MED ORDER — METHYLPREDNISOLONE SODIUM SUCC 125 MG IJ SOLR: 125.0000 mg | Freq: Once | INTRAMUSCULAR | Status: DC | PRN

## 2021-09-18 MED ORDER — SODIUM CHLORIDE 0.9 % IV BOLUS: 500.0000 mL | Freq: Once | INTRAVENOUS | Status: DC | PRN

## 2021-09-18 MED ORDER — IRON SUCROSE 20 MG/ML IV SOLN
500.0000 mg | Freq: Once | INTRAVENOUS | Status: AC
  Administered 2021-09-18: 500 mg via INTRAVENOUS
  Filled 2021-09-18: qty 500

## 2021-09-18 NOTE — Social Work (Signed)
CSW received consult for hx of Depression. CSW met with MOB to offer support and complete assessment.    CSW entered the room introduced self and CSW role. CSW explained reason for visit, MOB was agreeable to visit. CSW inquired about how mom was feeling since giving birth. MOB reported she was a little sore but overall doing well. CSW inquired about MOB's Mental Health history. 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 CSW inquired about treatment. MOB reported she was in therapy about 3 years ago but has never taken medication for her depression. MOB reported she was worried about developing PPD again. CSW explained to MOB that  just because it happened after her first pregnancy does not mean she will experience it after this pregnancy. CSW provided resources for therapy, MOB reported she would feel comfortable reaching out to her OB or PCP if needs arise. CSW assessed for safety, MOB denied any SI or HI.    CSW provided education regarding the baby blues period vs. perinatal mood disorders, discussed treatment and gave resources for mental health follow up if concerns arise.  CSW recommends self-evaluation during the postpartum time period using the New Mom Checklist from Postpartum Progress and encouraged MOB to contact a medical professional if symptoms are noted at any time.    CSW provided review of Sudden Infant Death Syndrome (SIDS) precautions. MOB identified [facility] for infants follow up care. MOB reported they have a necessary item for the infant including a bassinet for safe sleep.  CSW identifies no further need for intervention and no barriers to discharge at this time.   Hector Taft, LCSWA Clinical Social Worker [phone]  

## 2021-09-18 NOTE — Progress Notes (Addendum)
POSTPARTUM PROGRESS NOTE  POD #1  Subjective:  Krystal Hudson is a 26 y.o. G2P2002 s/p rLTCS at [redacted]w[redacted]d.  She reports she doing well. No acute events overnight. She reports she is doing well. She denies any problems with ambulating, voiding or po intake. Denies nausea or vomiting. She has passed flatus. Pain is moderately controlled.  Lochia is minimal.  Objective: Blood pressure 124/68, pulse 68, temperature 98.6 F (37 C), temperature source Oral, resp. rate 17, height 5\' 4"  (1.626 m), weight 111.1 kg, last menstrual period 12/18/2020, SpO2 99 %, unknown if currently breastfeeding.  Physical Exam:  General: alert, cooperative and no distress Chest: no respiratory distress Heart:regular rate, distal pulses intact Abdomen: soft, nontender,  Uterine Fundus: firm, appropriately tender DVT Evaluation: No calf swelling or tenderness Extremities: No LE edema Skin: warm, dry; incision clean/dry/intact w/ honeycomb dressing in place  Recent Labs    09/16/21 0906 09/18/21 0517  HGB 9.4* 7.9*  HCT 30.8* 24.9*    Assessment/Plan: Krystal Hudson is a 26 y.o. G2P2002 s/p rLTCS at [redacted]w[redacted]d for term pregnancy.  POD#1 - Doing welll; pain moderately controlled. H/H appropriate  Routine postpartum care  OOB, ambulated  Lovenox for VTE prophylaxis Anemia: asymptomatic  -Start IV venofer, will likely need 2-3 doses Contraception: Undecided Feeding: Br/Bo  Dispo: Plan for discharge 8/3-8/4.   LOS: 1 day   12-26-1989, DO OB Fellow, Faculty Practice [facility], Center for [facility] 09/18/2021, 8:12 AM

## 2021-09-19 MED ORDER — IBUPROFEN 600 MG PO TABS: 600.0000 mg | ORAL_TABLET | Freq: Four times a day (QID) | ORAL | 0 refills | Status: DC | PRN

## 2021-09-19 MED ORDER — OXYCODONE HCL 5 MG PO TABS: 5.0000 mg | ORAL_TABLET | ORAL | 0 refills | Status: DC | PRN

## 2021-09-19 MED ORDER — ACETAMINOPHEN 325 MG PO TABS: 650.0000 mg | ORAL_TABLET | Freq: Four times a day (QID) | ORAL | 0 refills | Status: DC | PRN

## 2021-09-19 NOTE — Lactation Note (Signed)
This note was copied from a baby's chart. Lactation Consultation Note  Patient Name: Krystal Hudson OEUMP'N Date: 09/19/2021 Reason for consult: Follow-up assessment Age:26 hours  P2, Birth parent states mother is worried about her milk supply. Encouraged mother to offer breast before offering formula to help establish her milk supply. Each time she give formula or q 3hours mother should pump to stimulate her supply. Reviewed paced feeding. Reviewed engorgement care and monitoring voids/stools. Assisted with latch and baby latched off and on breast but did sustain.   Maternal Data Has patient been taught Hand Expression?: Yes  Feeding Mother's Current Feeding Choice: Breast Milk and Donor Milk  LATCH Score Latch: Grasps breast easily, tongue down, lips flanged, rhythmical sucking.  Audible Swallowing: A few with stimulation  Type of Nipple: Everted at rest and after stimulation  Comfort (Breast/Nipple): Soft / non-tender  Hold (Positioning): Assistance needed to correctly position infant at breast and maintain latch.  LATCH Score: 8    Interventions Interventions: Education  Discharge Discharge Education: Engorgement and breast care;Warning signs for feeding baby  Consult Status Consult Status: Complete Date: 09/19/21    Dahlia Byes [facility] 09/19/2021, 12:29 PM

## 2021-09-20 LAB — BIRTH TISSUE RECOVERY COLLECTION (PLACENTA DONATION)

## 2021-09-25 ENCOUNTER — Ambulatory Visit: Payer: 59

## 2021-09-25 ENCOUNTER — Telehealth (HOSPITAL_COMMUNITY): Payer: Self-pay | Admitting: *Deleted

## 2021-09-25 NOTE — Telephone Encounter (Signed)
Left phone voicemail message.  Duffy Rhody, RN 09-25-2021 at 2:07pm

## 2021-09-30 ENCOUNTER — Ambulatory Visit (INDEPENDENT_AMBULATORY_CARE_PROVIDER_SITE_OTHER): Payer: Medicaid Other

## 2021-09-30 DIAGNOSIS — Z5189 Encounter for other specified aftercare: Secondary | ICD-10-CM

## 2021-09-30 NOTE — Progress Notes (Addendum)
Subjective:     Krystal Hudson is a 26 y.o. female who presents to the clinic 2 weeks status post  LTCS  for  Incision check . Eating a regular diet without difficulty. Bowel movements are normal. The patient is not having any pain.  Review of Systems Pertinent items are noted in HPI.    Objective:    BP 136/86   Pulse 79   Ht 5\' 4"  (1.626 m)   Wt (!) 305 lb (138.3 kg)   Breastfeeding Yes   BMI 52.35 kg/m  General:  alert, cooperative, no distress, and morbidly obese  Abdomen: soft, bowel sounds active, non-tender  Incision:   healing well, no drainage, no erythema, no hernia, no seroma, no swelling, no dehiscence, incision well approximated     Assessment:    Doing well postoperatively.   Plan:    1. Continue any current medications. 2. Wound care discussed. 3. Activity restrictions: none 4. Anticipated return to work: not applicable. 5. Follow up: 5 weeks for PP visit.   , RMA

## 2021-11-06 ENCOUNTER — Ambulatory Visit (INDEPENDENT_AMBULATORY_CARE_PROVIDER_SITE_OTHER): Payer: Commercial Managed Care - HMO | Admitting: Student

## 2021-11-06 ENCOUNTER — Encounter: Payer: Self-pay | Admitting: Student

## 2021-11-06 DIAGNOSIS — O9902 Anemia complicating childbirth: Secondary | ICD-10-CM | POA: Diagnosis not present

## 2021-11-06 LAB — POCT URINE PREGNANCY: Preg Test, Ur: NEGATIVE

## 2021-11-06 NOTE — Progress Notes (Unsigned)
[place] Partum Visit Note  Krystal Hudson is a 26 y.o. G73P2002 female who presents for a postpartum visit. She is 7 weeks postpartum following a repeat cesarean section.  I have fully reviewed the prenatal and intrapartum course. The delivery was at 39.0 gestational weeks.  Anesthesia: epidural. Postpartum course has been unremarkable. Baby is doing well. Baby is feeding by bottle - Similac Sensitive RS. Bleeding thick, heavy lochia. Bowel function is normal. Bladder function is normal. Patient is not sexually active. Contraception method is IUD. Postpartum depression screening: negative.   The pregnancy intention screening data noted above was reviewed. Potential methods of contraception were discussed. The patient elected to proceed with Mirena IUD   Edinburgh Postnatal Depression Scale - 11/06/21 1606       Edinburgh Postnatal Depression Scale:  In the Past 7 Days   I have been able to laugh and see the funny side of things. 0    I have looked forward with enjoyment to things. 0    I have blamed myself unnecessarily when things went wrong. 0    I have been anxious or worried for no good reason. 2    I have felt scared or panicky for no good reason. 0    Things have been getting on top of me. 0    I have been so unhappy that I have had difficulty sleeping. 2    I have felt sad or miserable. 0    I have been so unhappy that I have been crying. 1    The thought of harming myself has occurred to me. 0    Edinburgh Postnatal Depression Scale Total 5             Health Maintenance Due  Topic Date Due   COVID-19 Vaccine (1) Never done   HPV VACCINES (1 - 2-dose series) Never done   INFLUENZA VACCINE  09/17/2021    The following portions of the patient's history were reviewed and updated as appropriate: allergies, current medications, past family history, past medical history, past social history, past surgical history, and problem list.  Review of Systems A comprehensive review  of systems was negative.  Objective:  BP (!) 147/85   Ht 5\' 4"  (1.626 m)   Wt (!) 307 lb (139.3 kg)   LMP 11/05/2021 (Exact Date)   Breastfeeding No   BMI 52.70 kg/m    General:  alert, cooperative, and appears stated age   Breasts:  normal  Lungs: clear to auscultation bilaterally  Heart:  regular rate and rhythm  Abdomen: soft, non-tender; bowel sounds normal; no masses,  no organomegaly   Wound well approximated incision  GU exam:  not indicated       Assessment:   1. Encounter for routine postpartum follow-up  Anemia of mother in pregnancy, delivered  Normal postpartum exam.   Plan:   Essential components of care per ACOG recommendations:  1.  Mood and well being: Patient with negative depression screening today. Reviewed local resources for support.  - Patient tobacco use? No.   - hx of drug use? No.    2. Infant care and feeding:  -Patient currently breastmilk feeding? No.  -Social determinants of health (SDOH) reviewed in EPIC. No concerns. No needs identified at this time  3. Sexuality, contraception and birth spacing - Patient does not want a pregnancy in the next year.  Desired family size is 2 children.  - Reviewed reproductive life planning. Reviewed contraceptive methods based on  pt preferences and effectiveness.  Patient desired IUD or IUS at follow-up visit. Patient is alone with infant today.  - Discussed birth spacing of 18 months  4. Sleep and fatigue -Encouraged family/partner/community support of 4 hrs of uninterrupted sleep to help with mood and fatigue  5. Physical Recovery  - Discussed patients delivery and complications. She describes her labor as good. - Patient had a C-section repeat; no problems after deliver. Patient had IV Iron s/p delivery.  - Patient has urinary incontinence? No. - Patient is safe to resume physical and sexual activity  6.  Health Maintenance - HM due items addressed Yes - Last pap smear  Diagnosis  Date Value  Ref Range Status  03/26/2021      - Negative for intraepithelial lesion or malignancy (NILM)   Pap smear not done at today's visit.  -Breast Cancer screening indicated? No.   7. Chronic Disease/Pregnancy Condition follow up: Anemia, CBC ordered today  - Follow-up appointment for IUD placement  Corlis Hove, [facility], [facility]  Center for [employer], [facility]

## 2021-11-07 LAB — CBC
Hematocrit: 35.8 % (ref 34.0–46.6)
Hemoglobin: 11.5 g/dL (ref 11.1–15.9)
MCH: 24.7 pg — ABNORMAL LOW (ref 26.6–33.0)
MCHC: 32.1 g/dL (ref 31.5–35.7)
MCV: 77 fL — ABNORMAL LOW (ref 79–97)
Platelets: 322 10*3/uL (ref 150–450)
RBC: 4.66 x10E6/uL (ref 3.77–5.28)
RDW: 18.8 % — ABNORMAL HIGH (ref 11.7–15.4)
WBC: 7.1 10*3/uL (ref 3.4–10.8)

## 2021-11-25 ENCOUNTER — Ambulatory Visit (INDEPENDENT_AMBULATORY_CARE_PROVIDER_SITE_OTHER): Payer: Commercial Managed Care - HMO | Admitting: Obstetrics and Gynecology

## 2021-11-25 ENCOUNTER — Encounter: Payer: Self-pay | Admitting: Obstetrics and Gynecology

## 2021-11-25 VITALS — BP 134/81 | HR 86 | Ht 64.0 in | Wt 306.0 lb

## 2021-11-25 DIAGNOSIS — Z30014 Encounter for initial prescription of intrauterine contraceptive device: Secondary | ICD-10-CM

## 2021-11-25 DIAGNOSIS — Z01812 Encounter for preprocedural laboratory examination: Secondary | ICD-10-CM | POA: Diagnosis not present

## 2021-11-25 LAB — POCT URINE PREGNANCY: Preg Test, Ur: NEGATIVE

## 2021-11-25 MED ORDER — LEVONORGESTREL 20 MCG/DAY IU IUD
1.0000 | INTRAUTERINE_SYSTEM | Freq: Once | INTRAUTERINE | Status: AC
  Administered 2021-11-25: 1 via INTRAUTERINE

## 2021-11-25 NOTE — Progress Notes (Signed)
   Established Patient Office Visit  Subjective   Patient ID: Krystal Hudson, female    DOB: 09/03/1995  Age: 26 y.o. MRN: [id number]  Chief Complaint  Patient presents with   Contraception    IUD Insertion    HPI Would like to proceed with lng-IUD today. Prior Cu-IUD with heavy menses. Feels she has completed child-bearing at this time and likes LARC. LMP 11/05/2021 without unprotected intercourse in the interim.  ROS    Objective:     BP 134/81   Pulse 86   Ht 5\' 4"  (3.875 m)   Wt (!) 306 lb (138.8 kg)   LMP 11/05/2021 (Exact Date)   Breastfeeding No   BMI 52.52 kg/m    Physical Exam Vitals and nursing note reviewed. Exam conducted with a chaperone present.  Constitutional:      Appearance: Normal appearance.  HENT:     Head: Normocephalic and atraumatic.  Cardiovascular:     Rate and Rhythm: Normal rate and regular rhythm.  Pulmonary:     Effort: Pulmonary effort is normal.     Breath sounds: Normal breath sounds.  Genitourinary:    General: Normal vulva.     Exam position: Lithotomy position.     Labia:        Right: No rash.        Left: No rash.      Urethra: No prolapse, urethral swelling or urethral lesion.     Vagina: Normal.     Cervix: No discharge.  Skin:    General: Skin is warm and dry.  Neurological:     General: No focal deficit present.     Mental Status: She is alert.  Psychiatric:        Mood and Affect: Mood normal.        Behavior: Behavior normal.        Thought Content: Thought content normal.        Judgment: Judgment normal.    No results found for any visits on 11/25/21.  IUD Insertion Procedure Note Patient identified, informed consent performed, consent signed.   Discussed risks of irregular bleeding, cramping, infection, malpositioning or misplacement of the IUD outside the uterus which may require further procedure such as laparoscopy. Also discussed >99% contraception efficacy, increased risk of ectopic pregnancy with  failure of method.  Time out was performed.  Urine pregnancy test negative.  Speculum placed in the vagina.  Cervix visualized.  Cleaned with Betadine x 2.  Anterior cervix infiltrated with 0.5cc of 1% lidocaine. Grasped anteriorly with a single tooth tenaculum.  There was initial bleeding from the infiltration site that was treated with pressure application. Paracervical block was administered and the endocervical canal instilled with using 1% lidocaine for a total of 5cc.  Mirena IUD placed per manufacturer's recommendations.  Strings trimmed to 3 cm. Tenaculum was removed, good hemostasis noted after application of silver nitrate to tenaculum sites.  Patient tolerated procedure well.   Patient was given post-procedure instructions.  She was advised to have backup contraception for one week.  Patient was also asked to check IUD strings periodically and follow up prn for IUD check.    Assessment & Plan:   1. Encounter for initial prescription of intrauterine contraceptive device (IUD) Pt now s/p uncomplicated lng-IUD insertion. Reviewed postprocedure instructions and expectations and approval for  7 years for contraception. Patient tolerated procedure well and all questions answered.  - POCT urine pregnancy    Darliss Cheney, MD 11/25/21

## 2021-11-25 NOTE — Progress Notes (Addendum)
26 y.o GYN presents for Mirena IUD insertion. Last unprotected sex was months ago.  LMP 11/05/2021  UPT today is Negative.  Administrations This Visit     levonorgestrel (MIRENA) 20 MCG/DAY IUD 1 each     Admin Date 11/25/2021 Action Given Dose 1 each Route Intrauterine Administered By Tamela Oddi, RMA

## 2021-12-23 ENCOUNTER — Encounter: Payer: Self-pay | Admitting: Advanced Practice Midwife

## 2022-07-17 ENCOUNTER — Ambulatory Visit: Payer: Medicaid Other | Admitting: Obstetrics and Gynecology

## 2022-07-28 ENCOUNTER — Ambulatory Visit (INDEPENDENT_AMBULATORY_CARE_PROVIDER_SITE_OTHER): Payer: Medicaid Other | Admitting: Advanced Practice Midwife

## 2022-07-28 ENCOUNTER — Encounter: Payer: Self-pay | Admitting: Advanced Practice Midwife

## 2022-07-28 VITALS — BP 129/87 | HR 67 | Ht 64.0 in | Wt 293.2 lb

## 2022-07-28 DIAGNOSIS — N938 Other specified abnormal uterine and vaginal bleeding: Secondary | ICD-10-CM

## 2022-07-28 DIAGNOSIS — Z30432 Encounter for removal of intrauterine contraceptive device: Secondary | ICD-10-CM

## 2022-07-28 DIAGNOSIS — Z3009 Encounter for other general counseling and advice on contraception: Secondary | ICD-10-CM

## 2022-07-28 MED ORDER — PHEXXI 1.8-1-0.4 % VA GEL: 5.0000 g | VAGINAL | 11 refills | Status: DC | PRN

## 2022-07-28 NOTE — Progress Notes (Signed)
Pt presents for IUD removal. IUD placed 11/2021. Pt reports AUB with IUD. Does not desire pregnancy, unsure about other BC method.

## 2022-07-28 NOTE — Progress Notes (Signed)
    GYNECOLOGY CLINIC PROCEDURE NOTE  [name] is a 27 y.o. [phone] here for Mirena IUD removal. No GYN concerns.  She has AUB and heavy bleeding with IUD, and had the same with her previous IUD.  She also reports difficulty losing weight with the IUD.  Last pap smear was on 03/26/21 and was normal.  IUD Removal  Patient was in the dorsal lithotomy position, normal external genitalia was noted.  A speculum was placed in the patient's vagina, normal discharge was noted, no lesions. The multiparous cervix was visualized, no lesions, no abnormal discharge.  The strings of the IUD were grasped and pulled using ring forceps. The IUD was removed in its entirety.  Patient tolerated the procedure well.    Patient will use Phexxi for contraception.  Rx sent.   Routine preventative health maintenance measures emphasized.   No follow-ups on file.   Sharen Counter, CNM 12:03 PM

## 2022-08-01 ENCOUNTER — Ambulatory Visit (INDEPENDENT_AMBULATORY_CARE_PROVIDER_SITE_OTHER): Payer: Medicaid Other | Admitting: Student

## 2022-08-01 VITALS — BP 133/89 | HR 79 | Ht 64.0 in | Wt 292.2 lb

## 2022-08-01 DIAGNOSIS — R5383 Other fatigue: Secondary | ICD-10-CM

## 2022-08-01 DIAGNOSIS — R7989 Other specified abnormal findings of blood chemistry: Secondary | ICD-10-CM | POA: Diagnosis not present

## 2022-08-01 DIAGNOSIS — N921 Excessive and frequent menstruation with irregular cycle: Secondary | ICD-10-CM | POA: Diagnosis not present

## 2022-08-01 NOTE — Progress Notes (Unsigned)
Pt states that she went to a weight loss center on 07/30/22 and had labs drawn. Pt states that they advised her that her thyroid levels wee abnormal and to follow up here. Pt has labs with her today. Pt had IUD removed on Monday, reports bleeding today.

## 2022-08-03 NOTE — Progress Notes (Signed)
History:  [name] is a 27 y.o. [id number] who presents to clinic today for follow-up after visit at Bariatric office. Patient is seeking weight-loss management and had preliminary lab work done at outside office. Office sent her to follow-up with OBGYN office in reference to elevated TSH finding. Patient was counseled that  elevated TSH was cause of weight gain and needed to be managed before she would be able to continue weight management plan. Patient has labs with her and TSH is ~5.2 . Patient reports insomnia, fatigue, irregular cycles, heavy periods, irritability, and difficulty losing weight.   The following portions of the patient's history were reviewed and updated as appropriate: allergies, current medications, family history, past medical history, social history, past surgical history and problem list.  Review of Systems:  Review of Systems  Constitutional:  Positive for malaise/fatigue.  Genitourinary:        Spotting  Psychiatric/Behavioral:  The patient has insomnia.   All other systems reviewed and are negative.     Objective:  Physical Exam BP 133/89   Pulse 79   Ht 5\' 4"  (1.626 m)   Wt 292 lb 3.2 oz (132.5 kg)   BMI 50.16 kg/m  Physical Exam Vitals and nursing note reviewed.  Constitutional:      Appearance: Normal appearance. She is obese.  Cardiovascular:     Rate and Rhythm: Normal rate.  Pulmonary:     Effort: Pulmonary effort is normal.  Genitourinary:    Comments: deferred Musculoskeletal:        General: Normal range of motion.  Skin:    General: Skin is warm and dry.  Neurological:     General: No focal deficit present.     Mental Status: She is alert and oriented to person, place, and time. Mental status is at baseline.  Psychiatric:        Mood and Affect: Mood normal.        Behavior: Behavior normal.        Thought Content: Thought content normal.        Judgment: Judgment normal.       Labs and Imaging No results found for this  or any previous visit (from the past 24 hour(s)).  No results found.  Health Maintenance Due  Topic Date Due   COVID-19 Vaccine (1) Never done    Labs, imaging and previous visits in Epic and Care Everywhere reviewed  Assessment & Plan:  1. Elevated TSH - Lab work from outside office reviewed with patient. Discussed that results are potentially a subclinical finding of hypothyroidism, but inconclusive based on only mildly elevated TSH and normal T4. Plan to repeat labs in 4-6 weeks and discuss results at follow-up visit, especially in setting of her current symptoms. - TSH+T4F+T3Free+ThyAbs+TPO+VD25; Future  2. Other fatigue - TSH+T4F+T3Free+ThyAbs+TPO+VD25; Future - CBC; Future - Vitamin D (25 hydroxy); Future - Comprehensive metabolic panel; Future  3. Menorrhagia with irregular cycle - Discussed common causes of irregular, heavy cycles such as hormone dysregulation and/or anatomical structures. Plan for lab work and imaging to assess further. Will discuss results at follow-up visit.  - US PELVIC COMPLETE WITH TRANSVAGINAL; Future - Prolactin; Future - TestT+TestF+SHBG; Future - Hemoglobin A1c; Future - Follicle stimulating hormone; Future - Beta hCG quant (ref lab); Future   Approximately 20 minutes of total time was spent with this patient on counseling and coordination of care  Return in about 1 month (around 08/31/2022) for 3-4 days following lab work, IN-PERSON.  Corlis Hove,  NP 08/03/2022 11:52 AM

## 2022-08-11 ENCOUNTER — Inpatient Hospital Stay: Admission: RE | Admit: 2022-08-11 | Payer: Medicaid Other | Source: Ambulatory Visit

## 2022-08-22 ENCOUNTER — Other Ambulatory Visit: Payer: Medicaid Other

## 2022-08-27 ENCOUNTER — Ambulatory Visit: Payer: Medicaid Other | Admitting: Student

## 2022-08-27 NOTE — Progress Notes (Deleted)
  History:  Ms. Aniyah Nobis is a 27 y.o. [phone] who presents to clinic today for ***   The following portions of the patient's history were reviewed and updated as appropriate: allergies, current medications, family history, past medical history, social history, past surgical history and problem list.  Review of Systems:  ROS    Objective:  Physical Exam There were no vitals taken for this visit. Physical Exam    Labs and Imaging No results found for this or any previous visit (from the past 24 hour(s)).  No results found.  Health Maintenance Due  Topic Date Due   COVID-19 Vaccine (1) Never done    Labs, imaging and previous visits in Epic and Care Everywhere reviewed  Assessment & Plan:  1. Menorrhagia with irregular cycle ***  2. Elevated TSH ***  3. Other fatigue ***    Approximately *** minutes of total time was spent with this patient on ***  No follow-ups on file.  Corlis Hove, NP 08/27/2022 8:03 AM

## 2022-08-29 ENCOUNTER — Other Ambulatory Visit: Payer: Medicaid Other

## 2022-09-07 IMAGING — US US OB < 14 WEEKS - US OB TV
1 series · 15 of 16 positions shown · non-contrast
Comparison: None.

CLINICAL DATA: 25-year-old pregnant female with abdominal pain.
Estimated gestational age last menstrual period equals 5 weeks 5
days.

EXAM:
OBSTETRIC <14 WK US AND TRANSVAGINAL OB US
TECHNIQUE: Both transabdominal and transvaginal ultrasound examinations were
performed for complete evaluation of the gestation as well as the
maternal uterus, adnexal regions, and pelvic cul-de-sac.
Transvaginal technique was performed to assess early pregnancy.

[Series 1: us ob < 14 weeks - us ob tv · 16 acquisitions, 15 frames shown]
[im 1/16]
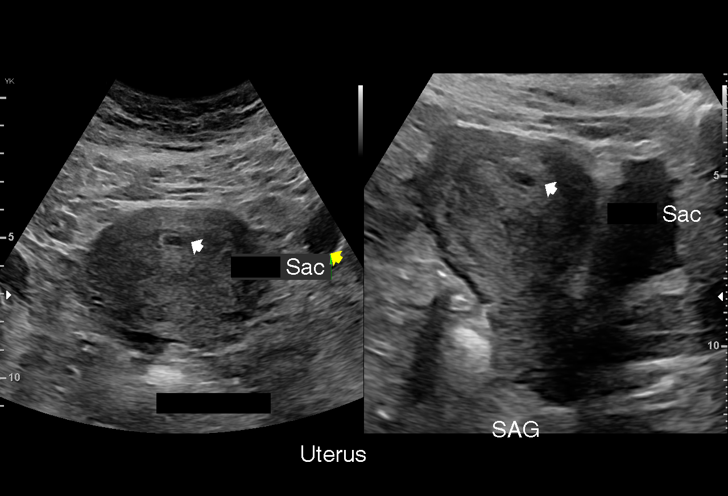
[im 2/16]
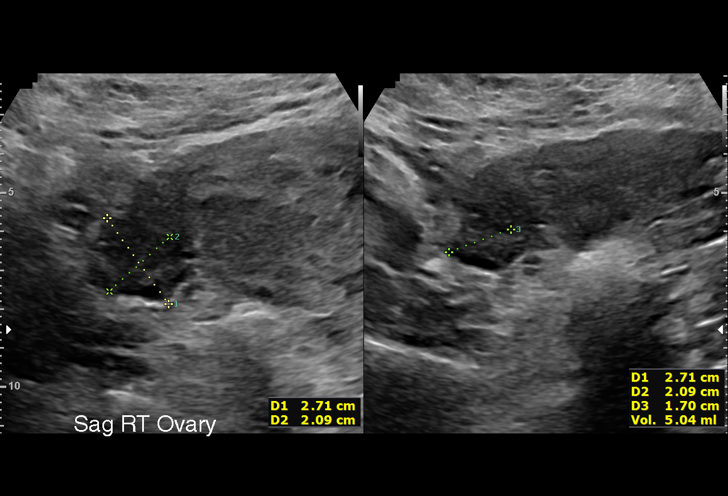
[im 3/16]
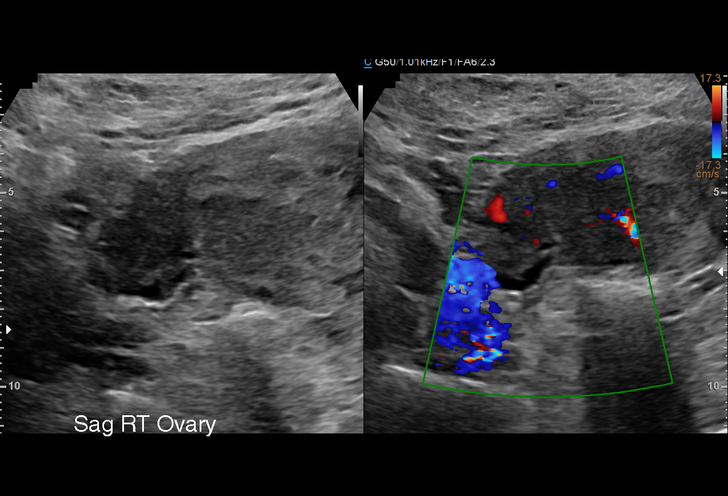
[im 4/16]
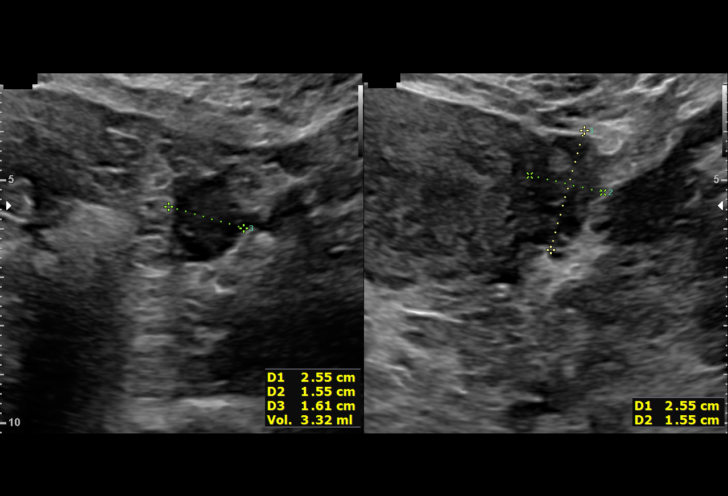
[im 5/16]
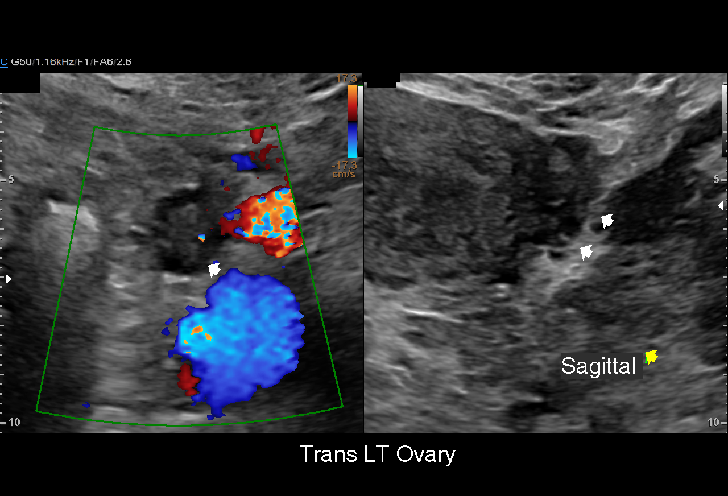
[im 6/16]
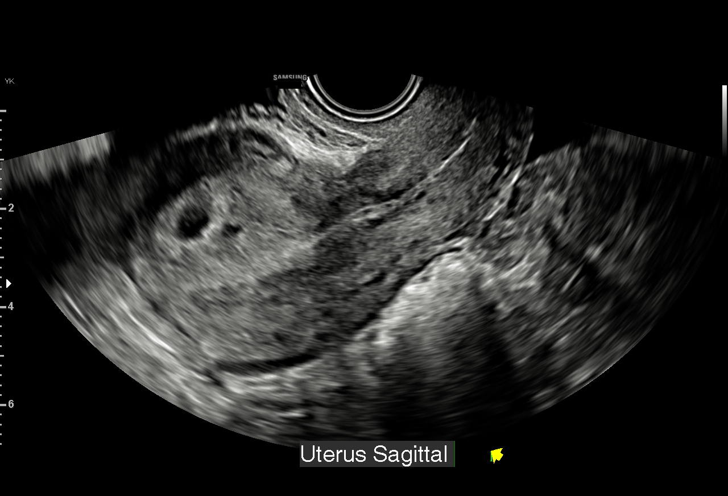
[im 7/16]
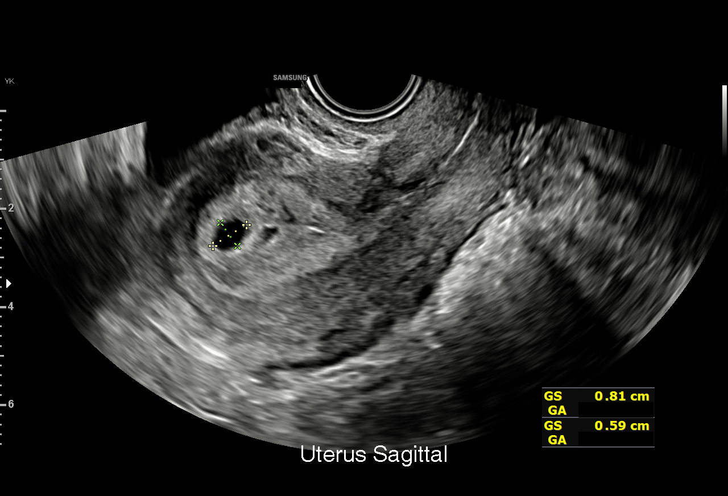
[im 9/16]
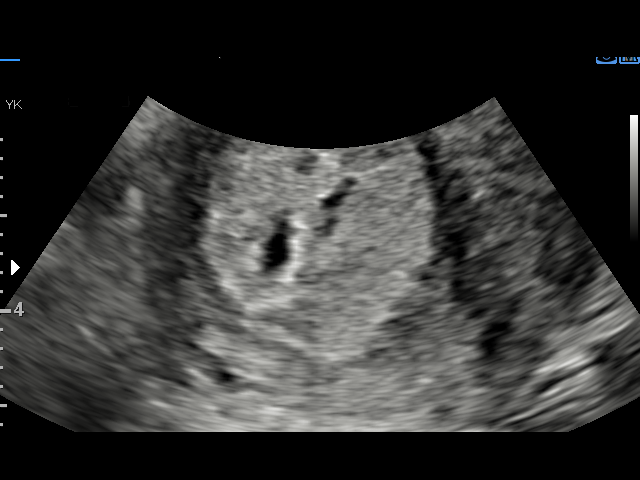
[im 10/16]
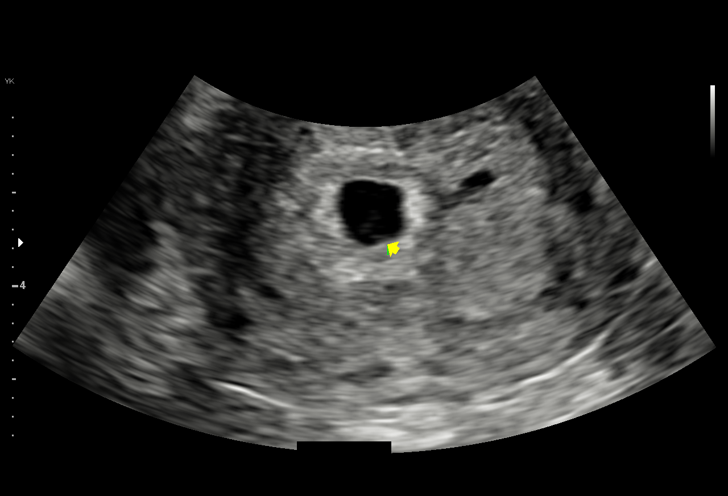
[im 11/16]
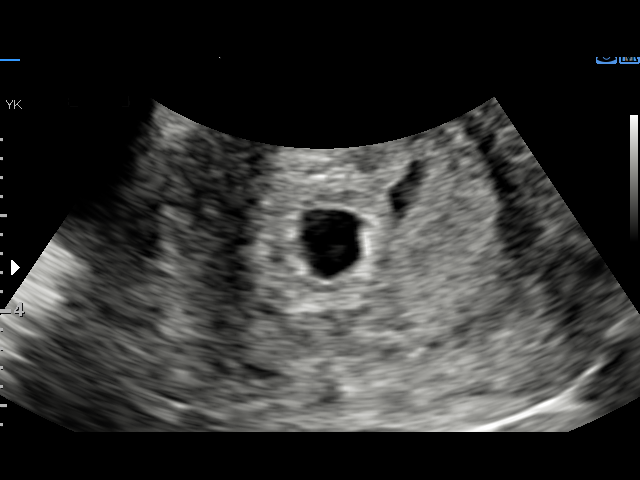
[im 12/16]
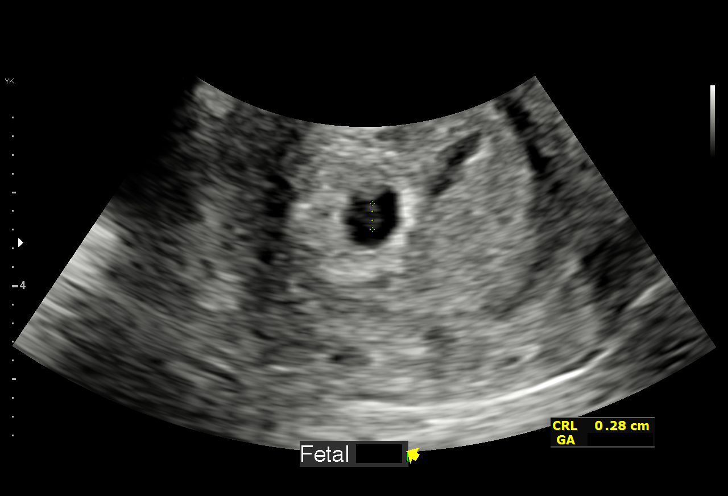
[im 13/16]
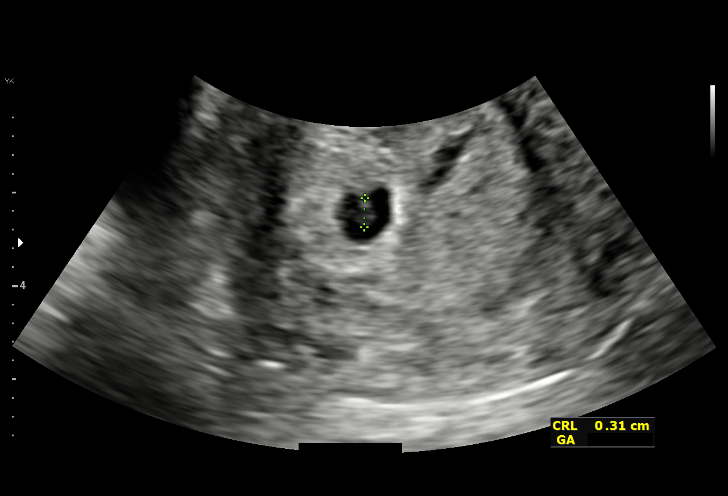
[im 14/16]
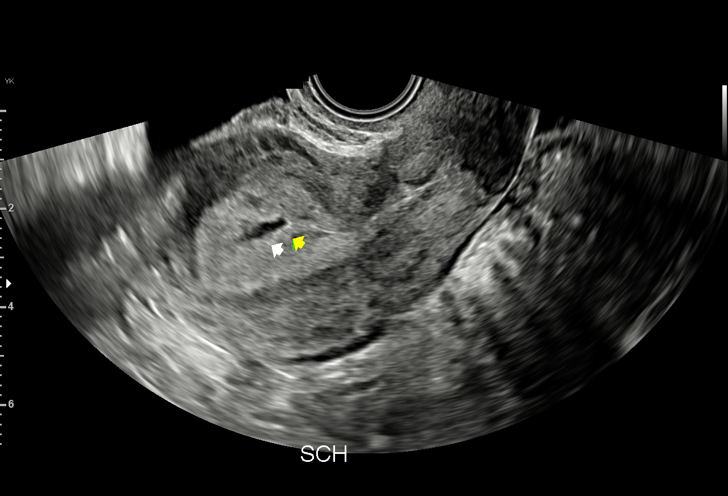
[im 15/16]
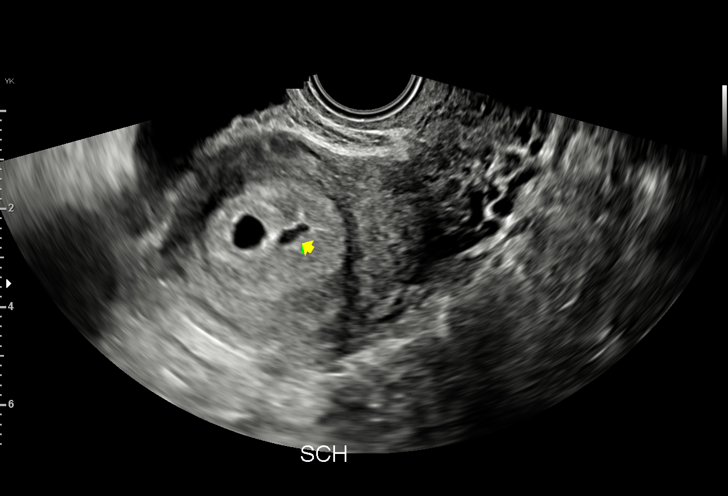
[im 16/16]
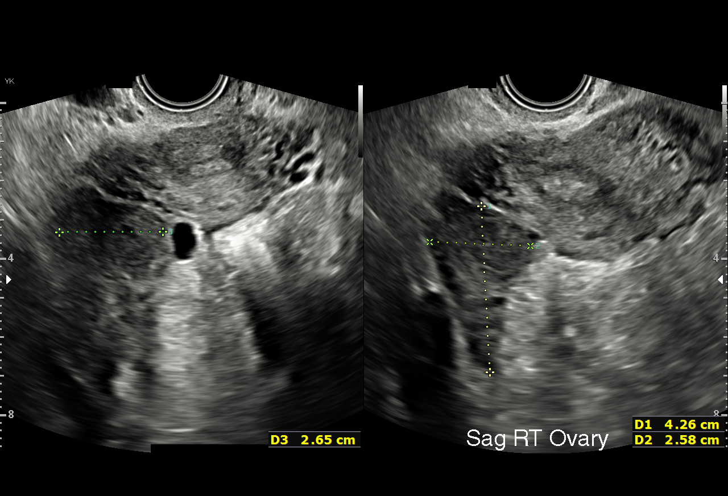

[15 of 16 positions shown; findings below may reference images not displayed]

FINDINGS: Intrauterine gestational sac: Single

Yolk sac:  Present

Embryo:  Present

Cardiac Activity: Not identified

Heart Rate: N/a bpm

CRL:  3 mm   5 w   5 d                  US EDC: 09/24/2021

Subchorionic hemorrhage:  Small subchorionic hemorrhage.

Maternal uterus/adnexae: Normal ovaries and uterus.
IMPRESSION: 1. 1. Single intrauterine gestation with embryo. No cardiac activity
demonstrated at this early date.
2. Estimated gestational age by crown rump length equals 5 weeks 5
days.

## 2023-05-04 ENCOUNTER — Encounter: Payer: Self-pay | Admitting: Family Medicine

## 2023-05-04 ENCOUNTER — Ambulatory Visit: Payer: Medicaid Other | Attending: Family Medicine | Admitting: Family Medicine

## 2023-05-04 VITALS — BP 124/81 | HR 76 | Ht 64.0 in | Wt 309.8 lb

## 2023-05-04 DIAGNOSIS — R7303 Prediabetes: Secondary | ICD-10-CM | POA: Diagnosis not present

## 2023-05-04 DIAGNOSIS — R5383 Other fatigue: Secondary | ICD-10-CM

## 2023-05-04 DIAGNOSIS — Z13228 Encounter for screening for other metabolic disorders: Secondary | ICD-10-CM

## 2023-05-04 DIAGNOSIS — Z131 Encounter for screening for diabetes mellitus: Secondary | ICD-10-CM

## 2023-05-04 DIAGNOSIS — E559 Vitamin D deficiency, unspecified: Secondary | ICD-10-CM | POA: Diagnosis not present

## 2023-05-04 DIAGNOSIS — R7989 Other specified abnormal findings of blood chemistry: Secondary | ICD-10-CM | POA: Diagnosis not present

## 2023-05-04 NOTE — Patient Instructions (Signed)
 VISIT SUMMARY:  Today, we discussed your ongoing difficulty with losing weight and persistent fatigue. We reviewed your current lifestyle changes and planned further investigations to understand the underlying causes.  YOUR PLAN:  -OBESITY: Obesity means having an excessive amount of body fat. We will conduct blood tests to check your kidney and liver function, vitamin D levels, and A1c (a measure of blood sugar levels). We will also check your cholesterol levels. Additionally, we will refer you to a weight management clinic for specialized support. If diabetes is diagnosed, medications like Ozempic or Mounjaro may be considered.  -FATIGUE: Fatigue is a feeling of constant tiredness or weakness. We will investigate possible causes such as anemia, low vitamin D, or thyroid dysfunction by ordering blood tests to check your blood count and thyroid function.  -GENERAL HEALTH MAINTENANCE: We recommend regular follow-ups and screenings, including annual physical exams. Please contact the clinic if you have any acute issues.  INSTRUCTIONS:  We will communicate your lab results within 24 hours. The weight management clinic will contact you within two to three weeks. If you do not hear from them, please reach out to our clinic for assistance.

## 2023-05-04 NOTE — Progress Notes (Signed)
 Subjective:  Patient ID: Krystal Hudson, female    DOB: Aug 24, 1995  Age: 28 y.o. MRN: [id number]  CC: New Patient (Initial Visit) (Discuss weight)     Discussed the use of AI scribe software for clinical note transcription with the patient, who gave verbal consent to proceed.  History of Present Illness The patient, a mother of two, presents with difficulty losing weight since the birth of her second child almost two years ago. Despite efforts to lose weight, including regular gym visits and dietary changes such as eliminating fast food and soda, the patient reports minimal weight loss. Prior to her first pregnancy, the patient weighed approximately 220 pounds. She was able to lose 20 pounds after her first child, but has struggled to lose weight after her second child.  The patient reports feeling constantly tired, despite getting adequate sleep, and denies any known snoring or symptoms of sleep apnea. The patient does not have a family history of weight problems.    Past Medical History:  Diagnosis Date   Anemia    Headache    Hepatic steatosis    Hypertension    Morbid obesity (HCC)     Past Surgical History:  Procedure Laterality Date   CESAREAN SECTION N/A 11/20/2018   Procedure: CESAREAN SECTION;  Surgeon: Allie Bossier, MD;  Location: [facility];  Service: Obstetrics;  Laterality: N/A;   CESAREAN SECTION N/A 09/17/2021   Procedure: CESAREAN SECTION;  Surgeon: Levie Heritage, DO;  Location: [facility];  Service: Obstetrics;  Laterality: N/A;   TYMPANOSTOMY TUBE PLACEMENT      Family History  Problem Relation Age of Onset   Healthy Mother    Hypertension Maternal Grandmother    Hypertension Maternal Grandfather    Asthma Neg Hx    Cancer Neg Hx    Diabetes Neg Hx    Heart disease Neg Hx    Stroke Neg Hx     Social History   Socioeconomic History   Marital status: Single    Spouse name: Not on file   Number of children: Not on file   Years of education: 12th    Highest education level: 12th grade  Occupational History   Occupation: Investment banker, corporate  Tobacco Use   Smoking status: Never   Smokeless tobacco: Never  Vaping Use   Vaping status: Former   Quit date: 04/17/2018  Substance and Sexual Activity   Alcohol use: Not Currently   Drug use: Never   Sexual activity: Yes    Birth control/protection: None  Other Topics Concern   Not on file  Social History Narrative   Not on file   Social Drivers of Health   Financial Resource Strain: Low Risk  (05/04/2023)   Overall Financial Resource Strain (CARDIA)    Difficulty of Paying Living Expenses: Not very hard  Food Insecurity: No Food Insecurity (05/04/2023)   Hunger Vital Sign    Worried About Running Out of Food in the Last Year: Never true    Ran Out of Food in the Last Year: Never true  Transportation Needs: No Transportation Needs (05/04/2023)   PRAPARE - Administrator, Civil Service (Medical): No    Lack of Transportation (Non-Medical): No  Physical Activity: Patient Declined (05/04/2023)   Exercise Vital Sign    Days of Exercise per Week: Patient declined    Minutes of Exercise per Session: Patient declined  Stress: Stress Concern Present (05/04/2023)   [employer] of Occupational Health -  Occupational Stress Questionnaire    Feeling of Stress : To some extent  Social Connections: Socially Isolated (05/04/2023)   Social Connection and Isolation Panel [NHANES]    Frequency of Communication with Friends and Family: Once a week    Frequency of Social Gatherings with Friends and Family: Once a week    Attends Religious Services: Never    Database administrator or Organizations: No    Attends Engineer, structural: Never    Marital Status: Living with partner    No Known Allergies  Outpatient Medications Prior to Visit  Medication Sig Dispense Refill   acetaminophen (TYLENOL) 325 MG tablet Take 2 tablets (650 mg total) by mouth every 6 (six) hours as  needed. (Patient not taking: Reported on 05/04/2023) 30 tablet 0   Blood Pressure Monitoring (BLOOD PRESSURE KIT) DEVI 1 Device by Does not apply route once a week. (Patient not taking: Reported on 09/12/2021) 1 each 0   cyclobenzaprine (FLEXERIL) 10 MG tablet Take 1 tablet (10 mg total) by mouth 3 (three) times daily as needed for muscle spasms. (Patient not taking: Reported on 09/30/2021) 20 tablet 0   famotidine (PEPCID) 20 MG tablet Take 1 tablet (20 mg total) by mouth 2 (two) times daily. (Patient not taking: Reported on 09/30/2021) 60 tablet 3   ibuprofen (ADVIL) 600 MG tablet Take 1 tablet (600 mg total) by mouth every 6 (six) hours as needed for moderate pain. (Patient not taking: Reported on 05/04/2023) 30 tablet 0   Iron Polysacch Cmplx-B12-FA 150-0.025-1 MG CAPS Take 1 capsule by mouth every other day. (Patient not taking: Reported on 09/30/2021) 30 capsule 5   Lactic Ac-Citric Ac-Pot Bitart (PHEXXI) 1.8-1-0.4 % GEL Place 5 g vaginally as needed. (Patient not taking: Reported on 05/04/2023) 60 g 11   ondansetron (ZOFRAN-ODT) 4 MG disintegrating tablet Take 1 tablet (4 mg total) by mouth every 6 (six) hours as needed for nausea or vomiting. (Patient not taking: Reported on 09/11/2021) 30 tablet 1   oxyCODONE (OXY IR/ROXICODONE) 5 MG immediate release tablet Take 1 tablet (5 mg total) by mouth every 4 (four) hours as needed for severe pain or breakthrough pain. (Patient not taking: Reported on 05/04/2023) 30 tablet 0   Prenat-Fe Poly-Methfol-FA-DHA (VITAFOL ULTRA) 29-0.6-0.4-200 MG CAPS Take 1 capsule by mouth daily before breakfast. (Patient not taking: Reported on 09/30/2021) 90 capsule 4   No facility-administered medications prior to visit.     ROS Review of Systems  Constitutional:  Negative for activity change and appetite change.  HENT:  Negative for sinus pressure and sore throat.   Respiratory:  Negative for chest tightness, shortness of breath and wheezing.   Cardiovascular:  Negative  for chest pain and palpitations.  Gastrointestinal:  Negative for abdominal distention, abdominal pain and constipation.  Genitourinary: Negative.   Musculoskeletal: Negative.   Psychiatric/Behavioral:  Negative for behavioral problems and dysphoric mood.     Objective:  BP 124/81   Pulse 76   Ht 5\' 4"  (1.626 m)   Wt (!) 309 lb 12.8 oz (140.5 kg)   LMP 04/18/2023   SpO2 98%   Breastfeeding Unknown   BMI 53.18 kg/m      05/04/2023    9:21 AM 08/01/2022   10:04 AM 08/01/2022    9:57 AM  BP/Weight  Systolic BP 124 133 136  Diastolic BP 81 89 89  Wt. (Lbs) 309.8  292.2  BMI 53.18 kg/m2  50.16 kg/m2      Physical Exam Constitutional:  Appearance: She is well-developed. She is obese.  Cardiovascular:     Rate and Rhythm: Normal rate.     Heart sounds: Normal heart sounds. No murmur heard. Pulmonary:     Effort: Pulmonary effort is normal.     Breath sounds: Normal breath sounds. No wheezing or rales.  Chest:     Chest wall: No tenderness.  Abdominal:     General: Bowel sounds are normal. There is no distension.     Palpations: Abdomen is soft. There is no mass.     Tenderness: There is no abdominal tenderness.  Musculoskeletal:        General: Normal range of motion.     Right lower leg: No edema.     Left lower leg: No edema.  Neurological:     Mental Status: She is alert and oriented to person, place, and time.  Psychiatric:        Mood and Affect: Mood normal.        Latest Ref Rng & Units 09/17/2021    2:40 PM 09/05/2021    7:58 PM 05/26/2021    2:30 PM  CMP  Glucose 70 - 99 mg/dL  89  95   BUN 6 - 20 mg/dL  8  5   Creatinine 1.61 - 1.00 mg/dL 0.96  0.45  4.09   Sodium 135 - 145 mmol/L  138  135   Potassium 3.5 - 5.1 mmol/L  4.0  4.5   Chloride 98 - 111 mmol/L  109  106   CO2 22 - 32 mmol/L  22  22   Calcium 8.9 - 10.3 mg/dL  8.4  8.9   Total Protein 6.5 - 8.1 g/dL  6.1  6.3   Total Bilirubin 0.3 - 1.2 mg/dL  0.2  0.6   Alkaline Phos 38 - 126 U/L   118  57   AST 15 - 41 U/L  19  19   ALT 0 - 44 U/L  17  16     Lipid Panel  No results found for: "CHOL", "TRIG", "HDL", "CHOLHDL", "VLDL", "LDLCALC", "LDLDIRECT"  CBC    Component Value Date/Time   WBC 7.1 11/06/2021 1641   WBC 9.8 09/18/2021 0517   RBC 4.66 11/06/2021 1641   RBC 3.26 (L) 09/18/2021 0517   HGB 11.5 11/06/2021 1641   HCT 35.8 11/06/2021 1641   PLT 322 11/06/2021 1641   MCV 77 (L) 11/06/2021 1641   MCH 24.7 (L) 11/06/2021 1641   MCH 24.2 (L) 09/18/2021 0517   MCHC 32.1 11/06/2021 1641   MCHC 31.7 09/18/2021 0517   RDW 18.8 (H) 11/06/2021 1641   LYMPHSABS 1.8 09/05/2021 1958   LYMPHSABS 2.1 03/26/2021 1154   MONOABS 0.5 09/05/2021 1958   EOSABS 0.0 09/05/2021 1958   EOSABS 0.0 03/26/2021 1154   BASOSABS 0.0 09/05/2021 1958   BASOSABS 0.0 03/26/2021 1154    Lab Results  Component Value Date   HGBA1C 5.5 05/03/2018       Assessment & Plan Obesity Difficulty losing weight despite lifestyle changes. Highest recorded weight. No significant family history. Possible metabolic or endocrine issues. - Order blood tests for kidney function, liver function, vitamin D, and A1c. - Order cholesterol test. - Refer to weight management clinic. - Consider Ozempic or Mounjaro if diabetes is diagnosed.  Fatigue Persistent fatigue despite adequate sleep. Sleep apnea unlikely due to lack of sleep apnea symptoms. Investigate anemia, low vitamin D, thyroid dysfunction. - Order blood tests for blood count and  thyroid function.  General Health Maintenance Advised regular follow-ups and screenings. - Schedule annual physical exams. - Advise to contact clinic for acute issues.  Follow-up Lab results to be communicated within 24 hours. Weight management clinic to contact within two to three weeks. - Review lab results and communicate findings. - Ensure weight management clinic contacts her.       No orders of the defined types were placed in this  encounter.   Follow-up: No follow-ups on file.       Hoy Register, MD, FAAFP. [facility] [place], [place] [phone]   05/04/2023, 9:45 AM

## 2023-05-05 ENCOUNTER — Encounter: Payer: Self-pay | Admitting: Family Medicine

## 2023-05-05 ENCOUNTER — Other Ambulatory Visit: Payer: Self-pay | Admitting: Family Medicine

## 2023-05-05 ENCOUNTER — Telehealth: Payer: Self-pay

## 2023-05-05 DIAGNOSIS — R7989 Other specified abnormal findings of blood chemistry: Secondary | ICD-10-CM | POA: Insufficient documentation

## 2023-05-05 DIAGNOSIS — R7303 Prediabetes: Secondary | ICD-10-CM | POA: Insufficient documentation

## 2023-05-05 DIAGNOSIS — E559 Vitamin D deficiency, unspecified: Secondary | ICD-10-CM | POA: Insufficient documentation

## 2023-05-05 LAB — CMP14+EGFR
ALT: 41 IU/L — ABNORMAL HIGH (ref 0–32)
AST: 34 IU/L (ref 0–40)
Albumin: 4.3 g/dL (ref 4.0–5.0)
Alkaline Phosphatase: 81 IU/L (ref 44–121)
BUN/Creatinine Ratio: 12 (ref 9–23)
BUN: 9 mg/dL (ref 6–20)
Bilirubin Total: <0.2 mg/dL (ref 0.0–1.2)
CO2: 21 mmol/L (ref 20–29)
Calcium: 9.6 mg/dL (ref 8.7–10.2)
Chloride: 105 mmol/L (ref 96–106)
Creatinine, Ser: 0.73 mg/dL (ref 0.57–1.00)
Globulin, Total: 2.5 g/dL (ref 1.5–4.5)
Glucose: 97 mg/dL (ref 70–99)
Potassium: 4.1 mmol/L (ref 3.5–5.2)
Sodium: 142 mmol/L (ref 134–144)
Total Protein: 6.8 g/dL (ref 6.0–8.5)
eGFR: 116 mL/min/{1.73_m2} (ref >59)

## 2023-05-05 LAB — CBC WITH DIFFERENTIAL/PLATELET
Basophils Absolute: 0 10*3/uL (ref 0.0–0.2)
Basos: 1 %
EOS (ABSOLUTE): 0.1 10*3/uL (ref 0.0–0.4)
Eos: 1 %
Hematocrit: 39.3 % (ref 34.0–46.6)
Hemoglobin: 12.4 g/dL (ref 11.1–15.9)
Immature Grans (Abs): 0 10*3/uL (ref 0.0–0.1)
Immature Granulocytes: 0 %
Lymphocytes Absolute: 2.2 10*3/uL (ref 0.7–3.1)
Lymphs: 46 %
MCH: 26.7 pg (ref 26.6–33.0)
MCHC: 31.6 g/dL (ref 31.5–35.7)
MCV: 85 fL (ref 79–97)
Monocytes Absolute: 0.4 10*3/uL (ref 0.1–0.9)
Monocytes: 8 %
Neutrophils Absolute: 2.1 10*3/uL (ref 1.4–7.0)
Neutrophils: 44 %
Platelets: 294 10*3/uL (ref 150–450)
RBC: 4.65 x10E6/uL (ref 3.77–5.28)
RDW: 14.5 % (ref 11.7–15.4)
WBC: 4.8 10*3/uL (ref 3.4–10.8)

## 2023-05-05 LAB — TSH: TSH: 5.62 u[IU]/mL — ABNORMAL HIGH (ref 0.450–4.500)

## 2023-05-05 LAB — LP+NON-HDL CHOLESTEROL
Cholesterol, Total: 226 mg/dL — ABNORMAL HIGH (ref 100–199)
HDL: 51 mg/dL (ref >39)
LDL Chol Calc (NIH): 152 mg/dL — ABNORMAL HIGH (ref 0–99)
Total Non-HDL-Chol (LDL+VLDL): 175 mg/dL — ABNORMAL HIGH (ref 0–129)
Triglycerides: 126 mg/dL (ref 0–149)
VLDL Cholesterol Cal: 23 mg/dL (ref 5–40)

## 2023-05-05 LAB — HEMOGLOBIN A1C
Est. average glucose Bld gHb Est-mCnc: 126 mg/dL
Hgb A1c MFr Bld: 6 % — ABNORMAL HIGH (ref 4.8–5.6)

## 2023-05-05 LAB — VITAMIN D 25 HYDROXY (VIT D DEFICIENCY, FRACTURES): Vit D, 25-Hydroxy: 23.3 ng/mL — ABNORMAL LOW (ref 30.0–100.0)

## 2023-05-05 LAB — T4, FREE: Free T4: 1 ng/dL (ref 0.82–1.77)

## 2023-05-05 MED ORDER — ERGOCALCIFEROL 1.25 MG (50000 UT) PO CAPS: 50000.0000 [IU] | ORAL_CAPSULE | ORAL | 0 refills | Status: DC

## 2023-05-05 NOTE — Telephone Encounter (Signed)
 Copied from CRM [id number]. Topic: Clinical - Prescription Issue >> May 05, 2023 12:38 PM Victorino Dike T wrote: Reason for CRM: ergocalciferol (DRISDOL) 1.25 MG (50000 UT) capsule- pharmacy has  not received yet

## 2023-05-05 NOTE — Telephone Encounter (Signed)
 ergocalciferol (DRISDOL) 1.25 MG (50000 UT) capsule  sent to phamacy today by patient provider.ergocalciferol (DRISDOL) 1.25 MG (50000 UT) capsule

## 2023-05-07 ENCOUNTER — Encounter (INDEPENDENT_AMBULATORY_CARE_PROVIDER_SITE_OTHER): Payer: Self-pay

## 2023-05-07 ENCOUNTER — Other Ambulatory Visit: Payer: Self-pay | Admitting: Family Medicine

## 2023-05-07 ENCOUNTER — Encounter: Payer: Self-pay | Admitting: Family Medicine

## 2023-05-07 NOTE — Telephone Encounter (Signed)
 Pt requesting referral to weight management. VIT d script has been sent.

## 2023-05-13 ENCOUNTER — Ambulatory Visit (INDEPENDENT_AMBULATORY_CARE_PROVIDER_SITE_OTHER): Admitting: Adult Health

## 2023-05-13 ENCOUNTER — Encounter (INDEPENDENT_AMBULATORY_CARE_PROVIDER_SITE_OTHER): Payer: Self-pay | Admitting: Adult Health

## 2023-05-13 VITALS — BP 108/74 | HR 91 | Temp 98.3°F | Ht 65.0 in | Wt 300.0 lb

## 2023-05-13 DIAGNOSIS — E559 Vitamin D deficiency, unspecified: Secondary | ICD-10-CM

## 2023-05-13 DIAGNOSIS — Z0000 Encounter for general adult medical examination without abnormal findings: Secondary | ICD-10-CM

## 2023-05-13 DIAGNOSIS — R7303 Prediabetes: Secondary | ICD-10-CM | POA: Diagnosis not present

## 2023-05-13 DIAGNOSIS — Z6841 Body Mass Index (BMI) 40.0 and over, adult: Secondary | ICD-10-CM | POA: Diagnosis not present

## 2023-05-13 DIAGNOSIS — Z0289 Encounter for other administrative examinations: Secondary | ICD-10-CM

## 2023-05-13 NOTE — Progress Notes (Signed)
 Office: [phone]  /  Fax: [phone]   Initial Visit  Krystal Hudson was seen in clinic today to evaluate for obesity. She is interested in losing weight to improve overall health and reduce the risk of weight related complications. She presents today to review program treatment options, initial physical assessment, and evaluation.     She was referred by: PCP  When asked what else they would like to accomplish? She states: Adopt healthier eating patterns, Improve energy levels and physical activity, Improve quality of life, and Current weight 200 lbs, goal weight 200lbs.  When asked how has your weight affected you? She states: Contributed to orthopedic problems or mobility issues, Having fatigue, Having poor endurance, and Problems with eating patterns  Weight history: She reports steady weight gain since the birth of her son (2020) and the birth of her daughter (2023)  Some associated conditions: Prediabetes and Vitamin D Deficiency  Contributing factors: Moderate to high levels of stress, Reduced physical activity, Eating patterns, and Childbirth  Weight promoting medications identified: Contraceptives or hormonal therapy  Current nutrition plan: Other: 1800 cal/day eating plan  Current level of physical activity: Walking 60 minutes and Strength training 30 minutes  Current or previous pharmacotherapy: Phentermine  Response to medication:  Lost weight initially then plateaued .    Past medical history includes:   Past Medical History:  Diagnosis Date   Anemia    Headache    Hepatic steatosis    Hypertension    Morbid obesity (HCC)      Objective:   BP 108/74   Pulse 91   Temp 98.3 F (36.8 C)   Ht 5\' 5"  (1.651 m)   Wt 300 lb (136.1 kg)   LMP 04/18/2023   SpO2 97%   BMI 49.92 kg/m  She was weighed on the bioimpedance scale: Body mass index is 49.92 kg/m.  Peak Weight:300 , Body Fat%:50.5, Visceral Fat Rating:16, Weight trend over the last 12 months:  Increasing  General:  Alert, oriented and cooperative. Patient is in no acute distress.  Respiratory: Normal respiratory effort, no problems with respiration noted   Gait: able to ambulate independently  Mental Status: Normal mood and affect. Normal behavior. Normal judgment and thought content.   DIAGNOSTIC DATA REVIEWED:  BMET    Component Value Date/Time   NA 142 05/04/2023 1002   K 4.1 05/04/2023 1002   CL 105 05/04/2023 1002   CO2 21 05/04/2023 1002   GLUCOSE 97 05/04/2023 1002   GLUCOSE 89 09/05/2021 1958   BUN 9 05/04/2023 1002   CREATININE 0.73 05/04/2023 1002   CALCIUM 9.6 05/04/2023 1002   GFRNONAA >60 09/17/2021 1440   GFRAA >60 11/18/2018 1912   Lab Results  Component Value Date   HGBA1C 6.0 (H) 05/04/2023   HGBA1C 5.5 05/03/2018   No results found for: "INSULIN" CBC    Component Value Date/Time   WBC 4.8 05/04/2023 1002   WBC 9.8 09/18/2021 0517   RBC 4.65 05/04/2023 1002   RBC 3.26 (L) 09/18/2021 0517   HGB 12.4 05/04/2023 1002   HCT 39.3 05/04/2023 1002   PLT 294 05/04/2023 1002   MCV 85 05/04/2023 1002   MCH 26.7 05/04/2023 1002   MCH 24.2 (L) 09/18/2021 0517   MCHC 31.6 05/04/2023 1002   MCHC 31.7 09/18/2021 0517   RDW 14.5 05/04/2023 1002   Iron/TIBC/Ferritin/ %Sat No results found for: "IRON", "TIBC", "FERRITIN", "IRONPCTSAT" Lipid Panel     Component Value Date/Time   CHOL 226 (H)  05/04/2023 1002   TRIG 126 05/04/2023 1002   HDL 51 05/04/2023 1002   LDLCALC 152 (H) 05/04/2023 1002   Hepatic Function Panel     Component Value Date/Time   PROT 6.8 05/04/2023 1002   ALBUMIN 4.3 05/04/2023 1002   AST 34 05/04/2023 1002   ALT 41 (H) 05/04/2023 1002   ALKPHOS 81 05/04/2023 1002   BILITOT <0.2 05/04/2023 1002      Component Value Date/Time   TSH 5.620 (H) 05/04/2023 1002     Assessment and Plan:   Vitamin D deficiency  Healthcare maintenance  Prediabetes  Morbid obesity (HCC), Starting BMI 50.0   ESTABLISH WITH HWW    Obesity Treatment / Action Plan:  Patient will work on garnering support from family and friends to begin weight loss journey. Will work on eliminating or reducing the presence of highly palatable, calorie dense foods in the home. Will complete provided nutritional and psychosocial assessment questionnaire before the next appointment. Will be scheduled for indirect calorimetry to determine resting energy expenditure in a fasting state.  This will allow [place] to create a reduced calorie, high-protein meal plan to promote loss of fat mass while preserving muscle mass. Counseled on the health benefits of losing 5%-15% of total body weight. Was counseled on nutritional approaches to weight loss and benefits of reducing processed foods and consuming plant-based foods and high quality protein as part of nutritional weight management. Was counseled on pharmacotherapy and role as an adjunct in weight management.   Obesity Education Performed Today:  She was weighed on the bioimpedance scale and results were discussed and documented in the synopsis.  We discussed obesity as a disease and the importance of a more detailed evaluation of all the factors contributing to the disease.  We discussed the importance of long term lifestyle changes which include nutrition, exercise and behavioral modifications as well as the importance of customizing this to her specific health and social needs.  We discussed the benefits of reaching a healthier weight to alleviate the symptoms of existing conditions and reduce the risks of the biomechanical, metabolic and psychological effects of obesity.  Makiya Jeune appears to be in the action stage of change and states they are ready to start intensive lifestyle modifications and behavioral modifications.  30 minutes was spent today on this visit including the above counseling, pre-visit chart review, and post-visit documentation.  Reviewed by clinician on day of visit:  allergies, medications, problem list, medical history, surgical history, family history, social history, and previous encounter notes pertinent to obesity diagnosis.  Nygeria Lager d. Shalita Notte, NP-C

## 2023-06-11 ENCOUNTER — Ambulatory Visit (INDEPENDENT_AMBULATORY_CARE_PROVIDER_SITE_OTHER): Admitting: Family Medicine

## 2023-06-16 ENCOUNTER — Encounter (INDEPENDENT_AMBULATORY_CARE_PROVIDER_SITE_OTHER): Payer: Self-pay | Admitting: Family Medicine

## 2023-06-16 ENCOUNTER — Ambulatory Visit (INDEPENDENT_AMBULATORY_CARE_PROVIDER_SITE_OTHER): Admitting: Family Medicine

## 2023-06-16 VITALS — BP 109/75 | HR 77 | Temp 98.1°F | Ht 65.0 in | Wt 290.0 lb

## 2023-06-16 DIAGNOSIS — K760 Fatty (change of) liver, not elsewhere classified: Secondary | ICD-10-CM

## 2023-06-16 DIAGNOSIS — R7989 Other specified abnormal findings of blood chemistry: Secondary | ICD-10-CM

## 2023-06-16 DIAGNOSIS — R7303 Prediabetes: Secondary | ICD-10-CM | POA: Diagnosis not present

## 2023-06-16 DIAGNOSIS — R0602 Shortness of breath: Secondary | ICD-10-CM | POA: Diagnosis not present

## 2023-06-16 DIAGNOSIS — E785 Hyperlipidemia, unspecified: Secondary | ICD-10-CM | POA: Diagnosis not present

## 2023-06-16 DIAGNOSIS — Z6841 Body Mass Index (BMI) 40.0 and over, adult: Secondary | ICD-10-CM | POA: Diagnosis not present

## 2023-06-16 DIAGNOSIS — R5383 Other fatigue: Secondary | ICD-10-CM | POA: Diagnosis not present

## 2023-06-16 DIAGNOSIS — E559 Vitamin D deficiency, unspecified: Secondary | ICD-10-CM | POA: Diagnosis not present

## 2023-06-16 DIAGNOSIS — Z1331 Encounter for screening for depression: Secondary | ICD-10-CM

## 2023-06-16 DIAGNOSIS — E782 Mixed hyperlipidemia: Secondary | ICD-10-CM

## 2023-06-16 NOTE — Assessment & Plan Note (Signed)
 Elevated triglycerides and LDL at last draw.  HDL also at goal.  Will discuss labs at next appointment.  No significant family history of hyperlipidemia or early CAD.

## 2023-06-16 NOTE — Assessment & Plan Note (Signed)
 Last A1c was 6.0.  No blood sugar issues during pregnancies.  Lab in March was first time she has been prediabetic.  Will order fasting insulin level today.

## 2023-06-16 NOTE — Assessment & Plan Note (Signed)
 Ultrasound done in 2023 showing steatosis but no gallstones.  Last ALT slightly elevated.  Will repeat CMP in 2 months.

## 2023-06-16 NOTE — Assessment & Plan Note (Signed)
 TSH 5.2 on recent labs.  Fatigue but no heat intolerance but gravitates toward cold sensistivity.

## 2023-06-16 NOTE — Assessment & Plan Note (Signed)
 On vitamin currently.  Last level of 23 in March.  Will repeat level in July.

## 2023-06-16 NOTE — Progress Notes (Signed)
 Chief Complaint:  Obesity   Subjective:  Krystal Hudson (MR# [id number]) Hudson a 28 y.o. female who presents for evaluation and treatment of obesity and related comorbidities.   Krystal Hudson Hudson currently in the action stage of change and ready to dedicate time achieving and maintaining a healthier weight. Krystal Hudson Hudson interested in becoming our patient and working on intensive lifestyle modifications including (but not limited to) diet and exercise for weight loss.  She Hudson in college for radiology and Hudson doing her prereqs now and then has the whole program to do.  Lives at home with son Krystal Hudson (4), daughter Krystal Hudson (1) and boyfriend Krystal Hudson.  She mentions everyone Hudson supportive of her, they eat meals together and everyone Hudson be changing how they eat with her.  Desired weight Hudson less than 200lbs; that weight prior to first pregnancy.  Gained 60lbs with first pregnancy and 40lbs with second.   Not emotionally eating anymore.   Krystal Hudson has been struggling with her weight. She has been unsuccessful in either losing weight, maintaining weight loss, or reaching her healthy weight goal.  Patient reports eating outside the home maybe twice a week with fast food or takeout maybe twice a week.  She here her mother Hudson a majority of the grocery shopping once a week but Hudson not use a grocery list.  She Hudson like to cook and voices her biggest obstacles to cooking or work and going to school and so she Hudson almost never home.  She mainly craves chocolate around 11 PM.  Her biggest food dislikes her mushrooms and seafood.  She Hudson consider herself a picky eater.  She Hudson not snack frequently between meals but Hudson snack frequently at night after dinner.  The food she tends to snack on her fruit, chips, and popcorn.  She tends to skip breakfast 3-4 times a week due to time.  She considers her worse food habits tending to forget to eat when busy.  She mentions she feels she struggles with poor food choices but Hudson not have excessive  hunger or portion control issues.  Food Recall: Lunch around 1pm of peanut butter and jelly sandwich or [place] or ham (1 slice, L/T/O/Avo/ Mayo).  Occasionally Hudson drink a coke or water.  Feels satisfied.  Next time she eats Hudson be around 6pm.  Rice, beans and meat (1/4 cup beans, 1 cup rice, 1 spoonful of meat)- feels full from this.  No snacks after dinner.   Indirect Calorimeter completed today shows a RMR: 2261 . Her calculated basal metabolic rate Hudson 4098 thus her basal metabolic rate Hudson better than expected.  Other Fatigue Krystal Hudson denies daytime somnolence and admits to waking up still tired. Patient has a history of symptoms of daytime fatigue. Krystal Hudson generally gets 8-10 hours of sleep per night, and states that she has generally restful sleep. Snoring Hudson present. Apneic episodes are not present. Epworth Sleepiness Score Hudson 4.   Shortness of Breath Krystal Hudson notes increasing shortness of breath with exercising and seems to be worsening over time with weight gain. She notes getting out of breath sooner with activity than she used to. This has not gotten worse recently. Krystal Hudson denies shortness of breath at rest or orthopnea.  Depression Screen Krystal Hudson Food and Mood (modified PHQ-9) score was 10.     06/16/2023    8:15 AM  Depression screen PHQ 2/9  Decreased Interest 3  Down, Depressed, Hopeless 0  PHQ - 2 Score 3  Altered sleeping 1  Tired,  decreased energy 3  Change in appetite 1  Feeling bad or failure about yourself  0  Trouble concentrating 2  Moving slowly or fidgety/restless 0  Suicidal thoughts 0  PHQ-9 Score 10     Objective:  Vitals Temp: 98.1 F (36.7 C) BP: 109/75 Pulse Rate: 77 SpO2: 98 %   Anthropometric Measurements Height: 5\' 5"  (1.651 m) Weight: 290 lb (131.5 kg) BMI (Calculated): 48.26 Starting Weight: 290 lb Peak Weight: 300 lb Waist Measurement : 48 inches   Body Composition  Body Fat %: 46.5 % Fat Mass (lbs): 134.8 lbs Muscle Mass (lbs):  147.4 lbs Total Body Water (lbs): 99.4 lbs Visceral Fat Rating : 14   Other Clinical Data RMR: 2261 Fasting: no Labs: no Today's Visit #: 1 Starting Date: 06/16/23    EKG: Normal sinus rhythm, rate 76.  General: Cooperative, alert, well developed, in no acute distress. HEENT: Conjunctivae and lids unremarkable. Cardiovascular: Regular rhythm.  Lungs: Normal work of breathing. Neurologic: No focal deficits.   Lab Results  Component Value Date   CREATININE 0.73 05/04/2023   BUN 9 05/04/2023   NA 142 05/04/2023   K 4.1 05/04/2023   CL 105 05/04/2023   CO2 21 05/04/2023   Lab Results  Component Value Date   ALT 41 (H) 05/04/2023   AST 34 05/04/2023   ALKPHOS 81 05/04/2023   BILITOT <0.2 05/04/2023   Lab Results  Component Value Date   HGBA1C 6.0 (H) 05/04/2023   HGBA1C 5.5 05/03/2018   No results found for: "INSULIN " Lab Results  Component Value Date   TSH 5.620 (H) 05/04/2023   Lab Results  Component Value Date   CHOL 226 (H) 05/04/2023   HDL 51 05/04/2023   LDLCALC 152 (H) 05/04/2023   TRIG 126 05/04/2023   Lab Results  Component Value Date   WBC 4.8 05/04/2023   HGB 12.4 05/04/2023   HCT 39.3 05/04/2023   MCV 85 05/04/2023   PLT 294 05/04/2023   No results found for: "IRON ", "TIBC", "FERRITIN"  Assessment and Plan:   Other Fatigue  Krystal Hudson feel that her weight Hudson causing her energy to be lower than it should be. Fatigue may be related to obesity, depression or many other causes. Labs Hudson be ordered, and in the meanwhile, Krystal Hudson focus on self care including making healthy food choices, increasing physical activity and focusing on stress reduction.  Shortness of Breath  Krystal Hudson Hudson feel that she gets out of breath more easily that she used to when she exercises. Krystal Hudson shortness of breath appears to be obesity related and exercise induced. She has Hudson to work on weight loss and gradually increase exercise to treat her exercise  induced shortness of breath. Hudson continue to monitor closely.   Problem List Items Addressed This Visit       Digestive   Hepatic steatosis   Ultrasound done in 2023 showing steatosis but no gallstones.  Last ALT slightly elevated.  Hudson repeat CMP in 2 months.        Other   Vitamin D  deficiency   On vitamin currently.  Last level of 23 in March.  Hudson repeat level in July.      Abnormal TSH   TSH 5.2 on recent labs.  Fatigue but no heat intolerance but gravitates toward cold sensistivity.       Prediabetes   Last A1c was 6.0.  No blood sugar issues during pregnancies.  Lab in March was first time she  has been prediabetic.  Hudson order fasting insulin  level today.      Hyperlipidemia   Elevated triglycerides and LDL at last draw.  HDL also at goal.  Hudson discuss labs at next appointment.  No significant family history of hyperlipidemia or early CAD.      Other Visit Diagnoses       Other fatigue    -  Primary   Relevant Orders   EKG 12-Lead     SOBOE (shortness of breath on exertion)         Depression screening         Morbid obesity (HCC), Starting BMI 50.0         BMI 45.0-49.9, adult (HCC)           Krystal Hudson currently in the action stage of change and her goal Hudson to continue with weight loss efforts. I recommend Krystal Hudson treatment plan as follows:  She has Hudson to Category 4 Plan with 8oz of meat at dinner.  Exercise goals: All adults should avoid inactivity. Some activity Hudson better than none, and adults who participate in any amount of physical activity, gain some health benefits.  Behavioral modification strategies:increasing lean protein intake, decreasing simple carbohydrates, no skipping meals, meal planning and cooking strategies, and planning for success  She was informed of the importance of frequent follow-up visits to maximize her success with intensive lifestyle modifications for her multiple health conditions. She was informed  we would discuss her lab results at her next visit unless there Hudson a critical issue that needs to be addressed sooner. Krystal Hudson to keep her next visit at the Hudson upon time to discuss these results.  Labs ordered with plans to discuss at the next visit.   Attestation Statements:  Reviewed by clinician on day of visit: allergies, medications, problem list, medical history, surgical history, family history, social history, and previous encounter notes.  This Hudson the patient's first visit at [facility]. The patient's NEW PATIENT PACKET was reviewed at length. Included in the packet: current and past health history, medications, allergies, ROS, gynecologic history (women only), surgical history, family history, social history, weight history, weight loss surgery history (for those that have had weight loss surgery), nutritional evaluation, mood and food questionnaire, PHQ9, Epworth questionnaire, sleep habits questionnaire, patient life and health improvement goals questionnaire. These Hudson all be scanned into the patient's chart under media.   During the visit, I independently reviewed the patient's EKG, bioimpedance scale results, and indirect calorimeter results. I used this information to tailor a meal plan for the patient that Hudson help her to lose weight and Hudson improve her obesity-related conditions going forward. I performed a medically necessary appropriate examination and/or evaluation. I discussed the assessment and treatment plan with the patient. The patient was provided an opportunity to ask questions and all were answered. The patient Hudson with the plan and demonstrated an understanding of the instructions. Labs were ordered at this visit and Hudson be reviewed at the next visit unless more critical results need to be addressed immediately. Clinical information was updated and documented in the EMR.     Donaciano Frizzle, MD

## 2023-06-17 LAB — T3: T3, Total: 148 ng/dL (ref 71–180)

## 2023-06-17 LAB — INSULIN, RANDOM: INSULIN: 20.3 u[IU]/mL (ref 2.6–24.9)

## 2023-06-25 ENCOUNTER — Ambulatory Visit: Attending: Family Medicine | Admitting: Family Medicine

## 2023-06-25 ENCOUNTER — Encounter: Payer: Self-pay | Admitting: Family Medicine

## 2023-06-25 VITALS — BP 111/74 | HR 71 | Ht 65.0 in | Wt 295.8 lb

## 2023-06-25 DIAGNOSIS — M7989 Other specified soft tissue disorders: Secondary | ICD-10-CM | POA: Diagnosis not present

## 2023-06-25 DIAGNOSIS — R5383 Other fatigue: Secondary | ICD-10-CM | POA: Diagnosis not present

## 2023-06-25 DIAGNOSIS — R7989 Other specified abnormal findings of blood chemistry: Secondary | ICD-10-CM | POA: Diagnosis not present

## 2023-06-25 DIAGNOSIS — E559 Vitamin D deficiency, unspecified: Secondary | ICD-10-CM

## 2023-06-25 DIAGNOSIS — R202 Paresthesia of skin: Secondary | ICD-10-CM

## 2023-06-25 DIAGNOSIS — R7303 Prediabetes: Secondary | ICD-10-CM

## 2023-06-25 NOTE — Patient Instructions (Signed)
 VISIT SUMMARY:  Today, you were seen for symptoms including weight gain, hair loss, fatigue, and numbness in your hands and feet. We discussed your current health status and potential underlying causes for your symptoms.  YOUR PLAN:  -HYPOTHYROIDISM: Hypothyroidism is a condition where your thyroid  gland does not produce enough thyroid  hormone, leading to symptoms like weight gain, hair loss, and fatigue. We will check your thyroid -stimulating hormone (TSH) levels with a blood test. If your TSH is elevated, we will start you on a medication called levothyroxine to help manage your symptoms. The dosage will be adjusted based on your TSH levels.  -FATIGUE: Your chronic fatigue may be related to hypothyroidism, anemia, or vitamin deficiencies. We will investigate further based on your test results.  -NUMBNESS IN HANDS AND FEET: The numbness, swelling, and pain in your hands and feet could be due to a vitamin B12 deficiency, autoimmune conditions, or gout. We will conduct blood tests to check for these conditions.  -PREDIABETES: Prediabetes means your blood sugar levels are higher than normal but not high enough to be classified as diabetes. Your A1c level is 6.0%, which confirms prediabetes. We will monitor your condition and provide guidance on lifestyle changes to prevent the progression to diabetes.  INSTRUCTIONS:  Please complete the following lab tests: TSH, vitamin B12, and blood work for autoimmune conditions and gout. Follow up with us  once the results are available so we can discuss the next steps. Continue with your regular gym attendance and consider dietary changes to help manage your prediabetes. If you experience any new or worsening symptoms, please contact our office.

## 2023-06-25 NOTE — Progress Notes (Signed)
 Subjective:  Patient ID: Krystal Hudson, female    DOB: 09/14/1995  Age: 28 y.o. MRN: [id number]  CC: Numbness (Numbness in hands and feet./Recheck thyroid  labs)     Discussed the use of AI scribe software for clinical note transcription with the patient, who gave verbal consent to proceed.  History of Present Illness Krystal Hudson is a 28 year old female with prediabetes, obesity, vitamin D  deficiency, abnormal TSH who presents with symptoms of weight gain, hair loss, and fatigue.  She experiences significant hair loss, leading to patches of alopecia, and persistent fatigue, feeling constantly tired. Despite regular gym attendance, she has gained weight.  Currently under the care of the medical weight management clinic.  Numbness in her hands and feet has persisted for approximately one year. In her hands, numbness is localized around the fingers with swelling in her left hand and pain upon squeezing. In her feet, numbness is primarily on the top and worsens with treadmill walking. She has prediabetes with an A1c of 6.0%. She is not taking any multivitamins,  supplements, or medications for her symptoms. Occasionally, she experiences shoulder pain, knee pain, and unexplainable body aches. Her last menstrual period was last month, with another expected soon.    Past Medical History:  Diagnosis Date   Anemia    Headache    Hepatic steatosis    High cholesterol    Hypertension    Morbid obesity (HCC)    Prediabetes    Vitamin D  deficiency     Past Surgical History:  Procedure Laterality Date   CESAREAN SECTION N/A 11/20/2018   Procedure: CESAREAN SECTION;  Surgeon: Ana Balling, MD;  Location: [facility];  Service: Obstetrics;  Laterality: N/A;   CESAREAN SECTION N/A 09/17/2021   Procedure: CESAREAN SECTION;  Surgeon: Malka Sea, DO;  Location: [facility];  Service: Obstetrics;  Laterality: N/A;   TYMPANOSTOMY TUBE PLACEMENT      Family History  Problem Relation Age of  Onset   Healthy Mother    Anxiety disorder Mother    Bipolar disorder Father    Hypertension Maternal Grandmother    Hypertension Maternal Grandfather    Asthma Neg Hx    Cancer Neg Hx    Diabetes Neg Hx    Heart disease Neg Hx    Stroke Neg Hx     Social History   Socioeconomic History   Marital status: Single    Spouse name: Not on file   Number of children: Not on file   Years of education: 12th   Highest education level: 12th grade  Occupational History   Occupation: Investment banker, corporate   Occupation: Consulting civil engineer, work in nursing home  Tobacco Use   Smoking status: Never   Smokeless tobacco: Never  Vaping Use   Vaping status: Former   Quit date: 04/17/2018  Substance and Sexual Activity   Alcohol use: Not Currently   Drug use: Never   Sexual activity: Yes    Birth control/protection: None  Other Topics Concern   Not on file  Social History Narrative   Not on file   Social Drivers of Health   Financial Resource Strain: Low Risk  (05/04/2023)   Overall Financial Resource Strain (CARDIA)    Difficulty of Paying Living Expenses: Not very hard  Food Insecurity: No Food Insecurity (05/04/2023)   Hunger Vital Sign    Worried About Running Out of Food in the Last Year: Never true    Ran Out of Food in  the Last Year: Never true  Transportation Needs: No Transportation Needs (05/04/2023)   PRAPARE - Administrator, Civil Service (Medical): No    Lack of Transportation (Non-Medical): No  Physical Activity: Patient Declined (05/04/2023)   Exercise Vital Sign    Days of Exercise per Week: Patient declined    Minutes of Exercise per Session: Patient declined  Stress: Stress Concern Present (05/04/2023)   [employer] of Occupational Health - Occupational Stress Questionnaire    Feeling of Stress : To some extent  Social Connections: Socially Isolated (05/04/2023)   Social Connection and Isolation Panel [NHANES]    Frequency of Communication with Friends and  Family: Once a week    Frequency of Social Gatherings with Friends and Family: Once a week    Attends Religious Services: Never    Database administrator or Organizations: No    Attends Engineer, structural: Never    Marital Status: Living with partner    No Known Allergies  Outpatient Medications Prior to Visit  Medication Sig Dispense Refill   ergocalciferol  (DRISDOL ) 1.25 MG (50000 UT) capsule Take 1 capsule (50,000 Units total) by mouth once a week. 12 capsule 0   No facility-administered medications prior to visit.     ROS Review of Systems  Constitutional:  Positive for fatigue. Negative for activity change and appetite change.  HENT:  Negative for sinus pressure and sore throat.   Respiratory:  Negative for chest tightness, shortness of breath and wheezing.   Cardiovascular:  Negative for chest pain and palpitations.  Gastrointestinal:  Negative for abdominal distention, abdominal pain and constipation.  Genitourinary: Negative.   Musculoskeletal: Negative.   Psychiatric/Behavioral:  Negative for behavioral problems and dysphoric mood.     Objective:  BP 111/74   Pulse 71   Ht 5\' 5"  (1.651 m)   Wt 295 lb 12.8 oz (134.2 kg)   LMP 04/18/2023   SpO2 97%   BMI 49.22 kg/m      06/25/2023    9:22 AM 06/16/2023    7:00 AM 05/13/2023    8:00 AM  BP/Weight  Systolic BP 111 109 108  Diastolic BP 74 75 74  Wt. (Lbs) 295.8 290 300  BMI 49.22 kg/m2 48.26 kg/m2 49.92 kg/m2      Physical Exam Constitutional:      Appearance: She is well-developed.  Cardiovascular:     Rate and Rhythm: Normal rate.     Heart sounds: Normal heart sounds. No murmur heard. Pulmonary:     Effort: Pulmonary effort is normal.     Breath sounds: Normal breath sounds. No wheezing or rales.  Chest:     Chest wall: No tenderness.  Abdominal:     General: Bowel sounds are normal. There is no distension.     Palpations: Abdomen is soft. There is no mass.     Tenderness: There is no  abdominal tenderness.  Musculoskeletal:     Right lower leg: No edema.     Left lower leg: No edema.     Comments: Edema of entire left forearm and hand greater than right  Neurological:     Mental Status: She is alert and oriented to person, place, and time.  Psychiatric:        Mood and Affect: Mood normal.        Latest Ref Rng & Units 05/04/2023   10:02 AM 09/17/2021    2:40 PM 09/05/2021    7:58 PM  CMP  Glucose 70 - 99 mg/dL 97   89   BUN 6 - 20 mg/dL 9   8   Creatinine 1.61 - 1.00 mg/dL 0.96  0.45  4.09   Sodium 134 - 144 mmol/L 142   138   Potassium 3.5 - 5.2 mmol/L 4.1   4.0   Chloride 96 - 106 mmol/L 105   109   CO2 20 - 29 mmol/L 21   22   Calcium 8.7 - 10.2 mg/dL 9.6   8.4   Total Protein 6.0 - 8.5 g/dL 6.8   6.1   Total Bilirubin 0.0 - 1.2 mg/dL <8.1   0.2   Alkaline Phos 44 - 121 IU/L 81   118   AST 0 - 40 IU/L 34   19   ALT 0 - 32 IU/L 41   17     Lipid Panel     Component Value Date/Time   CHOL 226 (H) 05/04/2023 1002   TRIG 126 05/04/2023 1002   HDL 51 05/04/2023 1002   LDLCALC 152 (H) 05/04/2023 1002    CBC    Component Value Date/Time   WBC 4.8 05/04/2023 1002   WBC 9.8 09/18/2021 0517   RBC 4.65 05/04/2023 1002   RBC 3.26 (L) 09/18/2021 0517   HGB 12.4 05/04/2023 1002   HCT 39.3 05/04/2023 1002   PLT 294 05/04/2023 1002   MCV 85 05/04/2023 1002   MCH 26.7 05/04/2023 1002   MCH 24.2 (L) 09/18/2021 0517   MCHC 31.6 05/04/2023 1002   MCHC 31.7 09/18/2021 0517   RDW 14.5 05/04/2023 1002   LYMPHSABS 2.2 05/04/2023 1002   MONOABS 0.5 09/05/2021 1958   EOSABS 0.1 05/04/2023 1002   BASOSABS 0.0 05/04/2023 1002    Lab Results  Component Value Date   HGBA1C 6.0 (H) 05/04/2023   Lab Results  Component Value Date   TSH 5.620 (H) 05/04/2023        Assessment & Plan Elevated TSH Symptoms suggest hypothyroidism. Levothyroxine indicated if TSH elevated. - Order TSH test. - Prescribe levothyroxine if TSH is elevated. - Adjust  levothyroxine dose based on TSH levels.  Fatigue Chronic fatigue possibly linked to hypothyroidism, anemia, or vitamin deficiencies. -She does have underlying vitamin D  deficiency and is on replacement  Numbness in hands and feet -Diabetes mellitus has been ruled out Numbness with swelling and pain - will evaluate for vitamin B12 deficiency, autoimmune conditions, or gout. - Order vitamin B12 test. - Order blood work for autoimmune conditions and gout.  Prediabetes A1c of 6.0 confirms prediabetes. No diabetes symptoms.        No orders of the defined types were placed in this encounter.   Follow-up: Return in about 3 months (around 09/25/2023) for Chronic medical conditions.       Joaquin Mulberry, MD, FAAFP. [facility] [place], [place] [phone]   06/25/2023, 10:42 AM

## 2023-06-26 ENCOUNTER — Encounter: Payer: Self-pay | Admitting: Family Medicine

## 2023-06-30 ENCOUNTER — Encounter (INDEPENDENT_AMBULATORY_CARE_PROVIDER_SITE_OTHER): Payer: Self-pay | Admitting: Family Medicine

## 2023-06-30 ENCOUNTER — Ambulatory Visit (INDEPENDENT_AMBULATORY_CARE_PROVIDER_SITE_OTHER): Admitting: Family Medicine

## 2023-06-30 VITALS — BP 117/82 | HR 74 | Temp 98.5°F | Ht 65.0 in | Wt 290.0 lb

## 2023-06-30 DIAGNOSIS — R7303 Prediabetes: Secondary | ICD-10-CM

## 2023-06-30 DIAGNOSIS — E559 Vitamin D deficiency, unspecified: Secondary | ICD-10-CM | POA: Diagnosis not present

## 2023-06-30 DIAGNOSIS — R7989 Other specified abnormal findings of blood chemistry: Secondary | ICD-10-CM

## 2023-06-30 DIAGNOSIS — E785 Hyperlipidemia, unspecified: Secondary | ICD-10-CM

## 2023-06-30 DIAGNOSIS — E782 Mixed hyperlipidemia: Secondary | ICD-10-CM

## 2023-06-30 DIAGNOSIS — Z6841 Body Mass Index (BMI) 40.0 and over, adult: Secondary | ICD-10-CM | POA: Diagnosis not present

## 2023-06-30 MED ORDER — WEGOVY 0.25 MG/0.5ML ~~LOC~~ SOAJ: 0.2500 mg | SUBCUTANEOUS | 0 refills | Status: DC

## 2023-06-30 NOTE — Progress Notes (Signed)
 SUBJECTIVE:  Chief Complaint: Obesity  Interim History: She is not seeing much difference in herself since first appointment.  No hunger, no cravings.  For breakfast- 2 eggs, toast, sausage in the am.  Lunch is salad or sandwich depending on what is available and dinner is whatever is available at work. Snack calories are quest chips, fruit.  No upcoming plans for [facility] Day.    Neria is here to discuss her progress with her obesity treatment plan. She is on the Category 4 Plan and states she is following her eating plan approximately 70 % of the time. She states she is exercising 60 minutes 4 times per week.   OBJECTIVE: Visit Diagnoses: Problem List Items Addressed This Visit       Other   Morbid obesity (HCC)   Anthropometric Measurements Height: 5\' 5"  (1.651 m) Weight: 290 lb (131.5 kg) BMI (Calculated): 48.26 Weight at Last Visit: 290 lb Weight Lost Since Last Visit: 0 Weight Gained Since Last Visit: 0 Starting Weight: 29 lb Total Weight Loss (lbs): 0 lb (0 kg) Peak Weight: 300 lb Body Composition  Body Fat %: 47.1 % Fat Mass (lbs): 136.6 lbs Muscle Mass (lbs): 145.6 lbs Total Body Water (lbs): 102.6 lbs Visceral Fat Rating : 14 Other Clinical Data RMR: 2261 Fasting: no Labs: no Today's Visit #: 2 Starting Date: 06/16/22       Relevant Medications   Semaglutide -Weight Management (WEGOVY ) 0.25 MG/0.5ML SOAJ   Vitamin D  deficiency   Discussed importance of vitamin d  supplementation.  Vitamin d  supplementation has been shown to decrease fatigue, decrease risk of progression to insulin  resistance and then prediabetes, decreases risk of falling in older age and can even assist in decreasing depressive symptoms in PTSD.   Prescription for Vitamin D  sent in.        Abnormal TSH   Previously elevated TSH.  TSH done recently and was back WNL.  No cold intolerance, heat intolerance or palpitations.  No medication needed.      Prediabetes - Primary    Pathophysiology of progression through insulin  resistance to prediabetes and diabetes was discussed at length today.  Patient to continue to monitor and be in control of total intake of snack calories which may be simple carbohydrates but should be consumed only after the patient has taken in all the nutrition for the day.  Macronutrient identification, classification and daily intake ratios were discussed.  Plan to repeat labs in 3 months to monitor both hemoglobin A1c and insulin  levels.  No medications at this time as patient is not having significant hunger or cravings that would make following meal plan more difficult.         Hyperlipidemia   HDL and Triglycerides at goal.  LDL elevated on labs done at initial appointment.  Discussed limiting saturated fat intake to 20% or less of total intake.  Will repeat labs in 3 months.  LDL not elevated enough to discuss medication treatment at this time      Other Visit Diagnoses       BMI 45.0-49.9, adult (HCC)       Relevant Medications   Semaglutide -Weight Management (WEGOVY ) 0.25 MG/0.5ML SOAJ       No data recorded       06/30/2023   10:00 AM 06/25/2023    9:22 AM 06/16/2023    7:00 AM  Vitals with BMI  Height 5\' 5"  5\' 5"  5\' 5"   Weight 290 lbs 295 lbs 13 oz 290 lbs  BMI 48.26 49.22 48.26  Systolic 117 111 242  Diastolic 82 74 75  Pulse 74 71 77      ASSESSMENT AND PLAN:  Diet: George is currently in the action stage of change. As such, her goal is to continue with weight loss efforts and has agreed to the Category 4 Plan.   Exercise:  For substantial health benefits, adults should do at least 150 minutes (2 hours and 30 minutes) a week of moderate-intensity, or 75 minutes (1 hour and 15 minutes) a week of vigorous-intensity aerobic physical activity, or an equivalent combination of moderate- and vigorous-intensity aerobic activity. Aerobic activity should be performed in episodes of at least 10 minutes, and preferably, it  should be spread throughout the week.  Behavior Modification:  We discussed the following Behavioral Modification Strategies today: increasing lean protein intake, decreasing simple carbohydrates, increasing vegetables, meal planning and cooking strategies, and keeping healthy foods in the home. We discussed various medication options to help [facility] with her weight loss efforts and we both agreed to start Wegovy  at 0.25mg  weekly.  Return in about 3 weeks (around 07/21/2023).   She was informed of the importance of frequent follow up visits to maximize her success with intensive lifestyle modifications for her multiple health conditions.  Attestation Statements:   Reviewed by clinician on day of visit: allergies, medications, problem list, medical history, surgical history, family history, social history, and previous encounter notes.    Donaciano Frizzle, MD

## 2023-07-01 ENCOUNTER — Ambulatory Visit: Payer: Self-pay | Admitting: Family Medicine

## 2023-07-01 DIAGNOSIS — R768 Other specified abnormal immunological findings in serum: Secondary | ICD-10-CM

## 2023-07-01 LAB — ANTI-SMITH ANTIBODY: ENA SM Ab Ser-aCnc: <0.2 AI (ref 0.0–0.9)

## 2023-07-01 LAB — TSH: TSH: 3.34 u[IU]/mL (ref 0.450–4.500)

## 2023-07-01 LAB — ANA,IFA RA DIAG PNL W/RFLX TIT/PATN
Cyclic Citrullin Peptide Ab: 10 U (ref 0–19)
Cyclic Citrullin Peptide Ab: 10 U — AB (ref 0–19)
Rheumatoid fact SerPl-aCnc: <10 [IU]/mL (ref <14.0)

## 2023-07-01 LAB — C-REACTIVE PROTEIN: CRP: 5 mg/L (ref 0–10)

## 2023-07-01 LAB — VITAMIN B12: Vitamin B-12: 1206 pg/mL (ref 232–1245)

## 2023-07-01 LAB — URIC ACID: Uric Acid: 5.5 mg/dL (ref 2.6–6.2)

## 2023-07-01 LAB — SEDIMENTATION RATE: Sed Rate: 21 mm/h (ref 0–32)

## 2023-07-01 LAB — FANA STAINING PATTERNS: Speckled Pattern: 1:160 {titer} — ABNORMAL HIGH

## 2023-07-01 LAB — T4, FREE: Free T4: 0.92 ng/dL (ref 0.82–1.77)

## 2023-07-01 LAB — T3: T3, Total: 137 ng/dL (ref 71–180)

## 2023-07-01 LAB — ANTI-DNA ANTIBODY, DOUBLE-STRANDED: dsDNA Ab: 2 [IU]/mL (ref 0–9)

## 2023-07-01 NOTE — Telephone Encounter (Signed)
 PA started 06/30/23

## 2023-07-07 NOTE — Telephone Encounter (Signed)
 Waiting for response for PA

## 2023-07-10 ENCOUNTER — Encounter: Payer: Self-pay | Admitting: Family Medicine

## 2023-07-10 ENCOUNTER — Other Ambulatory Visit: Payer: Self-pay | Admitting: Family Medicine

## 2023-07-10 MED ORDER — MELOXICAM 7.5 MG PO TABS: 7.5000 mg | ORAL_TABLET | Freq: Every day | ORAL | 1 refills | Status: AC

## 2023-07-11 NOTE — Assessment & Plan Note (Signed)
 Discussed importance of vitamin d supplementation.  Vitamin d supplementation has been shown to decrease fatigue, decrease risk of progression to insulin resistance and then prediabetes, decreases risk of falling in older age and can even assist in decreasing depressive symptoms in PTSD.   Prescription for Vitamin D sent in.

## 2023-07-11 NOTE — Assessment & Plan Note (Signed)
 Anthropometric Measurements Height: 5\' 5"  (1.651 m) Weight: 290 lb (131.5 kg) BMI (Calculated): 48.26 Weight at Last Visit: 290 lb Weight Lost Since Last Visit: 0 Weight Gained Since Last Visit: 0 Starting Weight: 29 lb Total Weight Loss (lbs): 0 lb (0 kg) Peak Weight: 300 lb Body Composition  Body Fat %: 47.1 % Fat Mass (lbs): 136.6 lbs Muscle Mass (lbs): 145.6 lbs Total Body Water (lbs): 102.6 lbs Visceral Fat Rating : 14 Other Clinical Data RMR: 2261 Fasting: no Labs: no Today's Visit #: 2 Starting Date: 06/16/22

## 2023-07-11 NOTE — Assessment & Plan Note (Signed)

## 2023-07-11 NOTE — Assessment & Plan Note (Signed)
 Previously elevated TSH.  TSH done recently and was back WNL.  No cold intolerance, heat intolerance or palpitations.  No medication needed.

## 2023-07-11 NOTE — Assessment & Plan Note (Signed)
 HDL and Triglycerides at goal.  LDL elevated on labs done at initial appointment.  Discussed limiting saturated fat intake to 20% or less of total intake.  Will repeat labs in 3 months.  LDL not elevated enough to discuss medication treatment at this time

## 2023-07-18 ENCOUNTER — Encounter (HOSPITAL_COMMUNITY): Payer: Self-pay | Admitting: Emergency Medicine

## 2023-07-18 ENCOUNTER — Ambulatory Visit (HOSPITAL_COMMUNITY)
Admission: EM | Admit: 2023-07-18 | Discharge: 2023-07-19 | Disposition: A | Discharge status: Home / Self Care | Attending: Emergency Medicine | Admitting: Emergency Medicine

## 2023-07-18 DIAGNOSIS — N3000 Acute cystitis without hematuria: Secondary | ICD-10-CM | POA: Diagnosis not present

## 2023-07-18 DIAGNOSIS — K3580 Unspecified acute appendicitis: Secondary | ICD-10-CM | POA: Diagnosis not present

## 2023-07-18 DIAGNOSIS — R1031 Right lower quadrant pain: Secondary | ICD-10-CM | POA: Diagnosis not present

## 2023-07-18 LAB — URINALYSIS, ROUTINE W REFLEX MICROSCOPIC
Bilirubin Urine: NEGATIVE
Glucose, UA: NEGATIVE mg/dL
Hgb urine dipstick: NEGATIVE
Ketones, ur: NEGATIVE mg/dL
Leukocytes,Ua: NEGATIVE
Nitrite: NEGATIVE
Protein, ur: NEGATIVE mg/dL
Specific Gravity, Urine: 1.026 (ref 1.005–1.030)
pH: 5 (ref 5.0–8.0)

## 2023-07-18 LAB — CBC WITH DIFFERENTIAL/PLATELET
Abs Immature Granulocytes: 0.04 10*3/uL (ref 0.00–0.07)
Basophils Absolute: 0 10*3/uL (ref 0.0–0.1)
Basophils Relative: 0 %
Eosinophils Absolute: 0 10*3/uL (ref 0.0–0.5)
Eosinophils Relative: 0 %
HCT: 40.7 % (ref 36.0–46.0)
Hemoglobin: 12.5 g/dL (ref 12.0–15.0)
Immature Granulocytes: 0 %
Lymphocytes Relative: 12 %
Lymphs Abs: 1.4 10*3/uL (ref 0.7–4.0)
MCH: 26.8 pg (ref 26.0–34.0)
MCHC: 30.7 g/dL (ref 30.0–36.0)
MCV: 87.2 fL (ref 80.0–100.0)
Monocytes Absolute: 0.6 10*3/uL (ref 0.1–1.0)
Monocytes Relative: 5 %
Neutro Abs: 9.9 10*3/uL — ABNORMAL HIGH (ref 1.7–7.7)
Neutrophils Relative %: 83 %
Platelets: 265 10*3/uL (ref 150–400)
RBC: 4.67 MIL/uL (ref 3.87–5.11)
RDW: 13.4 % (ref 11.5–15.5)
WBC: 11.9 10*3/uL — ABNORMAL HIGH (ref 4.0–10.5)
nRBC: 0 % (ref 0.0–0.2)

## 2023-07-18 MED ORDER — SODIUM CHLORIDE 0.9 % IV BOLUS
500.0000 mL | Freq: Once | INTRAVENOUS | Status: AC
  Administered 2023-07-19: 500 mL via INTRAVENOUS

## 2023-07-18 MED ORDER — FENTANYL CITRATE PF 50 MCG/ML IJ SOSY
50.0000 ug | PREFILLED_SYRINGE | Freq: Once | INTRAMUSCULAR | Status: AC
  Administered 2023-07-19: 50 ug via INTRAVENOUS
  Filled 2023-07-18: qty 1

## 2023-07-18 MED ORDER — ONDANSETRON HCL 4 MG/2ML IJ SOLN
4.0000 mg | Freq: Once | INTRAMUSCULAR | Status: AC
  Administered 2023-07-19: 4 mg via INTRAVENOUS
  Filled 2023-07-18: qty 2

## 2023-07-18 NOTE — ED Triage Notes (Signed)
 RLQ pain starting at 7pm constant and 2 episodes of vomiting. Denies fever. No abdominal surgeries.

## 2023-07-19 ENCOUNTER — Emergency Department (HOSPITAL_COMMUNITY): Admitting: Certified Registered Nurse Anesthetist

## 2023-07-19 ENCOUNTER — Encounter (HOSPITAL_COMMUNITY)
Admission: EM | Disposition: A | Discharge status: Home / Self Care | Payer: Self-pay | Source: Home / Self Care | Attending: Emergency Medicine

## 2023-07-19 ENCOUNTER — Emergency Department (HOSPITAL_COMMUNITY)

## 2023-07-19 ENCOUNTER — Other Ambulatory Visit: Payer: Self-pay

## 2023-07-19 DIAGNOSIS — K3589 Other acute appendicitis without perforation or gangrene: Secondary | ICD-10-CM | POA: Diagnosis not present

## 2023-07-19 DIAGNOSIS — K3580 Unspecified acute appendicitis: Secondary | ICD-10-CM

## 2023-07-19 DIAGNOSIS — Z6841 Body Mass Index (BMI) 40.0 and over, adult: Secondary | ICD-10-CM

## 2023-07-19 DIAGNOSIS — E785 Hyperlipidemia, unspecified: Secondary | ICD-10-CM

## 2023-07-19 DIAGNOSIS — R112 Nausea with vomiting, unspecified: Secondary | ICD-10-CM | POA: Diagnosis not present

## 2023-07-19 DIAGNOSIS — R1031 Right lower quadrant pain: Secondary | ICD-10-CM | POA: Diagnosis not present

## 2023-07-19 HISTORY — PX: LAPAROSCOPIC APPENDECTOMY: SHX408

## 2023-07-19 LAB — COMPREHENSIVE METABOLIC PANEL WITH GFR
ALT: 30 U/L (ref 0–44)
AST: 24 U/L (ref 15–41)
Albumin: 4 g/dL (ref 3.5–5.0)
Alkaline Phosphatase: 74 U/L (ref 38–126)
Anion gap: 8 (ref 5–15)
BUN: 14 mg/dL (ref 6–20)
CO2: 24 mmol/L (ref 22–32)
Calcium: 8.6 mg/dL — ABNORMAL LOW (ref 8.9–10.3)
Chloride: 105 mmol/L (ref 98–111)
Creatinine, Ser: 0.66 mg/dL (ref 0.44–1.00)
GFR, Estimated: >60 mL/min (ref >60)
Glucose, Bld: 120 mg/dL — ABNORMAL HIGH (ref 70–99)
Potassium: 3.8 mmol/L (ref 3.5–5.1)
Sodium: 137 mmol/L (ref 135–145)
Total Bilirubin: 0.8 mg/dL (ref 0.0–1.2)
Total Protein: 7.5 g/dL (ref 6.5–8.1)

## 2023-07-19 LAB — HCG, SERUM, QUALITATIVE: Preg, Serum: NEGATIVE

## 2023-07-19 SURGERY — APPENDECTOMY, LAPAROSCOPIC
Anesthesia: General | Site: Abdomen

## 2023-07-19 MED ORDER — SODIUM CHLORIDE 0.9 % IV SOLN: INTRAVENOUS | Status: DC

## 2023-07-19 MED ORDER — FENTANYL CITRATE (PF) 100 MCG/2ML IJ SOLN
INTRAMUSCULAR | Status: AC
  Filled 2023-07-19: qty 2

## 2023-07-19 MED ORDER — PROPOFOL 10 MG/ML IV BOLUS
INTRAVENOUS | Status: AC
  Filled 2023-07-19: qty 20

## 2023-07-19 MED ORDER — KETOROLAC TROMETHAMINE 30 MG/ML IJ SOLN
INTRAMUSCULAR | Status: AC
  Filled 2023-07-19: qty 1

## 2023-07-19 MED ORDER — ACETAMINOPHEN 10 MG/ML IV SOLN
INTRAVENOUS | Status: DC | PRN
Start: 2023-07-19 — End: 2023-07-19
  Administered 2023-07-19: 1000 mg via INTRAVENOUS

## 2023-07-19 MED ORDER — MEPERIDINE HCL 50 MG/ML IJ SOLN: 6.2500 mg | INTRAMUSCULAR | Status: DC | PRN

## 2023-07-19 MED ORDER — CEPHALEXIN 500 MG PO CAPS: 500.0000 mg | ORAL_CAPSULE | Freq: Four times a day (QID) | ORAL | 0 refills | Status: AC

## 2023-07-19 MED ORDER — DEXAMETHASONE SODIUM PHOSPHATE 10 MG/ML IJ SOLN
INTRAMUSCULAR | Status: DC | PRN
  Administered 2023-07-19: 10 mg via INTRAVENOUS

## 2023-07-19 MED ORDER — ROCURONIUM BROMIDE 10 MG/ML (PF) SYRINGE
PREFILLED_SYRINGE | INTRAVENOUS | Status: DC | PRN
  Administered 2023-07-19: 60 mg via INTRAVENOUS

## 2023-07-19 MED ORDER — CARMEX CLASSIC LIP BALM EX OINT
TOPICAL_OINTMENT | CUTANEOUS | Status: AC
  Filled 2023-07-19: qty 10

## 2023-07-19 MED ORDER — OXYCODONE HCL 5 MG/5ML PO SOLN: 5.0000 mg | Freq: Once | ORAL | Status: AC | PRN

## 2023-07-19 MED ORDER — BUPIVACAINE-EPINEPHRINE 0.25% -1:200000 IJ SOLN
INTRAMUSCULAR | Status: DC | PRN
  Administered 2023-07-19: 25 mL

## 2023-07-19 MED ORDER — ACETAMINOPHEN 10 MG/ML IV SOLN
INTRAVENOUS | Status: AC
  Filled 2023-07-19: qty 100

## 2023-07-19 MED ORDER — LACTATED RINGERS IV SOLN: INTRAVENOUS | Status: DC | PRN

## 2023-07-19 MED ORDER — HYDROMORPHONE HCL 1 MG/ML IJ SOLN
INTRAMUSCULAR | Status: AC
  Filled 2023-07-19: qty 1

## 2023-07-19 MED ORDER — OXYCODONE HCL 5 MG PO TABS
5.0000 mg | ORAL_TABLET | Freq: Once | ORAL | Status: AC | PRN
  Administered 2023-07-19: 5 mg via ORAL

## 2023-07-19 MED ORDER — SUGAMMADEX SODIUM 200 MG/2ML IV SOLN
INTRAVENOUS | Status: DC | PRN
Start: 2023-07-19 — End: 2023-07-19
  Administered 2023-07-19: 300 mg via INTRAVENOUS

## 2023-07-19 MED ORDER — BUPIVACAINE-EPINEPHRINE (PF) 0.25% -1:200000 IJ SOLN
INTRAMUSCULAR | Status: AC
  Filled 2023-07-19: qty 30

## 2023-07-19 MED ORDER — SUCCINYLCHOLINE CHLORIDE 200 MG/10ML IV SOSY
PREFILLED_SYRINGE | INTRAVENOUS | Status: DC | PRN
  Administered 2023-07-19: 160 mg via INTRAVENOUS

## 2023-07-19 MED ORDER — SCOPOLAMINE 1 MG/3DAYS TD PT72
MEDICATED_PATCH | TRANSDERMAL | Status: AC
  Filled 2023-07-19: qty 1

## 2023-07-19 MED ORDER — OXYCODONE HCL 5 MG PO TABS
ORAL_TABLET | ORAL | Status: AC
  Filled 2023-07-19: qty 1

## 2023-07-19 MED ORDER — SUCCINYLCHOLINE CHLORIDE 200 MG/10ML IV SOSY
PREFILLED_SYRINGE | INTRAVENOUS | Status: AC
  Filled 2023-07-19: qty 10

## 2023-07-19 MED ORDER — MIDAZOLAM HCL 2 MG/2ML IJ SOLN: 0.5000 mg | Freq: Once | INTRAMUSCULAR | Status: DC | PRN

## 2023-07-19 MED ORDER — OXYCODONE-ACETAMINOPHEN 5-325 MG PO TABS: 1.0000 | ORAL_TABLET | ORAL | 0 refills | Status: DC | PRN

## 2023-07-19 MED ORDER — KETOROLAC TROMETHAMINE 30 MG/ML IJ SOLN
INTRAMUSCULAR | Status: DC | PRN
Start: 2023-07-19 — End: 2023-07-19
  Administered 2023-07-19: 30 mg via INTRAVENOUS

## 2023-07-19 MED ORDER — KETOROLAC TROMETHAMINE 30 MG/ML IJ SOLN
30.0000 mg | Freq: Once | INTRAMUSCULAR | Status: AC
  Administered 2023-07-19: 30 mg via INTRAVENOUS
  Filled 2023-07-19: qty 1

## 2023-07-19 MED ORDER — PROPOFOL 10 MG/ML IV BOLUS
INTRAVENOUS | Status: DC | PRN
  Administered 2023-07-19: 200 mg via INTRAVENOUS

## 2023-07-19 MED ORDER — IOHEXOL 300 MG/ML  SOLN
100.0000 mL | Freq: Once | INTRAMUSCULAR | Status: AC | PRN
  Administered 2023-07-19: 100 mL via INTRAVENOUS

## 2023-07-19 MED ORDER — DEXAMETHASONE SODIUM PHOSPHATE 10 MG/ML IJ SOLN
INTRAMUSCULAR | Status: AC
  Filled 2023-07-19: qty 1

## 2023-07-19 MED ORDER — LIDOCAINE HCL (PF) 2 % IJ SOLN
INTRAMUSCULAR | Status: AC
  Filled 2023-07-19: qty 5

## 2023-07-19 MED ORDER — ONDANSETRON HCL 4 MG/2ML IJ SOLN
INTRAMUSCULAR | Status: AC
  Filled 2023-07-19: qty 2

## 2023-07-19 MED ORDER — HYDROMORPHONE HCL 1 MG/ML IJ SOLN
0.2500 mg | INTRAMUSCULAR | Status: DC | PRN
  Administered 2023-07-19 (×2): 0.5 mg via INTRAVENOUS

## 2023-07-19 MED ORDER — NAPROXEN 500 MG PO TABS
500.0000 mg | ORAL_TABLET | Freq: Two times a day (BID) | ORAL | 0 refills | Status: AC
Start: 2023-07-19 — End: ?

## 2023-07-19 MED ORDER — LACTATED RINGERS IR SOLN
Status: DC | PRN
  Administered 2023-07-19: 1000 mL

## 2023-07-19 MED ORDER — MIDAZOLAM HCL 2 MG/2ML IJ SOLN
INTRAMUSCULAR | Status: AC
  Filled 2023-07-19: qty 2

## 2023-07-19 MED ORDER — FENTANYL CITRATE (PF) 100 MCG/2ML IJ SOLN
INTRAMUSCULAR | Status: DC | PRN
  Administered 2023-07-19 (×4): 50 ug via INTRAVENOUS
  Administered 2023-07-19: 100 ug via INTRAVENOUS

## 2023-07-19 MED ORDER — ROCURONIUM BROMIDE 10 MG/ML (PF) SYRINGE
PREFILLED_SYRINGE | INTRAVENOUS | Status: AC
  Filled 2023-07-19: qty 10

## 2023-07-19 MED ORDER — LIDOCAINE HCL (PF) 2 % IJ SOLN
INTRAMUSCULAR | Status: DC | PRN
Start: 2023-07-19 — End: 2023-07-19
  Administered 2023-07-19: 20 mg via INTRADERMAL

## 2023-07-19 MED ORDER — SODIUM CHLORIDE 0.9 % IV SOLN
1.0000 g | Freq: Once | INTRAVENOUS | Status: AC
  Administered 2023-07-19: 1 g via INTRAVENOUS
  Filled 2023-07-19: qty 10

## 2023-07-19 MED ORDER — SCOPOLAMINE 1 MG/3DAYS TD PT72
MEDICATED_PATCH | TRANSDERMAL | Status: DC | PRN
  Administered 2023-07-19: 1 via TRANSDERMAL

## 2023-07-19 MED ORDER — ONDANSETRON HCL 4 MG/2ML IJ SOLN
INTRAMUSCULAR | Status: DC | PRN
Start: 2023-07-19 — End: 2023-07-19
  Administered 2023-07-19: 4 mg via INTRAVENOUS

## 2023-07-19 MED ORDER — FENTANYL CITRATE PF 50 MCG/ML IJ SOSY
50.0000 ug | PREFILLED_SYRINGE | Freq: Once | INTRAMUSCULAR | Status: AC
  Administered 2023-07-19: 50 ug via INTRAVENOUS
  Filled 2023-07-19: qty 1

## 2023-07-19 SURGICAL SUPPLY — 30 items
BAG COUNTER SPONGE SURGICOUNT (BAG) IMPLANT
CABLE HIGH FREQUENCY MONO STRZ (ELECTRODE) ×1 IMPLANT
CHLORAPREP W/TINT 26 (MISCELLANEOUS) ×1 IMPLANT
CLIP APPLIE 5 13 M/L LIGAMAX5 (MISCELLANEOUS) IMPLANT
CLIP APPLIE ROT 10 11.4 M/L (STAPLE) IMPLANT
CLIP LIGATING HEMO LOK XL GOLD (MISCELLANEOUS) ×1 IMPLANT
COVER TRANSDUCER ULTRASND (DRAPES) IMPLANT
DERMABOND ADVANCED .7 DNX12 (GAUZE/BANDAGES/DRESSINGS) ×1 IMPLANT
ELECT REM PT RETURN 15FT ADLT (MISCELLANEOUS) ×1 IMPLANT
ENDOLOOP SUT PDS II 0 18 (SUTURE) IMPLANT
GLOVE BIO SURGEON STRL SZ7.5 (GLOVE) ×1 IMPLANT
GOWN STRL REUS W/ TWL XL LVL3 (GOWN DISPOSABLE) ×1 IMPLANT
GRASPER SUT TROCAR 14GX15 (MISCELLANEOUS) ×1 IMPLANT
IRRIGATION SUCT STRKRFLW 2 WTP (MISCELLANEOUS) ×1 IMPLANT
KIT BASIN OR (CUSTOM PROCEDURE TRAY) ×1 IMPLANT
KIT TURNOVER KIT A (KITS) IMPLANT
NDL INSUFFLATION 14GA 120MM (NEEDLE) ×1 IMPLANT
NEEDLE INSUFFLATION 14GA 120MM (NEEDLE) ×1 IMPLANT
PENCIL SMOKE EVACUATOR (MISCELLANEOUS) IMPLANT
POUCH RETRIEVAL ECOSAC 10 (ENDOMECHANICALS) IMPLANT
SCISSORS LAP 5X35 DISP (ENDOMECHANICALS) ×1 IMPLANT
SET TUBE SMOKE EVAC HIGH FLOW (TUBING) ×1 IMPLANT
SLEEVE Z-THREAD 5X100MM (TROCAR) ×1 IMPLANT
SPIKE FLUID TRANSFER (MISCELLANEOUS) ×1 IMPLANT
SUT MNCRL AB 4-0 PS2 18 (SUTURE) ×1 IMPLANT
TOWEL OR 17X26 10 PK STRL BLUE (TOWEL DISPOSABLE) ×1 IMPLANT
TRAY FOLEY MTR SLVR 16FR STAT (SET/KITS/TRAYS/PACK) IMPLANT
TRAY LAPAROSCOPIC (CUSTOM PROCEDURE TRAY) ×1 IMPLANT
TROCAR 11X100 Z THREAD (TROCAR) ×1 IMPLANT
TROCAR Z-THREAD OPTICAL 5X100M (TROCAR) ×1 IMPLANT

## 2023-07-19 NOTE — Anesthesia Procedure Notes (Signed)
 Procedure Name: Intubation Date/Time: 07/19/2023 9:06 AM  Performed by: Rochell Chroman, CRNAPre-anesthesia Checklist: Patient identified, Emergency Drugs available, Suction available and Patient being monitored Patient Re-evaluated:Patient Re-evaluated prior to induction Oxygen Delivery Method: Circle system utilized Preoxygenation: Pre-oxygenation with 100% oxygen Induction Type: IV induction, Cricoid Pressure applied and Rapid sequence Laryngoscope Size: 2 and Miller Grade View: Grade I Tube type: Oral Tube size: 7.0 mm Number of attempts: 1 Airway Equipment and Method: Stylet Placement Confirmation: ETT inserted through vocal cords under direct vision, positive ETCO2 and breath sounds checked- equal and bilateral Secured at: 23 cm Tube secured with: Tape Dental Injury: Teeth and Oropharynx as per pre-operative assessment

## 2023-07-19 NOTE — Transfer of Care (Signed)
 Immediate Anesthesia Transfer of Care Note  Patient: Krystal Hudson  Procedure(s) Performed: APPENDECTOMY, LAPAROSCOPIC (Abdomen)  Patient Location: PACU  Anesthesia Type:General  Level of Consciousness: awake and patient cooperative  Airway & Oxygen Therapy: Patient Spontanous Breathing and Patient connected to face mask  Post-op Assessment: Report given to RN and Post -op Vital signs reviewed and stable  Post vital signs: Reviewed and stable  Last Vitals:  Vitals Value Taken Time  BP    Temp    Pulse 69 07/19/23 0951  Resp 27 07/19/23 0951  SpO2 100 % 07/19/23 0951  Vitals shown include unfiled device data.  Last Pain:  Vitals:   07/19/23 0830  TempSrc:   PainSc: 6          Complications: No notable events documented.

## 2023-07-19 NOTE — Anesthesia Postprocedure Evaluation (Signed)
 Anesthesia Post Note  Patient: Krystal Hudson  Procedure(s) Performed: APPENDECTOMY, LAPAROSCOPIC (Abdomen)     Patient location during evaluation: PACU Anesthesia Type: General Level of consciousness: awake and alert, patient cooperative and oriented Pain management: pain level controlled Vital Signs Assessment: post-procedure vital signs reviewed and stable Respiratory status: spontaneous breathing, nonlabored ventilation and respiratory function stable Cardiovascular status: blood pressure returned to baseline and stable Postop Assessment: no apparent nausea or vomiting and adequate PO intake Anesthetic complications: no   No notable events documented.  Last Vitals:  Vitals:   07/19/23 1045 07/19/23 1100  BP: 120/69 138/84  Pulse: 60 60  Resp: (!) 21 20  Temp:    SpO2: 99% 92%    Last Pain:  Vitals:   07/19/23 1100  TempSrc:   PainSc: 5                  Aedan Geimer,E. Heide Brossart

## 2023-07-19 NOTE — ED Provider Notes (Signed)
 [place] EMERGENCY DEPARTMENT AT [facility] Provider Note   CSN: [id number] Arrival date & time: 07/18/23  2254     History  Chief Complaint  Patient presents with   Abdominal Pain    Teodora Baumgarten is a 28 y.o. female.  The history is provided by the patient.  Abdominal Pain Pain location:  RLQ Pain radiates to:  Does not radiate Pain severity:  Severe Onset quality:  Gradual Duration:  5 hours Timing:  Constant Progression:  Unchanged Chronicity:  New Context: not trauma   Relieved by:  Nothing Worsened by:  Nothing Ineffective treatments:  None tried Associated symptoms: nausea and vomiting   Associated symptoms: no anorexia and no fever   Risk factors: no alcohol abuse and has not had multiple surgeries       Past Medical History:  Diagnosis Date   Anemia    Headache    Hepatic steatosis    High cholesterol    Hypertension    Morbid obesity (HCC)    Prediabetes    Vitamin D  deficiency      Home Medications Prior to Admission medications   Medication Sig Start Date End Date Taking? Authorizing Provider  cephALEXin (KEFLEX) 500 MG capsule Take 1 capsule (500 mg total) by mouth 4 (four) times daily. 07/19/23  Yes Keitra Carusone, MD  naproxen (NAPROSYN) 500 MG tablet Take 1 tablet (500 mg total) by mouth 2 (two) times daily with a meal. 07/19/23  Yes Britlee Skolnik, MD  ergocalciferol  (DRISDOL ) 1.25 MG (50000 UT) capsule Take 1 capsule (50,000 Units total) by mouth once a week. 05/05/23   Newlin, Enobong, MD  meloxicam  (MOBIC ) 7.5 MG tablet Take 1 tablet (7.5 mg total) by mouth daily. 07/10/23   Newlin, Enobong, MD  Semaglutide -Weight Management (WEGOVY ) 0.25 MG/0.5ML SOAJ Inject 0.25 mg into the skin once a week. 06/30/23   Jenean Minus, MD      Allergies    Patient has no known allergies.    Review of Systems   Review of Systems  Constitutional:  Negative for fever.  Gastrointestinal:  Positive for abdominal pain, nausea and vomiting.  Negative for anorexia.  All other systems reviewed and are negative.   Physical Exam Updated Vital Signs BP 126/66 (BP Location: Left Arm)   Pulse 77   Temp 98 F (36.7 C) (Oral)   Resp 16   Ht 5\' 5"  (1.651 m)   Wt 132 kg   LMP 05/24/2023   SpO2 97%   BMI 48.43 kg/m  Physical Exam Vitals and nursing note reviewed.  Constitutional:      General: She is not in acute distress.    Appearance: She is well-developed.  HENT:     Head: Normocephalic and atraumatic.  Eyes:     Pupils: Pupils are equal, round, and reactive to light.  Cardiovascular:     Rate and Rhythm: Normal rate and regular rhythm.     Pulses: Normal pulses.     Heart sounds: Normal heart sounds.  Pulmonary:     Effort: Pulmonary effort is normal. No respiratory distress.     Breath sounds: Normal breath sounds.  Abdominal:     General: Bowel sounds are normal. There is no distension.     Palpations: Abdomen is soft.     Tenderness: There is abdominal tenderness in the right lower quadrant. There is guarding and rebound.  Musculoskeletal:        General: Normal range of motion.  Cervical back: Neck supple.  Skin:    General: Skin is dry.     Capillary Refill: Capillary refill takes less than 2 seconds.     Findings: No erythema or rash.  Neurological:     General: No focal deficit present.     Deep Tendon Reflexes: Reflexes normal.  Psychiatric:        Mood and Affect: Mood normal.     ED Results / Procedures / Treatments   Labs (all labs ordered are listed, but only abnormal results are displayed) Results for orders placed or performed during the hospital encounter of 07/18/23  Urinalysis, Routine w reflex microscopic -Urine, Clean Catch   Collection Time: 07/18/23 11:14 PM  Result Value Ref Range   Color, Urine YELLOW YELLOW   APPearance HAZY (A) CLEAR   Specific Gravity, Urine 1.026 1.005 - 1.030   pH 5.0 5.0 - 8.0   Glucose, UA NEGATIVE NEGATIVE mg/dL   Hgb urine dipstick NEGATIVE  NEGATIVE   Bilirubin Urine NEGATIVE NEGATIVE   Ketones, ur NEGATIVE NEGATIVE mg/dL   Protein, ur NEGATIVE NEGATIVE mg/dL   Nitrite NEGATIVE NEGATIVE   Leukocytes,Ua NEGATIVE NEGATIVE  CBC with Differential/Platelet   Collection Time: 07/18/23 11:29 PM  Result Value Ref Range   WBC 11.9 (H) 4.0 - 10.5 K/uL   RBC 4.67 3.87 - 5.11 MIL/uL   Hemoglobin 12.5 12.0 - 15.0 g/dL   HCT 16.1 09.6 - 04.5 %   MCV 87.2 80.0 - 100.0 fL   MCH 26.8 26.0 - 34.0 pg   MCHC 30.7 30.0 - 36.0 g/dL   RDW 40.9 81.1 - 91.4 %   Platelets 265 150 - 400 K/uL   nRBC 0.0 0.0 - 0.2 %   Neutrophils Relative % 83 %   Neutro Abs 9.9 (H) 1.7 - 7.7 K/uL   Lymphocytes Relative 12 %   Lymphs Abs 1.4 0.7 - 4.0 K/uL   Monocytes Relative 5 %   Monocytes Absolute 0.6 0.1 - 1.0 K/uL   Eosinophils Relative 0 %   Eosinophils Absolute 0.0 0.0 - 0.5 K/uL   Basophils Relative 0 %   Basophils Absolute 0.0 0.0 - 0.1 K/uL   Immature Granulocytes 0 %   Abs Immature Granulocytes 0.04 0.00 - 0.07 K/uL  Comprehensive metabolic panel with GFR   Collection Time: 07/18/23 11:29 PM  Result Value Ref Range   Sodium 137 135 - 145 mmol/L   Potassium 3.8 3.5 - 5.1 mmol/L   Chloride 105 98 - 111 mmol/L   CO2 24 22 - 32 mmol/L   Glucose, Bld 120 (H) 70 - 99 mg/dL   BUN 14 6 - 20 mg/dL   Creatinine, Ser 7.82 0.44 - 1.00 mg/dL   Calcium 8.6 (L) 8.9 - 10.3 mg/dL   Total Protein 7.5 6.5 - 8.1 g/dL   Albumin 4.0 3.5 - 5.0 g/dL   AST 24 15 - 41 U/L   ALT 30 0 - 44 U/L   Alkaline Phosphatase 74 38 - 126 U/L   Total Bilirubin 0.8 0.0 - 1.2 mg/dL   GFR, Estimated >95 >62 mL/min   Anion gap 8 5 - 15  hCG, serum, qualitative   Collection Time: 07/18/23 11:29 PM  Result Value Ref Range   Preg, Serum NEGATIVE NEGATIVE   CT ABDOMEN PELVIS W CONTRAST Result Date: 07/19/2023 CLINICAL DATA:  Acute nonlocalized abdominal pain EXAM: CT ABDOMEN AND PELVIS WITH CONTRAST TECHNIQUE: Multidetector CT imaging of the abdomen and pelvis was performed  using  the standard protocol following bolus administration of intravenous contrast. RADIATION DOSE REDUCTION: This exam was performed according to the departmental dose-optimization program which includes automated exposure control, adjustment of the mA and/or kV according to patient size and/or use of iterative reconstruction technique. CONTRAST:  100mL OMNIPAQUE IOHEXOL 300 MG/ML  SOLN COMPARISON:  None Available. FINDINGS: Lower chest: No acute abnormality. Hepatobiliary: Moderate hepatic steatosis. No enhancing intrahepatic mass. No intra or extrahepatic biliary ductal dilation. Gallbladder unremarkable. Pancreas: Unremarkable Spleen: Unremarkable Adrenals/Urinary Tract: Adrenal glands are unremarkable. Kidneys are normal, without renal calculi, focal lesion, or hydronephrosis. Bladder is unremarkable. Stomach/Bowel: Stomach is within normal limits. Appendix is not clearly identified, however, no secondary signs of appendicitis within the right lower quadrant. No evidence of bowel wall thickening, distention, or inflammatory changes. Vascular/Lymphatic: No significant vascular findings are present. No enlarged abdominal or pelvic lymph nodes. Reproductive: Uterus and bilateral adnexa are unremarkable. Other: No abdominal wall hernia or abnormality. No abdominopelvic ascites. Musculoskeletal: No acute or significant osseous findings. IMPRESSION: 1. Moderate hepatic steatosis. Otherwise no acute intra-abdominal findings. Electronically Signed   By: Worthy Heads M.D.   On: 07/19/2023 01:32    Radiology CT ABDOMEN PELVIS W CONTRAST Result Date: 07/19/2023 CLINICAL DATA:  Acute nonlocalized abdominal pain EXAM: CT ABDOMEN AND PELVIS WITH CONTRAST TECHNIQUE: Multidetector CT imaging of the abdomen and pelvis was performed using the standard protocol following bolus administration of intravenous contrast. RADIATION DOSE REDUCTION: This exam was performed according to the departmental dose-optimization program  which includes automated exposure control, adjustment of the mA and/or kV according to patient size and/or use of iterative reconstruction technique. CONTRAST:  100mL OMNIPAQUE IOHEXOL 300 MG/ML  SOLN COMPARISON:  None Available. FINDINGS: Lower chest: No acute abnormality. Hepatobiliary: Moderate hepatic steatosis. No enhancing intrahepatic mass. No intra or extrahepatic biliary ductal dilation. Gallbladder unremarkable. Pancreas: Unremarkable Spleen: Unremarkable Adrenals/Urinary Tract: Adrenal glands are unremarkable. Kidneys are normal, without renal calculi, focal lesion, or hydronephrosis. Bladder is unremarkable. Stomach/Bowel: Stomach is within normal limits. Appendix is not clearly identified, however, no secondary signs of appendicitis within the right lower quadrant. No evidence of bowel wall thickening, distention, or inflammatory changes. Vascular/Lymphatic: No significant vascular findings are present. No enlarged abdominal or pelvic lymph nodes. Reproductive: Uterus and bilateral adnexa are unremarkable. Other: No abdominal wall hernia or abnormality. No abdominopelvic ascites. Musculoskeletal: No acute or significant osseous findings. IMPRESSION: 1. Moderate hepatic steatosis. Otherwise no acute intra-abdominal findings. Electronically Signed   By: Worthy Heads M.D.   On: 07/19/2023 01:32    Procedures Procedures    Medications Ordered in ED Medications  fentaNYL  (SUBLIMAZE ) injection 50 mcg (50 mcg Intravenous Given 07/19/23 0007)  ondansetron  (ZOFRAN ) injection 4 mg (4 mg Intravenous Given 07/19/23 0006)  sodium chloride  0.9 % bolus 500 mL (500 mLs Intravenous Bolus 07/19/23 0006)  iohexol (OMNIPAQUE) 300 MG/ML solution 100 mL (100 mLs Intravenous Contrast Given 07/19/23 0110)  cefTRIAXone (ROCEPHIN) 1 g in sodium chloride  0.9 % 100 mL IVPB (0 g Intravenous Stopped 07/19/23 0312)  ketorolac  (TORADOL ) 30 MG/ML injection 30 mg (30 mg Intravenous Given 07/19/23 0202)    ED Course/ Medical  Decision Making/ A&P                                 Medical Decision Making Patient with RUQ and RLQ pain since 7   Amount and/or Complexity of Data Reviewed External Data Reviewed: notes.    Details: Previous  notes reviewed  Labs: ordered.    Details: Urine is negative for UTI.  White count elevated 11.9, normal hemoglobin 12.5, normal platelets.  Pregnancy negative normal sodium 137,  Radiology: ordered and independent interpretation performed.    Details: No stones by me  Discussion of management or test interpretation with external provider(s): 3:53 AM case d/w Dr. Melton Squires, keep NPO and surgery will see in am    Risk Prescription drug management. Decision regarding hospitalization. Risk Details: Exam is worsening despite non visualization of the appendix on CT, surgery to see in am     Final Clinical Impression(s) / ED Diagnoses Final diagnoses:  Acute cystitis without hematuria   Awaiting surgery consult in the ED, signed out to AM MD pending disposition      Myishia Kasik, MD 07/19/23 0401

## 2023-07-19 NOTE — Op Note (Signed)
 07/19/2023  9:38 AM  PATIENT:  Krystal Hudson  28 y.o. female  PRE-OPERATIVE DIAGNOSIS:  ACUTE APPENDICITIS  POST-OPERATIVE DIAGNOSIS:  ACUTE APPENDICITIS, non perforated  PROCEDURE:  Procedure(s): APPENDECTOMY, LAPAROSCOPIC (N/A)  SURGEON:  Surgeons and Role:    Shela Derby, MD - Primary  ASSISTANTS: none   ANESTHESIA:   local and general  EBL:  10 mL   BLOOD ADMINISTERED:none  DRAINS: none   LOCAL MEDICATIONS USED:  BUPIVICAINE   SPECIMEN:  No Specimen  DISPOSITION OF SPECIMEN:  N/A  COUNTS:  YES  TOURNIQUET:  * No tourniquets in log *  DICTATION: .Dragon Dictation Complications: none  Counts: reported as correct x 2  Findings:  The patient had acutely inflamed appendix  Specimen: Appendix  Indications for procedure:  The patient is a 28 year old female with a history of periumbilical pain localized in the right lower quadrant patient had a CT scan which revealed signs consistent with acute appendicitis the patient back in for laparoscopic appendectomy.  Details of the procedure:The patient was taken back to the operating room. The patient was placed in supine position with bilateral SCDs in place. The patient was prepped and draped in the usual sterile fashion.  After appropriate anitbiotics were confirmed, a time-out was confirmed and all facts were verified.    A pneumoperitoneum of 14 mmHg was obtained via a Veress needle technique in the left lower quadrant quadrant.  A 10 mm trocar and 5 mm camera then placed intra-abdominally there is no injury to any intra-abdominal organs a 5 mm supraumbilical port was placed and direct visualization as was a 5 mm port in the suprapubic area.   The appendix was identified and seen to be non-perforated.  The appendix was cleaned down to the appendiceal base. The mesoappendix was then incised and the appendiceal artery was cauterized.  The the appendiceal base was clean.  A gold hemoclip was placed proximally x2 and  one distally and the appendix was transected between these 2. A retrieval bag was then placed into the abdomen and the specimen placed in the bag. The appendiceal stump was cauterized. We evacuate the fluid from the pelvis until the effluent was clear. The appendix and retrieval  bag was then retrieved via the supraumbilical port. #1 Vicryl was used to reapproximate the fascia at the left lower quadrant port site x1. The skin was reapproximated all port sites 4-0 Monocryl subcuticular fashion. The skin was dressed with Dermabond.  The patient was awakened from general anesthesia was taken to recovery room in stable condition.     PLAN OF CARE: Discharge to home after PACU  PATIENT DISPOSITION:  PACU - hemodynamically stable.   Delay start of Pharmacological VTE agent (>24hrs) due to surgical blood loss or risk of bleeding: not applicable

## 2023-07-19 NOTE — Anesthesia Preprocedure Evaluation (Addendum)
 Anesthesia Evaluation  Patient identified by MRN, date of birth, ID band Patient awake    Reviewed: Allergy & Precautions, NPO status , Patient's Chart, lab work & pertinent test results  History of Anesthesia Complications Negative for: history of anesthetic complications  Airway Mallampati: I  TM Distance: >3 FB Neck ROM: Full    Dental  (+) Dental Advisory Given, Teeth Intact   Pulmonary neg pulmonary ROS   breath sounds clear to auscultation       Cardiovascular negative cardio ROS  Rhythm:Regular Rate:Normal     Neuro/Psych  Headaches    GI/Hepatic Neg liver ROS,,,N/V with acute appy   Endo/Other    Class 4 obesityBMI 48  Renal/GU negative Renal ROS     Musculoskeletal   Abdominal   Peds  Hematology negative hematology ROS (+)   Anesthesia Other Findings   Reproductive/Obstetrics                             Anesthesia Physical Anesthesia Plan  ASA: 3  Anesthesia Plan: General   Post-op Pain Management: Ofirmev  IV (intra-op)*   Induction: Intravenous and Rapid sequence  PONV Risk Score and Plan: 3 and Ondansetron , Dexamethasone  and Scopolamine  patch - Pre-op  Airway Management Planned: Oral ETT  Additional Equipment:   Intra-op Plan:   Post-operative Plan: Extubation in OR  Informed Consent: I have reviewed the patients History and Physical, chart, labs and discussed the procedure including the risks, benefits and alternatives for the proposed anesthesia with the patient or authorized representative who has indicated his/her understanding and acceptance.     Dental advisory given  Plan Discussed with: CRNA and Surgeon  Anesthesia Plan Comments:         Anesthesia Quick Evaluation

## 2023-07-19 NOTE — H&P (Signed)
 Krystal Hudson is an 28 y.o. female.   Chief Complaint: Abdominal pain HPI: Patient is a 28 year old female comes in secondary to abdominal pain.  She states that the pain began yesterday at 7 PM.  She states it was located the right lower quadrant.  She states it did radiate up to the umbilicus.  She states that she had some nausea vomiting.  States that she has had no pain when she is laying down however when she is walking he does have more pain.  Patient underwent CT scan.  The appendix was not able to be found on CT scan.  Patient did have a slight leukocytosis.  Patient states this morning that her pain continues to be located to the right lower quadrant.  Patient does have no appetite currently  Patient has no previous abdominal surgeries.  Past Medical History:  Diagnosis Date   Anemia    Headache    Hepatic steatosis    High cholesterol    Hypertension    Morbid obesity (HCC)    Prediabetes    Vitamin D  deficiency     Past Surgical History:  Procedure Laterality Date   CESAREAN SECTION N/A 11/20/2018   Procedure: CESAREAN SECTION;  Surgeon: Ana Balling, MD;  Location: [facility] LD ORS;  Service: Obstetrics;  Laterality: N/A;   CESAREAN SECTION N/A 09/17/2021   Procedure: CESAREAN SECTION;  Surgeon: Malka Sea, DO;  Location: [facility] LD ORS;  Service: Obstetrics;  Laterality: N/A;   TYMPANOSTOMY TUBE PLACEMENT      Family History  Problem Relation Age of Onset   Healthy Mother    Anxiety disorder Mother    Bipolar disorder Father    Hypertension Maternal Grandmother    Hypertension Maternal Grandfather    Asthma Neg Hx    Cancer Neg Hx    Diabetes Neg Hx    Heart disease Neg Hx    Stroke Neg Hx    Social History:  reports that she has never smoked. She has never used smokeless tobacco. She reports that she does not currently use alcohol. She reports that she does not use drugs.  Allergies: No Known Allergies  (Not in a hospital admission)   Results for orders placed  or performed during the hospital encounter of 07/18/23 (from the past 48 hours)  Urinalysis, Routine w reflex microscopic -Urine, Clean Catch     Status: Abnormal   Collection Time: 07/18/23 11:14 PM  Result Value Ref Range   Color, Urine YELLOW YELLOW   APPearance HAZY (A) CLEAR   Specific Gravity, Urine 1.026 1.005 - 1.030   pH 5.0 5.0 - 8.0   Glucose, UA NEGATIVE NEGATIVE mg/dL   Hgb urine dipstick NEGATIVE NEGATIVE   Bilirubin Urine NEGATIVE NEGATIVE   Ketones, ur NEGATIVE NEGATIVE mg/dL   Protein, ur NEGATIVE NEGATIVE mg/dL   Nitrite NEGATIVE NEGATIVE   Leukocytes,Ua NEGATIVE NEGATIVE    Comment: Performed at [facility], [address] [place], [place] [address]  CBC with Differential/Platelet     Status: Abnormal   Collection Time: 07/18/23 11:29 PM  Result Value Ref Range   WBC 11.9 (H) 4.0 - 10.5 K/uL   RBC 4.67 3.87 - 5.11 MIL/uL   Hemoglobin 12.5 12.0 - 15.0 g/dL   HCT 78.2 95.6 - 21.3 %   MCV 87.2 80.0 - 100.0 fL   MCH 26.8 26.0 - 34.0 pg   MCHC 30.7 30.0 - 36.0 g/dL   RDW 08.6 57.8 - 46.9 %  Platelets 265 150 - 400 K/uL   nRBC 0.0 0.0 - 0.2 %   Neutrophils Relative % 83 %   Neutro Abs 9.9 (H) 1.7 - 7.7 K/uL   Lymphocytes Relative 12 %   Lymphs Abs 1.4 0.7 - 4.0 K/uL   Monocytes Relative 5 %   Monocytes Absolute 0.6 0.1 - 1.0 K/uL   Eosinophils Relative 0 %   Eosinophils Absolute 0.0 0.0 - 0.5 K/uL   Basophils Relative 0 %   Basophils Absolute 0.0 0.0 - 0.1 K/uL   Immature Granulocytes 0 %   Abs Immature Granulocytes 0.04 0.00 - 0.07 K/uL    Comment: Performed at [facility], [address] [place], [place] [address]  Comprehensive metabolic panel with GFR     Status: Abnormal   Collection Time: 07/18/23 11:29 PM  Result Value Ref Range   Sodium 137 135 - 145 mmol/L   Potassium 3.8 3.5 - 5.1 mmol/L   Chloride 105 98 - 111 mmol/L   CO2 24 22 - 32 mmol/L   Glucose, Bld 120 (H) 70 - 99 mg/dL    Comment: Glucose  reference range applies only to samples taken after fasting for at least 8 hours.   BUN 14 6 - 20 mg/dL   Creatinine, Ser 5.40 0.44 - 1.00 mg/dL   Calcium 8.6 (L) 8.9 - 10.3 mg/dL   Total Protein 7.5 6.5 - 8.1 g/dL   Albumin 4.0 3.5 - 5.0 g/dL   AST 24 15 - 41 U/L   ALT 30 0 - 44 U/L   Alkaline Phosphatase 74 38 - 126 U/L   Total Bilirubin 0.8 0.0 - 1.2 mg/dL   GFR, Estimated >98 >11 mL/min    Comment: (NOTE) Calculated using the CKD-EPI Creatinine Equation (2021)    Anion gap 8 5 - 15    Comment: Performed at [facility], [address], [place], [place] [address]  hCG, serum, qualitative     Status: None   Collection Time: 07/18/23 11:29 PM  Result Value Ref Range   Preg, Serum NEGATIVE NEGATIVE    Comment:        THE SENSITIVITY OF THIS METHODOLOGY IS >10 mIU/mL. Performed at [facility], [address] [place], [place] [address]    CT ABDOMEN PELVIS W CONTRAST Result Date: 07/19/2023 CLINICAL DATA:  Acute nonlocalized abdominal pain EXAM: CT ABDOMEN AND PELVIS WITH CONTRAST TECHNIQUE: Multidetector CT imaging of the abdomen and pelvis was performed using the standard protocol following bolus administration of intravenous contrast. RADIATION DOSE REDUCTION: This exam was performed according to the departmental dose-optimization program which includes automated exposure control, adjustment of the mA and/or kV according to patient size and/or use of iterative reconstruction technique. CONTRAST:  100mL OMNIPAQUE IOHEXOL 300 MG/ML  SOLN COMPARISON:  None Available. FINDINGS: Lower chest: No acute abnormality. Hepatobiliary: Moderate hepatic steatosis. No enhancing intrahepatic mass. No intra or extrahepatic biliary ductal dilation. Gallbladder unremarkable. Pancreas: Unremarkable Spleen: Unremarkable Adrenals/Urinary Tract: Adrenal glands are unremarkable. Kidneys are normal, without renal calculi, focal lesion, or hydronephrosis. Bladder is  unremarkable. Stomach/Bowel: Stomach is within normal limits. Appendix is not clearly identified, however, no secondary signs of appendicitis within the right lower quadrant. No evidence of bowel wall thickening, distention, or inflammatory changes. Vascular/Lymphatic: No significant vascular findings are present. No enlarged abdominal or pelvic lymph nodes. Reproductive: Uterus and bilateral adnexa are unremarkable. Other: No abdominal wall hernia or abnormality. No abdominopelvic ascites. Musculoskeletal: No acute or significant  osseous findings. IMPRESSION: 1. Moderate hepatic steatosis. Otherwise no acute intra-abdominal findings. Electronically Signed   By: Worthy Heads M.D.   On: 07/19/2023 01:32    Review of Systems  Constitutional:  Negative for chills and fever.  HENT:  Negative for ear discharge, hearing loss and sore throat.   Eyes:  Negative for discharge.  Respiratory:  Negative for cough and shortness of breath.   Cardiovascular:  Negative for chest pain and leg swelling.  Gastrointestinal:  Positive for abdominal pain, nausea and vomiting. Negative for constipation and diarrhea.  Musculoskeletal:  Negative for myalgias and neck pain.  Skin:  Negative for rash.  Allergic/Immunologic: Negative for environmental allergies.  Neurological:  Negative for dizziness and seizures.  Hematological:  Does not bruise/bleed easily.  Psychiatric/Behavioral:  Negative for suicidal ideas.   All other systems reviewed and are negative.   Blood pressure 132/87, pulse 87, temperature 98 F (36.7 C), temperature source Oral, resp. rate 15, height 5\' 5"  (1.651 m), weight 132 kg, last menstrual period 05/24/2023, SpO2 100%, unknown if currently breastfeeding. Physical Exam Constitutional:      Appearance: She is well-developed.     Comments: Conversant No acute distress  HENT:     Head: Normocephalic and atraumatic.  Eyes:     General: Lids are normal. No scleral icterus.    Pupils: Pupils  are equal, round, and reactive to light.     Comments: Pupils are equal round and reactive No lid lag Moist conjunctiva  Neck:     Thyroid : No thyromegaly.     Trachea: No tracheal tenderness.     Comments: No cervical lymphadenopathy Cardiovascular:     Rate and Rhythm: Normal rate and regular rhythm.     Heart sounds: No murmur heard. Pulmonary:     Effort: Pulmonary effort is normal.     Breath sounds: Normal breath sounds. No wheezing or rales.  Abdominal:     Tenderness: There is abdominal tenderness in the right lower quadrant. Positive signs include McBurney's sign.     Hernia: No hernia is present.  Musculoskeletal:     Cervical back: Normal range of motion and neck supple.  Skin:    General: Skin is warm.     Findings: No rash.     Nails: There is no clubbing.     Comments: Normal skin turgor  Neurological:     Mental Status: She is alert and oriented to person, place, and time.     Comments: Normal gait and station  Psychiatric:        Mood and Affect: Mood normal.        Thought Content: Thought content normal.        Judgment: Judgment normal.     Comments: Appropriate affect      Assessment/Plan 28 year old female with acute appendicitis.  1.  Will proceed to the operatingn room for a lap appendectomy. 2. I discussed with the patient the risks benefits of the procedure to include but not limited to: Infection, bleeding, damage to surrounding structures, possible ileus, possible postoperative infection. Patient voiced understanding and wishes to proceed.    Shela Derby, MD 07/19/2023, 7:26 AM

## 2023-07-20 ENCOUNTER — Encounter (HOSPITAL_COMMUNITY): Payer: Self-pay | Admitting: General Surgery

## 2023-07-21 ENCOUNTER — Encounter: Payer: Self-pay | Admitting: Family Medicine

## 2023-07-21 ENCOUNTER — Encounter (HOSPITAL_COMMUNITY): Payer: Self-pay

## 2023-07-21 ENCOUNTER — Emergency Department (HOSPITAL_COMMUNITY)
Admission: EM | Admit: 2023-07-21 | Discharge: 2023-07-21 | Discharge status: Against Medical Advice | Attending: Emergency Medicine | Admitting: Emergency Medicine

## 2023-07-21 DIAGNOSIS — M79605 Pain in left leg: Secondary | ICD-10-CM | POA: Diagnosis not present

## 2023-07-21 DIAGNOSIS — M79602 Pain in left arm: Secondary | ICD-10-CM | POA: Diagnosis not present

## 2023-07-21 DIAGNOSIS — I10 Essential (primary) hypertension: Secondary | ICD-10-CM | POA: Insufficient documentation

## 2023-07-21 DIAGNOSIS — M79604 Pain in right leg: Secondary | ICD-10-CM | POA: Diagnosis not present

## 2023-07-21 DIAGNOSIS — Z5329 Procedure and treatment not carried out because of patient's decision for other reasons: Secondary | ICD-10-CM | POA: Diagnosis not present

## 2023-07-21 DIAGNOSIS — D649 Anemia, unspecified: Secondary | ICD-10-CM | POA: Diagnosis not present

## 2023-07-21 DIAGNOSIS — M79601 Pain in right arm: Secondary | ICD-10-CM | POA: Insufficient documentation

## 2023-07-21 DIAGNOSIS — M79606 Pain in leg, unspecified: Secondary | ICD-10-CM

## 2023-07-21 DIAGNOSIS — E876 Hypokalemia: Secondary | ICD-10-CM | POA: Diagnosis not present

## 2023-07-21 LAB — URINALYSIS, ROUTINE W REFLEX MICROSCOPIC
Bilirubin Urine: NEGATIVE
Glucose, UA: NEGATIVE mg/dL
Hgb urine dipstick: NEGATIVE
Ketones, ur: NEGATIVE mg/dL
Leukocytes,Ua: NEGATIVE
Nitrite: NEGATIVE
Protein, ur: NEGATIVE mg/dL
Specific Gravity, Urine: 1.027 (ref 1.005–1.030)
pH: 5 (ref 5.0–8.0)

## 2023-07-21 LAB — COMPREHENSIVE METABOLIC PANEL WITH GFR
ALT: 26 U/L (ref 0–44)
AST: 28 U/L (ref 15–41)
Albumin: 3.8 g/dL (ref 3.5–5.0)
Alkaline Phosphatase: 61 U/L (ref 38–126)
Anion gap: 10 (ref 5–15)
BUN: 19 mg/dL (ref 6–20)
CO2: 26 mmol/L (ref 22–32)
Calcium: 8.6 mg/dL — ABNORMAL LOW (ref 8.9–10.3)
Chloride: 103 mmol/L (ref 98–111)
Creatinine, Ser: 0.71 mg/dL (ref 0.44–1.00)
GFR, Estimated: >60 mL/min (ref >60)
Glucose, Bld: 117 mg/dL — ABNORMAL HIGH (ref 70–99)
Potassium: 3.4 mmol/L — ABNORMAL LOW (ref 3.5–5.1)
Sodium: 139 mmol/L (ref 135–145)
Total Bilirubin: 0.4 mg/dL (ref 0.0–1.2)
Total Protein: 6.7 g/dL (ref 6.5–8.1)

## 2023-07-21 LAB — CBC WITH DIFFERENTIAL/PLATELET
Abs Immature Granulocytes: 0.05 10*3/uL (ref 0.00–0.07)
Basophils Absolute: 0 10*3/uL (ref 0.0–0.1)
Basophils Relative: 1 %
Eosinophils Absolute: 0 10*3/uL (ref 0.0–0.5)
Eosinophils Relative: 0 %
HCT: 38.1 % (ref 36.0–46.0)
Hemoglobin: 11.5 g/dL — ABNORMAL LOW (ref 12.0–15.0)
Immature Granulocytes: 1 %
Lymphocytes Relative: 41 %
Lymphs Abs: 3.4 10*3/uL (ref 0.7–4.0)
MCH: 26.9 pg (ref 26.0–34.0)
MCHC: 30.2 g/dL (ref 30.0–36.0)
MCV: 89 fL (ref 80.0–100.0)
Monocytes Absolute: 0.4 10*3/uL (ref 0.1–1.0)
Monocytes Relative: 5 %
Neutro Abs: 4.3 10*3/uL (ref 1.7–7.7)
Neutrophils Relative %: 52 %
Platelets: 260 10*3/uL (ref 150–400)
RBC: 4.28 MIL/uL (ref 3.87–5.11)
RDW: 13.6 % (ref 11.5–15.5)
WBC: 8.2 10*3/uL (ref 4.0–10.5)
nRBC: 0 % (ref 0.0–0.2)

## 2023-07-21 LAB — PREGNANCY, URINE: Preg Test, Ur: NEGATIVE

## 2023-07-21 LAB — SURGICAL PATHOLOGY

## 2023-07-21 NOTE — ED Triage Notes (Addendum)
 Pt arrived via POV, had appendix taken out on Sunday. After getting home started to experience bilateral arm and leg pain. Spoke to surg team today and told to return to ED for further testing.

## 2023-07-21 NOTE — ED Notes (Signed)
 Called Lab to add pregnancy urine.

## 2023-07-21 NOTE — ED Provider Notes (Incomplete)
 [place] EMERGENCY DEPARTMENT AT [facility] Provider Note   CSN: [id number] Arrival date & time: 07/21/23  1410     History {Add pertinent medical, surgical, social history, OB history to HPI:1} Chief Complaint  Patient presents with   Post-op Problem    Krystal Hudson is a 28 y.o. female.  HPI     28 year old female with a history of hypertension, hyperlipidemia, recent appendectomy June 1, presents with diffuse pain.   Past Medical History:  Diagnosis Date   Anemia    Headache    Hepatic steatosis    High cholesterol    Hypertension    Morbid obesity (HCC)    Prediabetes    Vitamin D  deficiency      Home Medications Prior to Admission medications   Medication Sig Start Date End Date Taking? Authorizing Provider  cephALEXin (KEFLEX) 500 MG capsule Take 1 capsule (500 mg total) by mouth 4 (four) times daily. 07/19/23   Palumbo, April, MD  ergocalciferol  (DRISDOL ) 1.25 MG (50000 UT) capsule Take 1 capsule (50,000 Units total) by mouth once a week. Patient not taking: Reported on 07/19/2023 05/05/23   Newlin, Enobong, MD  meloxicam  (MOBIC ) 7.5 MG tablet Take 1 tablet (7.5 mg total) by mouth daily. 07/10/23   Newlin, Enobong, MD  naproxen (NAPROSYN) 500 MG tablet Take 1 tablet (500 mg total) by mouth 2 (two) times daily with a meal. 07/19/23   Palumbo, April, MD  oxyCODONE -acetaminophen  (PERCOCET) 5-325 MG tablet Take 1 tablet by mouth every 4 (four) hours as needed for severe pain (pain score 7-10). 07/19/23 07/18/24  Shela Derby, MD  Semaglutide -Weight Management (WEGOVY ) 0.25 MG/0.5ML SOAJ Inject 0.25 mg into the skin once a week. Patient not taking: Reported on 07/19/2023 06/30/23   Jenean Minus, MD      Allergies    Patient has no known allergies.    Review of Systems   Review of Systems  Physical Exam Updated Vital Signs BP (!) 156/87 (BP Location: Right Arm)   Pulse 86   Temp 98.3 F (36.8 C) (Oral)   Resp 18   Ht 5\' 5"  (1.651 m)   Wt 132  kg   LMP 05/24/2023   SpO2 98%   BMI 48.43 kg/m  Physical Exam  ED Results / Procedures / Treatments   Labs (all labs ordered are listed, but only abnormal results are displayed) Labs Reviewed  CBC WITH DIFFERENTIAL/PLATELET - Abnormal; Notable for the following components:      Result Value   Hemoglobin 11.5 (*)    All other components within normal limits  COMPREHENSIVE METABOLIC PANEL WITH GFR - Abnormal; Notable for the following components:   Potassium 3.4 (*)    Glucose, Bld 117 (*)    Calcium 8.6 (*)    All other components within normal limits  URINALYSIS, ROUTINE W REFLEX MICROSCOPIC - Abnormal; Notable for the following components:   APPearance HAZY (*)    All other components within normal limits    EKG None  Radiology No results found.  Procedures Procedures  {Document cardiac monitor, telemetry assessment procedure when appropriate:1}  Medications Ordered in ED Medications - No data to display  ED Course/ Medical Decision Making/ A&P   {   Click here for ABCD2, HEART and other calculatorsREFRESH Note before signing :1}                              Medical Decision Making  ***  {  Document critical care time when appropriate:1} {Document review of labs and clinical decision tools ie heart score, Chads2Vasc2 etc:1}  {Document your independent review of radiology images, and any outside records:1} {Document your discussion with family members, caretakers, and with consultants:1} {Document social determinants of health affecting pt's care:1} {Document your decision making why or why not admission, treatments were needed:1} Final Clinical Impression(s) / ED Diagnoses Final diagnoses:  None    Rx / DC Orders ED Discharge Orders     None

## 2023-07-21 NOTE — ED Notes (Signed)
 Pt requested to go home. MD made aware

## 2023-07-21 NOTE — ED Provider Triage Note (Signed)
 Emergency Medicine Provider Triage Evaluation Note  Krystal Hudson , a 28 y.o. female  was evaluated in triage.  Pt complains of extremity pain.  Review of Systems  Positive: Extremity pain Negative: Fever, vomiting  Physical Exam  BP (!) 156/87 (BP Location: Right Arm)   Pulse 86   Temp 98.3 F (36.8 C) (Oral)   Resp 18   LMP 05/24/2023   SpO2 98%  Gen:   Awake, no distress   Resp:  Normal effort  MSK:   Moves extremities without difficulty  Other:    Medical Decision Making  Medically screening exam initiated at 2:48 PM.  Appropriate orders placed.  Krystal Hudson was informed that the remainder of the evaluation will be completed by another provider, this initial triage assessment does not replace that evaluation, and the importance of remaining in the ED until their evaluation is complete.  Had appy on Sunday (2 days ago). Has no abdominal pain but c/o pain and swelling into bilateral UE's and LE's since yesterday.   Krystal Second, PA-C 07/21/23 1449

## 2023-07-21 NOTE — ED Notes (Addendum)
 Pt requested to leave AMA. MD made aware. AMA forms signed.

## 2023-07-22 NOTE — ED Provider Notes (Signed)
 27yo female who presents with concern for bilateral upper and lower extremity pain after appendectomy 2 days ago.  No abdominal pain at time of triage evaluation. Reviewed labs show mild anemia, no leukocytosis, mild hypokalemia, no transaminitis, no urine blood or signs of infection.  She had mild hypertension at check in and vital signs were otherwise normal.  Unfortunately after waiting many hours to be seen she decided to leave AMA, she was told I would be seeing her next but left prior to my evaluation.    Scarlette Currier, MD 07/22/23 1134

## 2023-07-23 ENCOUNTER — Ambulatory Visit (INDEPENDENT_AMBULATORY_CARE_PROVIDER_SITE_OTHER): Admitting: Family Medicine

## 2023-07-23 ENCOUNTER — Encounter (INDEPENDENT_AMBULATORY_CARE_PROVIDER_SITE_OTHER): Payer: Self-pay | Admitting: Family Medicine

## 2023-07-23 VITALS — BP 113/73 | HR 73 | Temp 98.5°F | Ht 65.0 in | Wt 294.0 lb

## 2023-07-23 DIAGNOSIS — R7303 Prediabetes: Secondary | ICD-10-CM

## 2023-07-23 DIAGNOSIS — Z6841 Body Mass Index (BMI) 40.0 and over, adult: Secondary | ICD-10-CM

## 2023-07-23 NOTE — Progress Notes (Signed)
   SUBJECTIVE:  Chief Complaint: Obesity  Interim History: Since last appointment patient underwent laparascopic appendectomy and had repeat evaluation in the ED for arm and leg pain.  She voices some of her symptoms have resolved and others are improving.  She is still recovering from her surgery 5/31.  She mentions that she tried to increase her intake initially but then due to her abdominal pain was slowed down in terms of total intake.  We attempted to get Wegovy  approved at prior appointment however Medicaid has repeatedly not acknowledged the information and paperwork sent to them.  Krystal Hudson is here to discuss her progress with her obesity treatment plan. She is on the Category 4 Plan and states she is following her eating plan approximately 70 % of the time. She states she is exercising.  OBJECTIVE: Visit Diagnoses: Problem List Items Addressed This Visit       Other   Morbid obesity (HCC)   Anthropometric Measurements Height: 5' 5 (1.651 m) Weight: 294 lb (133.4 kg) BMI (Calculated): 48.92 Weight at Last Visit: 290 lb Weight Lost Since Last Visit: 0 Weight Gained Since Last Visit: 4 Starting Weight: 290 lb Total Weight Loss (lbs): 0 lb (0 kg) Body Composition  Body Fat %: 51.9 % Fat Mass (lbs): 152.8 lbs Muscle Mass (lbs): 134.6 lbs Total Body Water (lbs): 103.2 lbs Visceral Fat Rating : 16 Other Clinical Data Fasting: no Labs: no Today's Visit #: 3 Starting Date: 06/16/22 Comments: Cat 4       Prediabetes - Primary   Last A1c of 6.0.  Tried to get approval for Wegovy  but Medicaid denied.  Patient working on limiting simple carbohydrate intake.  Will need repeat labs in 4 months.      Other Visit Diagnoses       BMI 45.0-49.9, adult (HCC)           No data recorded       07/23/2023    9:00 AM 07/21/2023    7:30 PM 07/21/2023    5:39 PM  Vitals with BMI  Height 5' 5    Weight 294 lbs    BMI 48.92    Systolic 113 131 454  Diastolic 73 82 61  Pulse 73  72 84      ASSESSMENT AND PLAN:  Diet: Krystal Hudson is currently in the action stage of change. As such, her goal is to continue with weight loss efforts and has agreed to the Category 4 Plan.   Exercise:  All adults should avoid inactivity. Some activity is better than none, and adults who participate in any amount of physical activity, gain some health benefits.  Behavior Modification:  We discussed the following Behavioral Modification Strategies today: increasing lean protein intake, decreasing simple carbohydrates, increasing vegetables, meal planning and cooking strategies, and keeping healthy foods in the home.   Return in about 4 weeks (around 08/20/2023).   She was informed of the importance of frequent follow up visits to maximize her success with intensive lifestyle modifications for her multiple health conditions.  Attestation Statements:   Reviewed by clinician on day of visit: allergies, medications, problem list, medical history, surgical history, family history, social history, and previous encounter notes.     Donaciano Frizzle, MD

## 2023-07-23 NOTE — Assessment & Plan Note (Signed)
 Last A1c of 6.0.  Tried to get approval for Wegovy  but Medicaid denied.  Patient working on limiting simple carbohydrate intake.  Will need repeat labs in 4 months.

## 2023-08-02 NOTE — Assessment & Plan Note (Signed)
 Anthropometric Measurements Height: 5' 5 (1.651 m) Weight: 294 lb (133.4 kg) BMI (Calculated): 48.92 Weight at Last Visit: 290 lb Weight Lost Since Last Visit: 0 Weight Gained Since Last Visit: 4 Starting Weight: 290 lb Total Weight Loss (lbs): 0 lb (0 kg) Body Composition  Body Fat %: 51.9 % Fat Mass (lbs): 152.8 lbs Muscle Mass (lbs): 134.6 lbs Total Body Water (lbs): 103.2 lbs Visceral Fat Rating : 16 Other Clinical Data Fasting: no Labs: no Today's Visit #: 3 Starting Date: 06/16/22 Comments: Cat 4

## 2023-08-12 ENCOUNTER — Telehealth (INDEPENDENT_AMBULATORY_CARE_PROVIDER_SITE_OTHER): Payer: Self-pay

## 2023-08-12 ENCOUNTER — Encounter (INDEPENDENT_AMBULATORY_CARE_PROVIDER_SITE_OTHER): Payer: Self-pay | Admitting: Family Medicine

## 2023-08-12 ENCOUNTER — Ambulatory Visit (INDEPENDENT_AMBULATORY_CARE_PROVIDER_SITE_OTHER): Admitting: Family Medicine

## 2023-08-12 VITALS — BP 104/71 | HR 98 | Temp 98.0°F | Ht 65.0 in | Wt 291.0 lb

## 2023-08-12 DIAGNOSIS — E782 Mixed hyperlipidemia: Secondary | ICD-10-CM

## 2023-08-12 DIAGNOSIS — Z6841 Body Mass Index (BMI) 40.0 and over, adult: Secondary | ICD-10-CM

## 2023-08-12 NOTE — Telephone Encounter (Signed)
 Prior auth started for Wegovy  0.25 mg.  Awaiting response.

## 2023-08-12 NOTE — Progress Notes (Signed)
 SUBJECTIVE:  Chief Complaint: Obesity  Interim History: patient has started to go back to the gym and is trying to go back easy since she just underwent surgery.   She started back at work and is trying to not go overboard with activitie.  For July 4th she doesn't have much plans except a cookout with her kids. In terms of meal plan she has increased her total protein intake and she started doing more of her lunch prepping in erms of dinner and lunch.  She is able to get all the protein in even at dinner. For snacks she is doing Quest protein chips and quest granola bars.   Krystal Hudson is here to discuss her progress with her obesity treatment plan. She is on the Category 4 Plan and states she is following her eating plan approximately 90 % of the time. She states she is exercising at the gym for 60 minutes 2 times per week.   OBJECTIVE: Visit Diagnoses: Problem List Items Addressed This Visit       Other   Morbid obesity (HCC)   Relevant Medications   tirzepatide  (MOUNJARO ) 2.5 MG/0.5ML Pen   Other Relevant Orders   WEGOVY  0.25MG  PREFILLED PEN   Hyperlipidemia - Primary   Relevant Orders   WEGOVY  0.25MG  PREFILLED PEN   Other Visit Diagnoses       BMI 45.0-49.9, adult (HCC)       Relevant Medications   tirzepatide  (MOUNJARO ) 2.5 MG/0.5ML Pen       No data recorded No data recorded No data recorded No data recorded    08/12/2023    2:00 PM 07/23/2023    9:00 AM 07/21/2023    7:30 PM  Vitals with BMI  Height 5' 5 5' 5   Weight 291 lbs 294 lbs   BMI 48.43 48.92   Systolic 104 113 868  Diastolic 71 73 82  Pulse 98 73 72     ASSESSMENT AND PLAN: Assessment & Plan Mixed hyperlipidemia Labs done in March show significantly elevated LDL at 152.  HDL and Triglycerides were both within normal limits.  Patient has been working on limiting saturated fat intake to less than 20%of her daily intake.  Will need repeat labs in the next month. Morbid obesity (HCC)  BMI 45.0-49.9,  adult (HCC)    Diet: Zlata is currently in the action stage of change. As such, her goal is to continue with weight loss efforts and has agreed to the Category 4 Plan.   Exercise:  For substantial health benefits, adults should do at least 150 minutes (2 hours and 30 minutes) a week of moderate-intensity, or 75 minutes (1 hour and 15 minutes) a week of vigorous-intensity aerobic physical activity, or an equivalent combination of moderate- and vigorous-intensity aerobic activity. Aerobic activity should be performed in episodes of at least 10 minutes, and preferably, it should be spread throughout the week.  Behavior Modification:  We discussed the following Behavioral Modification Strategies today: increasing lean protein intake, decreasing simple carbohydrates, increasing vegetables, avoiding temptations, and planning for success. We discussed various medication options to help Erynne with her weight loss efforts and we both agreed to try a prior authorization for zepbound  but send in a prescription of wegovy  in case she cannot get the zepbound  (we discussed that medicaid does cover wegovy  but often will require inadequate loss to pay for zepbound ).  Return in about 3 weeks (around 09/02/2023).   She was informed of the importance of frequent follow up  visits to maximize her success with intensive lifestyle modifications for her multiple health conditions.  Attestation Statements:   Reviewed by clinician on day of visit: allergies, medications, problem list, medical history, surgical history, family history, social history, and previous encounter notes.     Adelita Cho, MD

## 2023-08-13 ENCOUNTER — Ambulatory Visit (INDEPENDENT_AMBULATORY_CARE_PROVIDER_SITE_OTHER): Admitting: Family Medicine

## 2023-08-19 ENCOUNTER — Encounter (INDEPENDENT_AMBULATORY_CARE_PROVIDER_SITE_OTHER): Payer: Self-pay

## 2023-08-19 NOTE — Telephone Encounter (Signed)
 Inquiring about PA Denial

## 2023-08-20 NOTE — Telephone Encounter (Signed)
 PA for Wegovy  0.25 has been denied. PA is now complete.      Message from Plan Request Reference Number: [id number]. WEGOVY  INJ 0.25MG  is denied for not meeting the prior authorization requirement(s). For further questions, call [employer] at [phone] for more information.

## 2023-08-20 NOTE — Telephone Encounter (Signed)
PA done for Zepbound.

## 2023-08-23 MED ORDER — TIRZEPATIDE 2.5 MG/0.5ML ~~LOC~~ SOAJ: 2.5000 mg | SUBCUTANEOUS | 0 refills | Status: DC

## 2023-08-23 NOTE — Assessment & Plan Note (Signed)
 Labs done in March show significantly elevated LDL at 152.  HDL and Triglycerides were both within normal limits.  Patient has been working on limiting saturated fat intake to less than 20%of her daily intake.  Will need repeat labs in the next month.

## 2023-08-24 ENCOUNTER — Telehealth (INDEPENDENT_AMBULATORY_CARE_PROVIDER_SITE_OTHER): Payer: Self-pay

## 2023-08-24 NOTE — Telephone Encounter (Signed)
 Zepbound  denied.  WE DENIED: Denied Denied Code 1 Code 2 Plan Service Description Dates Amount Zepbound  Inj 2.5/0.5 Medicaid 08/20/2023 We denied your request for:  Zepbound  Inj 2.5/0.5  Medical Necessity  (A) You have tried for at least three months, a preferred drug: The preferred drug: Wegovy . (B) You cannot use the preferred drug.

## 2023-08-26 ENCOUNTER — Ambulatory Visit: Admitting: Family Medicine

## 2023-08-26 ENCOUNTER — Encounter: Payer: Self-pay | Admitting: Family Medicine

## 2023-08-26 ENCOUNTER — Telehealth: Payer: Self-pay | Admitting: *Deleted

## 2023-08-26 VITALS — BP 126/80 | HR 78 | Temp 98.6°F | Ht 65.0 in | Wt 290.0 lb

## 2023-08-26 DIAGNOSIS — Z6841 Body Mass Index (BMI) 40.0 and over, adult: Secondary | ICD-10-CM

## 2023-08-26 DIAGNOSIS — E66813 Obesity, class 3: Secondary | ICD-10-CM

## 2023-08-26 DIAGNOSIS — D509 Iron deficiency anemia, unspecified: Secondary | ICD-10-CM

## 2023-08-26 DIAGNOSIS — R5383 Other fatigue: Secondary | ICD-10-CM | POA: Diagnosis not present

## 2023-08-26 DIAGNOSIS — R7303 Prediabetes: Secondary | ICD-10-CM | POA: Diagnosis not present

## 2023-08-26 DIAGNOSIS — E559 Vitamin D deficiency, unspecified: Secondary | ICD-10-CM | POA: Diagnosis not present

## 2023-08-26 MED ORDER — ERGOCALCIFEROL 1.25 MG (50000 UT) PO CAPS: 50000.0000 [IU] | ORAL_CAPSULE | ORAL | 0 refills | Status: DC

## 2023-08-26 MED ORDER — WEGOVY 0.25 MG/0.5ML ~~LOC~~ SOAJ: 0.2500 mg | SUBCUTANEOUS | 0 refills | Status: DC

## 2023-08-26 NOTE — Telephone Encounter (Signed)
 Wegovy  is approved.  Patient notified.  Message from plan: Request Reference Number: [id number]. WEGOVY  INJ 0.25MG  is approved through 02/26/2024. For further questions, call [employer] at [phone].SABRA Authorization Expiration Date: February 26, 2024.

## 2023-08-26 NOTE — Progress Notes (Signed)
 Office: [phone]  /  Fax: [phone]  WEIGHT SUMMARY AND BIOMETRICS  Starting Date: 06/16/23  Starting Weight: 290lb   Weight Lost Since Last Visit: 1lb   Vitals Temp: 98.6 F (37 C) BP: 126/80 Pulse Rate: 78 SpO2: 96 %   Body Composition  Body Fat %: 51.4 % Fat Mass (lbs): 149.2 lbs Muscle Mass (lbs): 134 lbs Total Body Water (lbs): 102.2 lbs Visceral Fat Rating : 15    HPI  Chief Complaint: OBESITY  Krystal Hudson is here to discuss her progress with her obesity treatment plan. She is on the the Category 4 Plan and states she is following her eating plan approximately 90 % of the time. She states she is exercising 60 minutes 3 times per week.  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 Body mass index is 48.26 kg/m.  General: She is overweight, cooperative, alert, well developed, and in no acute distress. PSYCH: Has normal mood, affect and thought process.   Lungs: Normal breathing effort, no conversational dyspnea.   ASSESSMENT AND PLAN  TREATMENT PLAN FOR OBESITY:  Recommended Dietary Goals  Krystal Hudson is currently in the action stage of change. As such, her goal is to continue weight management plan. She has agreed to the Category 4 Plan.  Behavioral  Intervention  We discussed the following Behavioral Modification Strategies today: increasing lean protein intake to established goals, increasing fiber rich foods, increasing water intake , work on meal planning and preparation, keeping healthy foods at home, work on managing stress, creating time for self-care and relaxation, avoiding temptations and identifying enticing environmental cues, continue to practice mindfulness when eating, and continue to work on maintaining a reduced calorie state, getting the recommended amount of protein, incorporating whole foods, making healthy choices, staying well hydrated and practicing mindfulness when eating..  Additional resources provided today: NA  Recommended Physical Activity Goals  [name] has been advised to work up to 150 minutes of moderate intensity aerobic activity a week and strengthening exercises 2-3 times per week for cardiovascular health, weight loss maintenance and preservation of muscle mass.   She has agreed to [employer] strengthening exercises with a goal of 2-3 sessions a week  Continue to track daily steps Plan to add in resistance training 2 days/ wk  Pharmacotherapy changes for the treatment of obesity: begin Wegovy  0.25 mg weekly injection Patient denies a personal or family history of pancreatitis, medullary thyroid  carcinoma or multiple endocrine neoplasia type II. Recommend reviewing pen training video online. Avoid pregnancy while on Wegovy  (she agrees to period tracking + condom use) Reviewed [email or web address] for more information  ASSOCIATED CONDITIONS ADDRESSED TODAY  Vitamin D  deficiency Last vitamin D  Lab Results  Component Value Date   VD25OH 23.3 (L) 05/04/2023  Refilled RX vitamin D  weekly Fatigue is unchanged Repeat lab next visit  -     Ergocalciferol ; Take 1 capsule (50,000 Units total) by mouth once a week.  Dispense: 12 capsule; Refill: 0  Class 3 severe obesity due to excess calories with body mass index (BMI)  of 45.0 to 49.9 in adult -     Wegovy ; Inject 0.25 mg into the skin once a week.  Dispense: 2 mL; Refill: 0 She is motivated to stay on cat 4 meal plan with the help of her mom With 0 lb of weight loss in 2 mos and a goal weight <200 lb, she is a good candidate to add on Wegovy  0.25 mg weekly  Prediabetes Lab Results  Component Value Date   HGBA1C 6.0 (H) 05/04/2023   Continue to work on limiting intake of added sugar and starches along with keeping up with walking. Repeat A1c and fasting insulin  next visit Consider adding metformin  Iron  deficiency anemia, unspecified iron  deficiency anemia type No results found for: IRON , TIBC, FERRITIN She c/o fatigue and had a mild anemia on her most recent CBC with Hgb 11.5 Menses are regular and sometimes heavy Check iron  levels with labs next visit OK to take a Women's MVI daily  Other fatigue She c/o fatigue though her most recent thyroid  level and B12 level were normal.  She claims to sleep well at night and has been taking RX vitamin D  weekly.   Plan to: check iron , vitamin D , folic acid level next visit Consider polysomnogram   She was informed of the importance of frequent follow up visits to maximize her success with intensive lifestyle modifications for her multiple health conditions.   ATTESTASTION STATEMENTS:  Reviewed by clinician on day of visit: allergies, medications, problem list, medical history, surgical history, family history, social history, and previous encounter notes pertinent to obesity diagnosis.   I have personally spent 30 minutes total time today in preparation, patient care, nutritional counseling and education,  and documentation for this visit, including the following: review of most recent clinical lab tests, prescribing medications/ refilling medications, reviewing medical assistant documentation, review and interpretation of bioimpedence results.     Krystal Hudson, D.O. DABFM, DABOM [facility] [address] [place], [place] [address] [phone]

## 2023-08-26 NOTE — Patient Instructions (Addendum)
 Plan: Start Wegovy  0.25 mg once weekly injection Check out [email or web address] pen training video  Aim for 10,000 steps/ day Cut out the regular sodas - Coke --> Coke ZERO  Women's multivitamin daily Vitamin D  RX weekly  Labs next visit

## 2023-08-26 NOTE — Telephone Encounter (Signed)
 Prior authorization re submitted via cover my meds for patients Wegovy . Waiting on determination.

## 2023-09-03 ENCOUNTER — Ambulatory Visit (INDEPENDENT_AMBULATORY_CARE_PROVIDER_SITE_OTHER): Admitting: Family Medicine

## 2023-09-09 DIAGNOSIS — H5213 Myopia, bilateral: Secondary | ICD-10-CM | POA: Diagnosis not present

## 2023-09-23 ENCOUNTER — Encounter: Payer: Self-pay | Admitting: Family Medicine

## 2023-09-23 ENCOUNTER — Ambulatory Visit: Admitting: Family Medicine

## 2023-09-23 VITALS — BP 117/79 | HR 81 | Temp 98.8°F | Ht 65.0 in | Wt 285.0 lb

## 2023-09-23 DIAGNOSIS — D509 Iron deficiency anemia, unspecified: Secondary | ICD-10-CM | POA: Diagnosis not present

## 2023-09-23 DIAGNOSIS — R7303 Prediabetes: Secondary | ICD-10-CM | POA: Diagnosis not present

## 2023-09-23 DIAGNOSIS — E66813 Obesity, class 3: Secondary | ICD-10-CM

## 2023-09-23 DIAGNOSIS — Z6841 Body Mass Index (BMI) 40.0 and over, adult: Secondary | ICD-10-CM | POA: Diagnosis not present

## 2023-09-23 DIAGNOSIS — E559 Vitamin D deficiency, unspecified: Secondary | ICD-10-CM | POA: Diagnosis not present

## 2023-09-23 MED ORDER — WEGOVY 0.5 MG/0.5ML ~~LOC~~ SOAJ: 0.5000 mg | SUBCUTANEOUS | 0 refills | Status: DC

## 2023-09-23 NOTE — Progress Notes (Signed)
 Office: [phone]  /  Fax: [phone]  WEIGHT SUMMARY AND BIOMETRICS  Starting Date: 06/16/23  Starting Weight: 290lb   Weight Lost Since Last Visit: 5lb   Vitals Temp: 98.8 F (37.1 C) BP: 117/79 Pulse Rate: 81 SpO2: 99 %   Body Composition  Body Fat %: 45.9 % Fat Mass (lbs): 131.2 lbs Muscle Mass (lbs): 146.8 lbs Total Body Water (lbs): 101.4 lbs Visceral Fat Rating : 13   HPI  Chief Complaint: OBESITY  Krystal Hudson is here to discuss her progress with her obesity treatment plan. She is on the the Category 4 Plan and states she is following her eating plan approximately 90 % of the time. She states she is exercising 60 minutes 3 times per week.  Interval History:  Since last office visit she is down 5 lb She is doing well with new start Wegovy  0.25 mg weekly Her net weight loss is now 5 lb in 3 mos Going to the gym 3 days/ wk doing cardio and weight training  She swapped out coke for coke zero and increased water intake She started tracking daily steps  Pharmacotherapy: Wegovy  0.25 mg weekly  PHYSICAL EXAM:  Blood pressure 117/79, pulse 81, temperature 98.8 F (37.1 C), height 5' 5 (1.651 m), weight 285 lb (129.3 kg), last menstrual period 09/06/2023, SpO2 99%, not currently breastfeeding. Body mass index is 47.43 kg/m.  General: She is overweight, cooperative, alert, well developed, and in no acute distress. PSYCH: Has normal mood, affect and thought process.   Lungs: Normal breathing effort, no conversational dyspnea.  ASSESSMENT AND PLAN  TREATMENT PLAN FOR OBESITY:  Recommended Dietary Goals  Krystal Hudson is currently in the action stage of change. As such, her goal is to continue weight management plan. She has agreed to keeping a food journal and adhering to recommended goals of 1600 calories and 100 g of  protein and practicing portion control and making smarter food choices, such as increasing vegetables and decreasing simple  carbohydrates.  Behavioral Intervention  We discussed the following Behavioral Modification Strategies today: increasing lean protein intake to established goals, increasing fiber rich foods, increasing water intake , work on meal planning and preparation, work on Counselling psychologist calories using tracking application, reading food labels , keeping healthy foods at home, work on managing stress, creating time for self-care and relaxation, avoiding temptations and identifying enticing environmental cues, and continue to work on maintaining a reduced calorie state, getting the recommended amount of protein, incorporating whole foods, making healthy choices, staying well hydrated and practicing mindfulness when eating..  Additional resources provided today: NA  Recommended Physical Activity Goals  Krystal Hudson has been advised to work up to 150 minutes of moderate intensity aerobic activity a week and strengthening exercises 2-3 times per week for cardiovascular health, weight loss maintenance and preservation of muscle mass.   She has agreed to Increase the intensity, frequency or duration of strengthening exercises  and Increase the intensity, frequency or duration of aerobic exercises    Pharmacotherapy changes for the treatment of obesity: increase Wegovy  to 0.5 mg weekly  ASSOCIATED CONDITIONS ADDRESSED TODAY  Vitamin D  deficiency Last vitamin D  Lab Results  Component Value Date   VD25OH 23.3 (L) 05/04/2023   Update labs today Taking RX vitamin D  weekly -     VITAMIN D  25 Hydroxy (Vit-D Deficiency, Fractures)  Prediabetes Lab Results  Component Value Date   HGBA1C 6.0 (H) 05/04/2023   Doing better with dietary change, exercise and the addition  of Wegovy  -     Hemoglobin A1c -     Comprehensive metabolic panel with GFR  Iron  deficiency anemia, unspecified iron  deficiency anemia type No results found for: IRON , TIBC, FERRITIN Not on an iron  supplement daily Denies heavy  menses C/o fatigue with numbness in hands and feet.  Labs today and recommend PCP follow up -     Ferritin -     Iron  and TIBC  Class 3 severe obesity due to excess calories with body mass index (BMI) of 45.0 to 49.9 in adult -     Wegovy ; Inject 0.5 mg into the skin once a week.  Dispense: 2 mL; Refill: 0 Tolerating new start Wegovy  without GI upset or meal skipping Increase to 0.5 mg weekly     She was informed of the importance of frequent follow up visits to maximize her success with intensive lifestyle modifications for her multiple health conditions.   ATTESTASTION STATEMENTS:  Reviewed by clinician on day of visit: allergies, medications, problem list, medical history, surgical history, family history, social history, and previous encounter notes pertinent to obesity diagnosis.   I have personally spent 30 minutes total time today in preparation, patient care, nutritional counseling and education,  and documentation for this visit, including the following: review of most recent clinical lab tests, prescribing medications/ refilling medications, reviewing medical assistant documentation, review and interpretation of bioimpedence results.     Darice Haddock, D.O. DABFM, DABOM [facility] [address] [place], [place] [address] [phone]

## 2023-09-24 ENCOUNTER — Ambulatory Visit: Payer: Self-pay | Admitting: Family Medicine

## 2023-09-24 LAB — COMPREHENSIVE METABOLIC PANEL WITH GFR
ALT: 23 IU/L (ref 0–32)
AST: 21 IU/L (ref 0–40)
Albumin: 4.2 g/dL (ref 4.0–5.0)
Alkaline Phosphatase: 80 IU/L (ref 44–121)
BUN/Creatinine Ratio: 16 (ref 9–23)
BUN: 11 mg/dL (ref 6–20)
Bilirubin Total: 0.2 mg/dL (ref 0.0–1.2)
CO2: 20 mmol/L (ref 20–29)
Calcium: 9.1 mg/dL (ref 8.7–10.2)
Chloride: 104 mmol/L (ref 96–106)
Creatinine, Ser: 0.69 mg/dL (ref 0.57–1.00)
Globulin, Total: 2.3 g/dL (ref 1.5–4.5)
Glucose: 78 mg/dL (ref 70–99)
Potassium: 4.3 mmol/L (ref 3.5–5.2)
Sodium: 140 mmol/L (ref 134–144)
Total Protein: 6.5 g/dL (ref 6.0–8.5)
eGFR: 121 mL/min/1.73 (ref >59)

## 2023-09-24 LAB — IRON AND TIBC
Iron Saturation: 24 % (ref 15–55)
Iron: 89 ug/dL (ref 27–159)
Total Iron Binding Capacity: 370 ug/dL (ref 250–450)
UIBC: 281 ug/dL (ref 131–425)

## 2023-09-24 LAB — FERRITIN: Ferritin: 52 ng/mL (ref 15–150)

## 2023-09-24 LAB — VITAMIN D 25 HYDROXY (VIT D DEFICIENCY, FRACTURES): Vit D, 25-Hydroxy: 32.3 ng/mL (ref 30.0–100.0)

## 2023-09-24 LAB — HEMOGLOBIN A1C
Est. average glucose Bld gHb Est-mCnc: 117 mg/dL
Hgb A1c MFr Bld: 5.7 % — ABNORMAL HIGH (ref 4.8–5.6)

## 2023-09-25 ENCOUNTER — Telehealth: Payer: Self-pay | Admitting: Family Medicine

## 2023-09-25 NOTE — Telephone Encounter (Signed)
 Pt confirmed appt 8/8

## 2023-09-28 ENCOUNTER — Ambulatory Visit: Admitting: Family Medicine

## 2023-09-29 ENCOUNTER — Ambulatory Visit (INDEPENDENT_AMBULATORY_CARE_PROVIDER_SITE_OTHER): Admitting: Family Medicine

## 2023-10-13 ENCOUNTER — Ambulatory Visit: Admitting: Family Medicine

## 2023-10-13 ENCOUNTER — Encounter: Payer: Self-pay | Admitting: Family Medicine

## 2023-10-13 VITALS — BP 112/78 | HR 80 | Temp 98.6°F | Ht 65.0 in | Wt 277.0 lb

## 2023-10-13 DIAGNOSIS — E66813 Obesity, class 3: Secondary | ICD-10-CM | POA: Diagnosis not present

## 2023-10-13 DIAGNOSIS — R7303 Prediabetes: Secondary | ICD-10-CM | POA: Diagnosis not present

## 2023-10-13 DIAGNOSIS — Z6841 Body Mass Index (BMI) 40.0 and over, adult: Secondary | ICD-10-CM

## 2023-10-13 DIAGNOSIS — K760 Fatty (change of) liver, not elsewhere classified: Secondary | ICD-10-CM | POA: Diagnosis not present

## 2023-10-13 DIAGNOSIS — E559 Vitamin D deficiency, unspecified: Secondary | ICD-10-CM | POA: Diagnosis not present

## 2023-10-13 MED ORDER — WEGOVY 1 MG/0.5ML ~~LOC~~ SOAJ: 1.0000 mg | SUBCUTANEOUS | 0 refills | Status: DC

## 2023-10-13 MED ORDER — ERGOCALCIFEROL 1.25 MG (50000 UT) PO CAPS: 50000.0000 [IU] | ORAL_CAPSULE | ORAL | 0 refills | Status: AC

## 2023-10-13 NOTE — Progress Notes (Signed)
 Office: [phone]  /  Fax: [phone]  WEIGHT SUMMARY AND BIOMETRICS  Starting Date: 06/16/23  Starting Weight: 290lb   Weight Lost Since Last Visit: 8lb   Vitals Temp: 98.6 F (37 C) BP: 112/78 Pulse Rate: 80 SpO2: 96 %   Body Composition  Body Fat %: 43.9 % Fat Mass (lbs): 121.6 lbs Muscle Mass (lbs): 147.6 lbs Total Body Water (lbs): 102.2 lbs Visceral Fat Rating : 12     HPI  Chief Complaint: OBESITY  Krystal Hudson is here to discuss her progress with her obesity treatment plan. She is on the the Category 4 Plan and states she is following her eating plan approximately 90 % of the time. She states she is exercising 60 minutes 3 times per week.   Interval History:  Since last office visit she is down 8 lb She is up 0.8 lb of muscle mass and down 9.6 lb of body fat since last visit She has a net weight loss of 13 lb in 3.5 mos of medically supervised weight management This is a 4.5% TBW loss She is eating smaller portion sizes and is more mindful of food choices She denies meal skipping She is doing cardio and weight training 3 days/ wk She is working on hydrating well Doing well on Wegovy   Pharmacotherapy: Wegovy  0.5 mg weekly  PHYSICAL EXAM:  Blood pressure 112/78, pulse 80, temperature 98.6 F (37 C), height 5' 5 (1.651 m), weight 277 lb (125.6 kg), last menstrual period 09/06/2023, SpO2 96%. Body mass index is 46.1 kg/m.  General: She is overweight, cooperative, alert, well developed, and in no acute distress. PSYCH: Has normal mood, affect and thought process.   Lungs: Normal breathing effort, no conversational dyspnea.   ASSESSMENT AND PLAN  TREATMENT PLAN FOR OBESITY:  Recommended Dietary Goals  Krystal Hudson is currently in the action stage of change. As such, her goal is to continue weight management plan. She has agreed to the Category 4 Plan.  Behavioral Intervention  We discussed the following Behavioral Modification Strategies today:  increasing lean protein intake to established goals, increasing fiber rich foods, avoiding skipping meals, increasing water intake , work on meal planning and preparation, keeping healthy foods at home, identifying sources and decreasing liquid calories, avoiding temptations and identifying enticing environmental cues, planning for success, continue to work on maintaining a reduced calorie state, getting the recommended amount of protein, incorporating whole foods, making healthy choices, staying well hydrated and practicing mindfulness when eating., and increase protein intake, fibrous foods (25 grams per day for women, 30 grams for men) and water to improve satiety and decrease hunger signals. .  Additional resources provided today: NA  Recommended Physical Activity Goals  Krystal Hudson has been advised to work up to 150 minutes of moderate intensity aerobic activity a week and strengthening exercises 2-3 times per week for cardiovascular health, weight loss maintenance and preservation of muscle mass.   She has agreed to Increase the intensity, frequency or duration of strengthening exercises  and Increase the intensity, frequency or duration of aerobic exercises    Pharmacotherapy changes for the treatment of obesity: increase Wegovy  to 1 mg weekly  ASSOCIATED CONDITIONS ADDRESSED TODAY  Prediabetes Lab Results  Component Value Date   HGBA1C 5.7 (H) 09/23/2023  Improving Reviewed labs with patient from last visit A1c improved from 6--> 5.7 Continue prescribed diet, regular workouts (great job) and use of Wegovy   Class 3 severe obesity due to excess calories with body mass index (BMI) of 45.0  to 49.9 in adult -     Wegovy ; Inject 1 mg into the skin once a week.  Dispense: 2 mL; Refill: 0 With no GI upset or meal skipping on Wegovy  0.5 mg, will increase next RX to 1 mg weekly Great job with lean protein and fiber with meals Can use OTC Miralax prn constipating SE Increase water intake to 100  oz/ day  Vitamin D  deficiency Last vitamin D  Lab Results  Component Value Date   VD25OH 32.3 09/23/2023   Improving.  Reviewed lab results with patient from last visit.  Vitamin D  level improved from 23.3--> 32.2 on RX vitamin D  weekly.  Energy level improving.  Continue RX vitamin D  weekly with a goal > 50 -     Ergocalciferol ; Take 1 capsule (50,000 Units total) by mouth once a week.  Dispense: 12 capsule; Refill: 0  Hepatic steatosis LFTs normal on 8/6 CMP Continue active plan for weight reduction Visceral fat rating is improving with a goal <10 and a body fat % goal < 35    She was informed of the importance of frequent follow up visits to maximize her success with intensive lifestyle modifications for her multiple health conditions.   ATTESTASTION STATEMENTS:  Reviewed by clinician on day of visit: allergies, medications, problem list, medical history, surgical history, family history, social history, and previous encounter notes pertinent to obesity diagnosis.   I have personally spent 30 minutes total time today in preparation, patient care, nutritional counseling and education,  and documentation for this visit, including the following: review of most recent clinical lab tests, prescribing medications/ refilling medications, reviewing medical assistant documentation, review and interpretation of bioimpedence results.     Darice Haddock, D.O. DABFM, DABOM [facility] [address] [place], [place] [address] [phone]

## 2023-10-15 ENCOUNTER — Ambulatory Visit: Admitting: Family Medicine

## 2023-11-02 ENCOUNTER — Ambulatory Visit: Admitting: Family Medicine

## 2023-11-02 ENCOUNTER — Encounter: Payer: Self-pay | Admitting: Family Medicine

## 2023-11-02 VITALS — BP 127/79 | HR 71 | Temp 98.0°F | Ht 65.0 in | Wt 275.0 lb

## 2023-11-02 DIAGNOSIS — E66813 Obesity, class 3: Secondary | ICD-10-CM

## 2023-11-02 DIAGNOSIS — E559 Vitamin D deficiency, unspecified: Secondary | ICD-10-CM | POA: Diagnosis not present

## 2023-11-02 DIAGNOSIS — K5903 Drug induced constipation: Secondary | ICD-10-CM

## 2023-11-02 DIAGNOSIS — R7303 Prediabetes: Secondary | ICD-10-CM

## 2023-11-02 DIAGNOSIS — Z6841 Body Mass Index (BMI) 40.0 and over, adult: Secondary | ICD-10-CM | POA: Diagnosis not present

## 2023-11-02 DIAGNOSIS — K760 Fatty (change of) liver, not elsewhere classified: Secondary | ICD-10-CM | POA: Diagnosis not present

## 2023-11-02 MED ORDER — WEGOVY 1.7 MG/0.75ML ~~LOC~~ SOAJ: 1.7000 mg | SUBCUTANEOUS | 0 refills | Status: AC

## 2023-11-02 NOTE — Patient Instructions (Addendum)
 Add in an apple or 2 prunes during the day  You can have more than 2 cups of veggies with dinner  Get in at least 64 oz of water daily Add in a serving of Metamucil sugar free with water daily  Stay on Miralax 1-2 x a day Add in a stool softener (Colace) if needed  [place] job with gym workouts! You can trade out Lauraine Ruth Delightful bread with thin sliced DAVE's killer bread for more fiber

## 2023-11-02 NOTE — Progress Notes (Signed)
 Office: [phone]  /  Fax: [phone]  WEIGHT SUMMARY AND BIOMETRICS  Starting Date: 06/16/23  Starting Weight: 290lb   Weight Lost Since Last Visit: 2lb   Vitals Temp: 98 F (36.7 C) BP: 127/79 Pulse Rate: 71 SpO2: 97 %   Body Composition  Body Fat %: 49.3 % Fat Mass (lbs): 135.6 lbs Muscle Mass (lbs): 132.6 lbs Total Body Water (lbs): 96 lbs Visceral Fat Rating : 14    HPI  Chief Complaint: OBESITY  Krystal Hudson is here to discuss her progress with her obesity treatment plan. She is on the the Category 4 Plan and states she is following her eating plan approximately 90 % of the time. She states she is exercising 60 minutes 3 times per week.   Interval History:  Since last office visit she is down 2 lb This gives her a net weight loss of 15 lb in 4 mos of medically supervised weight management This is a 5% total body weight loss She has been a bit more constipated esp since going up on Wegovy  to 1 mg dose Has done 3 injections of Wegovy  1 mg dose Denies nausea but has had some heartburn, not needing to take anything Food noise and portions have reduced She is doing cardio at the gym and doing weights 3 x a week She also has a fairly active job  24 hr verball recall: 2 slices of wheat bread with peanut butter and jelly Sandwich - lettuce/ tomatoes/ ham/ avocado Chicken/ rice/ veggies (making dinner at home) No snacks/ no sweets Drinks: water, coke zero, gzero  Pharmacotherapy: Wegovy  1 mg injection weekly  PHYSICAL EXAM:  Blood pressure 127/79, pulse 71, temperature 98 F (36.7 C), height 5' 5 (1.651 m), weight 275 lb (124.7 kg), SpO2 97%. Body mass index is 45.76 kg/m.  General: She is overweight, cooperative, alert, well developed, and in no acute distress. PSYCH: Has normal mood, affect and thought process.   Lungs: Normal breathing effort, no conversational dyspnea.   ASSESSMENT AND PLAN  TREATMENT PLAN FOR OBESITY:  Recommended Dietary  Goals  Krystal Hudson is currently in the action stage of change. As such, her goal is to continue weight management plan. She has agreed to the Category 4 Plan.  Behavioral Intervention  We discussed the following Behavioral Modification Strategies today: increasing lean protein intake to established goals, increasing vegetables, increasing lower glycemic fruits, increasing water intake , work on meal planning and preparation, keeping healthy foods at home, identifying sources and decreasing liquid calories, avoiding temptations and identifying enticing environmental cues, and planning for success.  Additional resources provided today: NA  Recommended Physical Activity Goals  Krystal Hudson has been advised to work up to 150 minutes of moderate intensity aerobic activity a week and strengthening exercises 2-3 times per week for cardiovascular health, weight loss maintenance and preservation of muscle mass.   She has agreed to Increase the intensity, frequency or duration of aerobic exercises    Pharmacotherapy changes for the treatment of obesity: Increase Wegovy  to 1.7 mg once weekly injection  ASSOCIATED CONDITIONS ADDRESSED TODAY  Drug-induced constipation Worsening with Wegovy  use.  According to her 24-hour food recall, she is getting a little intake of fruits and vegetables.  We discussed increasing water intake to at least 64 ounces per day, adding in either a full alcohol or 2 prunes daily and Metamucil mixed with water once daily.  In addition, she may continue MiraLAX 1-2 times daily as she has currently been doing and use Colace  1-2 times a day if needed for stool softening.  Class 3 severe obesity due to excess calories with body mass index (BMI) of 45.0 to 49.9 in adult -     Wegovy ; Inject 1.7 mg into the skin once a week.  Dispense: 3 mL; Refill: 0 She is not having much GI upset other than constipation from Wegovy  use.  She denies purposeful meal skipping and has had improved food volumes  and a reduction of food noise with the help of Wegovy .  We discussed the importance of ramping up exercise to include both cardio and resistance training at least 3 days a week  Hepatic steatosis Findings of hepatic steatosis present on CT scan dated 07/19/2023, moderate in nature.  She does not meet criteria for fib 4 score given her age under 59.  Continue active plan for weight reduction  Prediabetes Lab Results  Component Value Date   HGBA1C 5.7 (H) 09/23/2023  Continue active plan for weight reduction, reducing added sugar and refined carbohydrates.  Ramp-up exercise frequency.  Repeat A1c in the next 2 months.  Vitamin D  deficiency Last vitamin D  Lab Results  Component Value Date   VD25OH 32.3 09/23/2023  Doing well on vitamin D  50,000 IU once weekly with a goal over 50.  Repeat vitamin D  level in 2 months    She was informed of the importance of frequent follow up visits to maximize her success with intensive lifestyle modifications for her multiple health conditions.   ATTESTASTION STATEMENTS:  Reviewed by clinician on day of visit: allergies, medications, problem list, medical history, surgical history, family history, social history, and previous encounter notes pertinent to obesity diagnosis.    Darice Haddock, D.O. DABFM, DABOM [facility] [address] [place], [place] [address] [phone]

## 2023-12-02 ENCOUNTER — Ambulatory Visit: Payer: Self-pay | Admitting: Family Medicine

## 2023-12-07 ENCOUNTER — Encounter: Admitting: Internal Medicine

## 2023-12-07 NOTE — Progress Notes (Deleted)
 Office Visit Note  Patient: Krystal Hudson             Date of Birth: 1995/04/16           MRN: [id number]             PCP: Delbert Clam, MD Referring: Delbert Clam, MD Visit Date: 12/07/2023 Occupation: Data Unavailable  Subjective:  No chief complaint on file.   History of Present Illness: Krystal Hudson is a 28 y.o. female ***     Activities of Daily Living:  Patient reports morning stiffness for *** {minute/hour:19697}.   Patient {ACTIONS;DENIES/REPORTS:21021675::Denies} nocturnal pain.  Difficulty dressing/grooming: {ACTIONS;DENIES/REPORTS:21021675::Denies} Difficulty climbing stairs: {ACTIONS;DENIES/REPORTS:21021675::Denies} Difficulty getting out of chair: {ACTIONS;DENIES/REPORTS:21021675::Denies} Difficulty using hands for taps, buttons, cutlery, and/or writing: {ACTIONS;DENIES/REPORTS:21021675::Denies}  No Rheumatology ROS completed.   PMFS History:  Patient Active Problem List   Diagnosis Date Noted   Hyperlipidemia 06/16/2023   Vitamin D  deficiency 05/05/2023   Abnormal TSH 05/05/2023   Prediabetes 05/05/2023   S/P cesarean section 09/17/2021   Vacuum-assisted cesarean delivery, delivered, current hospitalization 09/17/2021   Status post repeat low transverse cesarean section 09/17/2021   Alpha thalassemia silent carrier 07/02/2021   Hepatic steatosis 05/26/2021   History of cesarean section complicating pregnancy 05/21/2021   Supervision of normal pregnancy 03/26/2021   Morbid obesity (HCC) 11/18/2018    Past Medical History:  Diagnosis Date   Anemia    Headache    Hepatic steatosis    High cholesterol    Hypertension    Morbid obesity (HCC)    Prediabetes    Vitamin D  deficiency     Family History  Problem Relation Age of Onset   Healthy Mother    Anxiety disorder Mother    Bipolar disorder Father    Hypertension Maternal Grandmother    Hypertension Maternal Grandfather    Asthma Neg Hx    Cancer Neg Hx    Diabetes Neg Hx     Heart disease Neg Hx    Stroke Neg Hx    Past Surgical History:  Procedure Laterality Date   CESAREAN SECTION N/A 11/20/2018   Procedure: CESAREAN SECTION;  Surgeon: Starla Harland BROCKS, MD;  Location: [facility];  Service: Obstetrics;  Laterality: N/A;   CESAREAN SECTION N/A 09/17/2021   Procedure: CESAREAN SECTION;  Surgeon: Barbra Lang PARAS, DO;  Location: [facility];  Service: Obstetrics;  Laterality: N/A;   LAPAROSCOPIC APPENDECTOMY N/A 07/19/2023   Procedure: APPENDECTOMY, LAPAROSCOPIC;  Surgeon: Rubin Calamity, MD;  Location: [facility];  Service: General;  Laterality: N/A;   TYMPANOSTOMY TUBE PLACEMENT     Social History   Tobacco Use   Smoking status: Never   Smokeless tobacco: Never  Vaping Use   Vaping status: Former   Quit date: 04/17/2018  Substance Use Topics   Alcohol use: Not Currently   Drug use: Never   Social History   Social History Narrative   Not on file     Immunization History  Administered Date(s) Administered   Influenza,inj,Quad PF,6+ Mos 11/21/2018   Tdap 08/25/2018     Objective: Vital Signs: There were no vitals taken for this visit.   Physical Exam   Musculoskeletal Exam: ***  CDAI Exam: CDAI Score: -- Patient Global: --; Provider Global: -- Swollen: --; Tender: -- Joint Exam 12/07/2023   No joint exam has been documented for this visit   There is currently no information documented on the homunculus. Go to the Rheumatology activity and complete the homunculus joint exam.  Investigation: No additional findings.  Imaging: No results found.  Recent Labs: Lab Results  Component Value Date   WBC 8.2 07/21/2023   HGB 11.5 (L) 07/21/2023   PLT 260 07/21/2023   NA 140 09/23/2023   K 4.3 09/23/2023   CL 104 09/23/2023   CO2 20 09/23/2023   GLUCOSE 78 09/23/2023   BUN 11 09/23/2023   CREATININE 0.69 09/23/2023   BILITOT 0.2 09/23/2023   ALKPHOS 80 09/23/2023   AST 21 09/23/2023   ALT 23 09/23/2023   PROT 6.5 09/23/2023   ALBUMIN  4.2 09/23/2023   CALCIUM 9.1 09/23/2023   GFRAA >60 11/18/2018    Speciality Comments: No specialty comments available.  Procedures:  No procedures performed Allergies: Patient has no known allergies.   Assessment / Plan:     Visit Diagnoses: No diagnosis found.  Orders: No orders of the defined types were placed in this encounter.  No orders of the defined types were placed in this encounter.   Face-to-face time spent with patient was *** minutes. Greater than 50% of time was spent in counseling and coordination of care.  Follow-Up Instructions: No follow-ups on file.   Lonni LELON Ester, MD  Note - This record has been created using [employer].  Chart creation errors have been sought, but may not always  have been located. Such creation errors do not reflect on  the standard of medical care.

## 2024-05-03 ENCOUNTER — Encounter: Admitting: Family Medicine
